# Patient Record
Sex: Female | Born: 1990
Health system: Southern US, Community
[De-identification: ages and names within clinical notes are randomized; demographics above are authoritative.]

## PROBLEM LIST (undated history)

## (undated) ENCOUNTER — Inpatient Hospital Stay (HOSPITAL_COMMUNITY): Payer: Self-pay

## (undated) ENCOUNTER — Emergency Department: Admission: EM | Payer: Managed Care, Other (non HMO) | Source: Home / Self Care

## (undated) DIAGNOSIS — K5190 Ulcerative colitis, unspecified, without complications: Secondary | ICD-10-CM

## (undated) DIAGNOSIS — M1990 Unspecified osteoarthritis, unspecified site: Secondary | ICD-10-CM

## (undated) DIAGNOSIS — K219 Gastro-esophageal reflux disease without esophagitis: Secondary | ICD-10-CM

## (undated) DIAGNOSIS — Z8759 Personal history of other complications of pregnancy, childbirth and the puerperium: Secondary | ICD-10-CM

## (undated) DIAGNOSIS — J42 Unspecified chronic bronchitis: Secondary | ICD-10-CM

## (undated) DIAGNOSIS — F909 Attention-deficit hyperactivity disorder, unspecified type: Secondary | ICD-10-CM

## (undated) DIAGNOSIS — F4310 Post-traumatic stress disorder, unspecified: Secondary | ICD-10-CM

## (undated) DIAGNOSIS — F319 Bipolar disorder, unspecified: Secondary | ICD-10-CM

## (undated) DIAGNOSIS — D649 Anemia, unspecified: Secondary | ICD-10-CM

## (undated) DIAGNOSIS — J45909 Unspecified asthma, uncomplicated: Secondary | ICD-10-CM

## (undated) DIAGNOSIS — T7840XA Allergy, unspecified, initial encounter: Secondary | ICD-10-CM

## (undated) DIAGNOSIS — F329 Major depressive disorder, single episode, unspecified: Secondary | ICD-10-CM

## (undated) DIAGNOSIS — F419 Anxiety disorder, unspecified: Secondary | ICD-10-CM

## (undated) DIAGNOSIS — F32A Depression, unspecified: Secondary | ICD-10-CM

## (undated) HISTORY — PX: TONSILECTOMY/ADENOIDECTOMY WITH MYRINGOTOMY: SHX6125

## (undated) HISTORY — DX: Anxiety disorder, unspecified: F41.9

## (undated) HISTORY — DX: Personal history of other complications of pregnancy, childbirth and the puerperium: Z87.59

## (undated) HISTORY — DX: Allergy, unspecified, initial encounter: T78.40XA

## (undated) HISTORY — DX: Unspecified osteoarthritis, unspecified site: M19.90

## (undated) HISTORY — PX: TONSILLECTOMY AND ADENOIDECTOMY: SHX28

---
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
## 2008-09-21 ENCOUNTER — Emergency Department: Payer: Self-pay

## 2008-09-22 ENCOUNTER — Emergency Department: Payer: Self-pay | Admitting: Emergency Medicine

## 2009-01-01 ENCOUNTER — Emergency Department: Payer: Self-pay | Admitting: Emergency Medicine

## 2009-03-05 ENCOUNTER — Emergency Department: Payer: Self-pay | Admitting: Emergency Medicine

## 2009-04-17 ENCOUNTER — Inpatient Hospital Stay: Payer: Self-pay | Admitting: Psychiatry

## 2009-05-22 ENCOUNTER — Emergency Department: Payer: Self-pay | Admitting: Emergency Medicine

## 2010-02-13 ENCOUNTER — Emergency Department: Payer: Self-pay | Admitting: Emergency Medicine

## 2010-06-12 ENCOUNTER — Emergency Department: Payer: Self-pay | Admitting: Emergency Medicine

## 2010-07-15 ENCOUNTER — Emergency Department: Payer: Self-pay | Admitting: Emergency Medicine

## 2010-07-18 ENCOUNTER — Emergency Department: Payer: Self-pay | Admitting: Emergency Medicine

## 2010-09-08 ENCOUNTER — Inpatient Hospital Stay: Payer: Self-pay | Admitting: Psychiatry

## 2011-02-03 ENCOUNTER — Emergency Department: Payer: Self-pay | Admitting: Emergency Medicine

## 2012-07-03 ENCOUNTER — Emergency Department: Payer: Self-pay | Admitting: Emergency Medicine

## 2013-09-30 ENCOUNTER — Emergency Department: Payer: Self-pay | Admitting: Emergency Medicine

## 2013-12-26 ENCOUNTER — Emergency Department: Payer: Self-pay | Admitting: Emergency Medicine

## 2013-12-26 LAB — COMPREHENSIVE METABOLIC PANEL
ALBUMIN: 3.4 g/dL (ref 3.4–5.0)
ALK PHOS: 35 U/L — AB
ANION GAP: 7 (ref 7–16)
BUN: 12 mg/dL (ref 7–18)
Bilirubin,Total: 0.3 mg/dL (ref 0.2–1.0)
CHLORIDE: 109 mmol/L — AB (ref 98–107)
CREATININE: 0.48 mg/dL — AB (ref 0.60–1.30)
Calcium, Total: 8.8 mg/dL (ref 8.5–10.1)
Co2: 22 mmol/L (ref 21–32)
EGFR (African American): >60
GLUCOSE: 84 mg/dL (ref 65–99)
Osmolality: 275 (ref 275–301)
POTASSIUM: 4.2 mmol/L (ref 3.5–5.1)
SGOT(AST): 30 U/L (ref 15–37)
SGPT (ALT): 20 U/L (ref 12–78)
Sodium: 138 mmol/L (ref 136–145)
TOTAL PROTEIN: 7.8 g/dL (ref 6.4–8.2)

## 2013-12-26 LAB — LIPASE, BLOOD: Lipase: 133 U/L (ref 73–393)

## 2013-12-26 LAB — CBC
HCT: 38.6 % (ref 35.0–47.0)
HGB: 12.6 g/dL (ref 12.0–16.0)
MCH: 29.7 pg (ref 26.0–34.0)
MCHC: 32.6 g/dL (ref 32.0–36.0)
MCV: 91 fL (ref 80–100)
PLATELETS: 236 10*3/uL (ref 150–440)
RBC: 4.24 10*6/uL (ref 3.80–5.20)
RDW: 12.8 % (ref 11.5–14.5)
WBC: 4.9 10*3/uL (ref 3.6–11.0)

## 2013-12-26 LAB — HCG, QUANTITATIVE, PREGNANCY: Beta Hcg, Quant.: <1 m[IU]/mL — ABNORMAL LOW

## 2014-06-13 ENCOUNTER — Emergency Department: Payer: Self-pay | Admitting: Emergency Medicine

## 2014-06-13 LAB — BASIC METABOLIC PANEL
ANION GAP: 9 (ref 7–16)
BUN: 16 mg/dL (ref 7–18)
CALCIUM: 8.6 mg/dL (ref 8.5–10.1)
CHLORIDE: 110 mmol/L — AB (ref 98–107)
Co2: 22 mmol/L (ref 21–32)
Creatinine: 0.75 mg/dL (ref 0.60–1.30)
EGFR (Non-African Amer.): >60
GLUCOSE: 96 mg/dL (ref 65–99)
Osmolality: 282 (ref 275–301)
POTASSIUM: 3.9 mmol/L (ref 3.5–5.1)
Sodium: 141 mmol/L (ref 136–145)

## 2014-06-13 LAB — CBC WITH DIFFERENTIAL/PLATELET
BASOS PCT: 0.7 %
Basophil #: 0.1 10*3/uL (ref 0.0–0.1)
EOS PCT: 4.1 %
Eosinophil #: 0.3 10*3/uL (ref 0.0–0.7)
HCT: 37.2 % (ref 35.0–47.0)
HGB: 12 g/dL (ref 12.0–16.0)
LYMPHS ABS: 2.5 10*3/uL (ref 1.0–3.6)
Lymphocyte %: 31.4 %
MCH: 30.3 pg (ref 26.0–34.0)
MCHC: 32.3 g/dL (ref 32.0–36.0)
MCV: 94 fL (ref 80–100)
Monocyte #: 0.5 x10 3/mm (ref 0.2–0.9)
Monocyte %: 6.6 %
NEUTROS PCT: 57.2 %
Neutrophil #: 4.5 10*3/uL (ref 1.4–6.5)
PLATELETS: 312 10*3/uL (ref 150–440)
RBC: 3.96 10*6/uL (ref 3.80–5.20)
RDW: 13.3 % (ref 11.5–14.5)
WBC: 7.9 10*3/uL (ref 3.6–11.0)

## 2014-06-13 LAB — URINALYSIS, COMPLETE
BLOOD: NEGATIVE
Bacteria: NONE SEEN
Bilirubin,UR: NEGATIVE
GLUCOSE, UR: NEGATIVE mg/dL (ref 0–75)
KETONE: NEGATIVE
LEUKOCYTE ESTERASE: NEGATIVE
Nitrite: NEGATIVE
PROTEIN: NEGATIVE
Ph: 5 (ref 4.5–8.0)
RBC,UR: NONE SEEN /HPF (ref 0–5)
SPECIFIC GRAVITY: 1.033 (ref 1.003–1.030)
WBC UR: NONE SEEN /HPF (ref 0–5)

## 2014-11-21 ENCOUNTER — Emergency Department: Payer: Self-pay | Admitting: Emergency Medicine

## 2014-11-21 LAB — URINALYSIS, COMPLETE
BILIRUBIN, UR: NEGATIVE
BLOOD: NEGATIVE
GLUCOSE, UR: NEGATIVE mg/dL (ref 0–75)
Ketone: NEGATIVE
Leukocyte Esterase: NEGATIVE
NITRITE: NEGATIVE
Ph: 7 (ref 4.5–8.0)
Protein: NEGATIVE
RBC,UR: 8 /HPF (ref 0–5)
SPECIFIC GRAVITY: 1.025 (ref 1.003–1.030)
Squamous Epithelial: 1

## 2014-11-21 LAB — CBC WITH DIFFERENTIAL/PLATELET
Basophil #: 0 10*3/uL (ref 0.0–0.1)
Basophil %: 0.7 %
EOS ABS: 0.3 10*3/uL (ref 0.0–0.7)
Eosinophil %: 4 %
HCT: 38.5 % (ref 35.0–47.0)
HGB: 13 g/dL (ref 12.0–16.0)
LYMPHS PCT: 29.7 %
Lymphocyte #: 2.2 10*3/uL (ref 1.0–3.6)
MCH: 30.6 pg (ref 26.0–34.0)
MCHC: 33.7 g/dL (ref 32.0–36.0)
MCV: 91 fL (ref 80–100)
MONO ABS: 0.7 x10 3/mm (ref 0.2–0.9)
Monocyte %: 9.3 %
NEUTROS PCT: 56.3 %
Neutrophil #: 4.2 10*3/uL (ref 1.4–6.5)
PLATELETS: 383 10*3/uL (ref 150–440)
RBC: 4.24 10*6/uL (ref 3.80–5.20)
RDW: 13.7 % (ref 11.5–14.5)
WBC: 7.4 10*3/uL (ref 3.6–11.0)

## 2014-11-21 LAB — BASIC METABOLIC PANEL
Anion Gap: 6 — ABNORMAL LOW (ref 7–16)
BUN: 11 mg/dL (ref 7–18)
CALCIUM: 8.9 mg/dL (ref 8.5–10.1)
CREATININE: 0.65 mg/dL (ref 0.60–1.30)
Chloride: 108 mmol/L — ABNORMAL HIGH (ref 98–107)
Co2: 26 mmol/L (ref 21–32)
EGFR (African American): >60
GLUCOSE: 96 mg/dL (ref 65–99)
Osmolality: 279 (ref 275–301)
POTASSIUM: 4 mmol/L (ref 3.5–5.1)
SODIUM: 140 mmol/L (ref 136–145)

## 2014-11-22 ENCOUNTER — Emergency Department: Payer: Self-pay | Admitting: Emergency Medicine

## 2014-12-24 ENCOUNTER — Emergency Department: Payer: Self-pay | Admitting: Emergency Medicine

## 2016-03-23 ENCOUNTER — Emergency Department: Payer: Self-pay

## 2016-03-23 ENCOUNTER — Emergency Department
Admission: EM | Admit: 2016-03-23 | Discharge: 2016-03-23 | Disposition: A | Discharge status: Home / Self Care | Payer: Self-pay | Attending: Emergency Medicine | Admitting: Emergency Medicine

## 2016-03-23 DIAGNOSIS — F329 Major depressive disorder, single episode, unspecified: Secondary | ICD-10-CM | POA: Insufficient documentation

## 2016-03-23 DIAGNOSIS — J45901 Unspecified asthma with (acute) exacerbation: Secondary | ICD-10-CM | POA: Insufficient documentation

## 2016-03-23 HISTORY — DX: Unspecified asthma, uncomplicated: J45.909

## 2016-03-23 HISTORY — DX: Gastro-esophageal reflux disease without esophagitis: K21.9

## 2016-03-23 HISTORY — DX: Depression, unspecified: F32.A

## 2016-03-23 HISTORY — DX: Major depressive disorder, single episode, unspecified: F32.9

## 2016-03-23 LAB — CBC WITH DIFFERENTIAL/PLATELET
BASOS PCT: 1 %
Basophils Absolute: 0.1 10*3/uL (ref 0–0.1)
EOS ABS: 0.5 10*3/uL (ref 0–0.7)
EOS PCT: 5 %
HCT: 36.4 % (ref 35.0–47.0)
Hemoglobin: 12.5 g/dL (ref 12.0–16.0)
Lymphocytes Relative: 35 %
Lymphs Abs: 3 10*3/uL (ref 1.0–3.6)
MCH: 30 pg (ref 26.0–34.0)
MCHC: 34.3 g/dL (ref 32.0–36.0)
MCV: 87.3 fL (ref 80.0–100.0)
MONO ABS: 0.6 10*3/uL (ref 0.2–0.9)
MONOS PCT: 8 %
Neutro Abs: 4.3 10*3/uL (ref 1.4–6.5)
Neutrophils Relative %: 51 %
PLATELETS: 361 10*3/uL (ref 150–440)
RBC: 4.16 MIL/uL (ref 3.80–5.20)
RDW: 14.5 % (ref 11.5–14.5)
WBC: 8.5 10*3/uL (ref 3.6–11.0)

## 2016-03-23 LAB — BASIC METABOLIC PANEL
Anion gap: 8 (ref 5–15)
BUN: 14 mg/dL (ref 6–20)
CALCIUM: 9.2 mg/dL (ref 8.9–10.3)
CO2: 21 mmol/L — AB (ref 22–32)
CREATININE: 0.64 mg/dL (ref 0.44–1.00)
Chloride: 111 mmol/L (ref 101–111)
GFR calc non Af Amer: >60 mL/min (ref >60)
Glucose, Bld: 119 mg/dL — ABNORMAL HIGH (ref 65–99)
Potassium: 3.7 mmol/L (ref 3.5–5.1)
SODIUM: 140 mmol/L (ref 135–145)

## 2016-03-23 MED ORDER — PREDNISONE 20 MG PO TABS: 40.0000 mg | ORAL_TABLET | Freq: Every day | ORAL | Status: DC

## 2016-03-23 MED ORDER — IPRATROPIUM-ALBUTEROL 0.5-2.5 (3) MG/3ML IN SOLN
3.0000 mL | Freq: Once | RESPIRATORY_TRACT | Status: AC
  Administered 2016-03-23: 3 mL via RESPIRATORY_TRACT
  Filled 2016-03-23: qty 3

## 2016-03-23 MED ORDER — IPRATROPIUM-ALBUTEROL 0.5-2.5 (3) MG/3ML IN SOLN
3.0000 mL | Freq: Once | RESPIRATORY_TRACT | Status: AC
  Administered 2016-03-23: 3 mL via RESPIRATORY_TRACT

## 2016-03-23 MED ORDER — METHYLPREDNISOLONE SODIUM SUCC 125 MG IJ SOLR
125.0000 mg | Freq: Once | INTRAMUSCULAR | Status: AC
  Administered 2016-03-23: 125 mg via INTRAVENOUS
  Filled 2016-03-23: qty 2

## 2016-03-23 NOTE — Discharge Instructions (Signed)
Please seek medical attention for any high fevers, chest pain, shortness of breath, change in behavior, persistent vomiting, bloody stool or any other new or concerning symptoms.   Asthma, Acute Bronchospasm Acute bronchospasm caused by asthma is also referred to as an asthma attack. Bronchospasm means your air passages become narrowed. The narrowing is caused by inflammation and tightening of the muscles in the air tubes (bronchi) in your lungs. This can make it hard to breathe or cause you to wheeze and cough. CAUSES Possible triggers are:  Animal dander from the skin, hair, or feathers of animals.  Dust mites contained in house dust.  Cockroaches.  Pollen from trees or grass.  Mold.  Cigarette or tobacco smoke.  Air pollutants such as dust, household cleaners, hair sprays, aerosol sprays, paint fumes, strong chemicals, or strong odors.  Cold air or weather changes. Cold air may trigger inflammation. Winds increase molds and pollens in the air.  Strong emotions such as crying or laughing hard.  Stress.  Certain medicines such as aspirin or beta-blockers.  Sulfites in foods and drinks, such as dried fruits and wine.  Infections or inflammatory conditions, such as a flu, cold, or inflammation of the nasal membranes (rhinitis).  Gastroesophageal reflux disease (GERD). GERD is a condition where stomach acid backs up into your esophagus.  Exercise or strenuous activity. SIGNS AND SYMPTOMS   Wheezing.  Excessive coughing, particularly at night.  Chest tightness.  Shortness of breath. DIAGNOSIS  Your health care provider will ask you about your medical history and perform a physical exam. A chest X-ray or blood testing may be performed to look for other causes of your symptoms or other conditions that may have triggered your asthma attack. TREATMENT  Treatment is aimed at reducing inflammation and opening up the airways in your lungs. Most asthma attacks are treated with  inhaled medicines. These include quick relief or rescue medicines (such as bronchodilators) and controller medicines (such as inhaled corticosteroids). These medicines are sometimes given through an inhaler or a nebulizer. Systemic steroid medicine taken by mouth or given through an IV tube also can be used to reduce the inflammation when an attack is moderate or severe. Antibiotic medicines are only used if a bacterial infection is present.  HOME CARE INSTRUCTIONS   Rest.  Drink plenty of liquids. This helps the mucus to remain thin and be easily coughed up. Only use caffeine in moderation and do not use alcohol until you have recovered from your illness.  Do not smoke. Avoid being exposed to secondhand smoke.  You play a critical role in keeping yourself in good health. Avoid exposure to things that cause you to wheeze or to have breathing problems.  Keep your medicines up-to-date and available. Carefully follow your health care provider's treatment plan.  Take your medicine exactly as prescribed.  When pollen or pollution is bad, keep windows closed and use an air conditioner or go to places with air conditioning.  Asthma requires careful medical care. See your health care provider for a follow-up as advised. If you are more than [redacted] weeks pregnant and you were prescribed any new medicines, let your obstetrician know about the visit and how you are doing. Follow up with your health care provider as directed.  After you have recovered from your asthma attack, make an appointment with your outpatient doctor to talk about ways to reduce the likelihood of future attacks. If you do not have a doctor who manages your asthma, make an appointment with  a primary care doctor to discuss your asthma. SEEK IMMEDIATE MEDICAL CARE IF:   You are getting worse.  You have trouble breathing. If severe, call your local emergency services (911 in the [place]  You develop chest pain or discomfort.  You are  vomiting.  You are not able to keep fluids down.  You are coughing up yellow, green, brown, or bloody sputum.  You have a fever and your symptoms suddenly get worse.  You have trouble swallowing. MAKE SURE YOU:   Understand these instructions.  Will watch your condition.  Will get help right away if you are not doing well or get worse.   This information is not intended to replace advice given to you by your health care provider. Make sure you discuss any questions you have with your health care provider.   Document Released: 01/19/2007 Document Revised: 10/09/2013 Document Reviewed: 04/11/2013 Elsevier Interactive Patient Education [employer].
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
## 2016-03-23 NOTE — ED Provider Notes (Signed)
[facility] Emergency Department Provider Note    ____________________________________________  Time seen: ~On EMS arrival  I have reviewed the triage vital signs and the nursing notes.   HISTORY  Chief Complaint Shortness of Breath   History limited by: Not Limited   HPI Sydney Deleon is a 25 y.o. female with history of asthma who presents to the emergency department today via EMS because of concerns for shortness of breath. Patient states that she started having difficulty breathing yesterday. She took her albuterol inhaler and it did seem to improve. Tonight at home she felt like the breathing was getting worse. Her inhaler did not help. She denies any fevers. Denies any culprit she does also feel that her throat is somewhat swollen.   Past Medical History  Diagnosis Date  . Asthma   . Acid reflux   . Depression     There are no active problems to display for this patient.   Past Surgical History  Procedure Laterality Date  . Tonsilectomy/adenoidectomy with myringotomy      No current outpatient prescriptions on file.  Allergies Review of patient's allergies indicates no known allergies.  No family history on file.  Social History Social History  Substance Use Topics  . Smoking status: Never Smoker   . Smokeless tobacco: Not on file  . Alcohol Use: No    Review of Systems  Constitutional: Negative for fever. Cardiovascular: Negative for chest pain. Respiratory: Positive for shortness of breath. Gastrointestinal: Negative for abdominal pain, vomiting and diarrhea. Neurological: Negative for headaches, focal weakness or numbness.  10-point ROS otherwise negative.  ____________________________________________   PHYSICAL EXAM:  VITAL SIGNS:   84  18  129/75 mmHg  100 %    Constitutional: Alert and oriented. Well appearing and in no distress. Eyes: Conjunctivae are normal. PERRL. Normal extraocular movements. ENT    Head: Normocephalic and atraumatic.   Nose: No congestion/rhinnorhea.   Mouth/Throat: Mucous membranes are moist.   Neck: No stridor. Hematological/Lymphatic/Immunilogical: No cervical lymphadenopathy. Cardiovascular: Normal rate, regular rhythm.  No murmurs, rubs, or gallops. Respiratory: Normal respiratory effort without tachypnea nor retractions. Breath sounds are clear and equal bilaterally. No wheezes/rales/rhonchi. Slightly prolonged expiratory phase.  Gastrointestinal: Soft and nontender. No distention.  Genitourinary: Deferred Musculoskeletal: Normal range of motion in all extremities. No joint effusions.  No lower extremity tenderness nor edema. Neurologic:  Normal speech and language. No gross focal neurologic deficits are appreciated.  Skin:  Skin is warm, dry and intact. No rash noted. Psychiatric: Mood and affect are normal. Speech and behavior are normal. Patient exhibits appropriate insight and judgment.  ____________________________________________    LABS (pertinent positives/negatives)  Labs Reviewed  BASIC METABOLIC PANEL - Abnormal; Notable for the following:    CO2 21 (*)    Glucose, Bld 119 (*)    All other components within normal limits  CBC WITH DIFFERENTIAL/PLATELET    ____________________________________________   EKG  I, Nance Pear, attending physician, personally viewed and interpreted this EKG  EKG Time: 2132 Rate: 75 Rhythm: normal sinus rhythm Axis: normal Intervals: qtc 428 QRS: narrow ST changes: no st elevation Impression: normal ekg   ____________________________________________    RADIOLOGY  CXR  IMPRESSION: No active cardiopulmonary disease.   ____________________________________________   PROCEDURES  Procedure(s) performed: None  Critical Care performed: No  ____________________________________________   INITIAL IMPRESSION / ASSESSMENT AND PLAN / ED COURSE  Pertinent labs & imaging results that  were available during my care of the patient were reviewed  by me and considered in my medical decision making (see chart for details).  Patient presented to the emergency department today because of concerns for Ernest breath and asthma exacerbation. Patient was given DuoNeb treatments and Solu-Medrol. She stated she did feel much improved in terms of her breathing. Initially she had some thoughts that her throat seemed to be swelling although she feels much better after the steroids. Will plan on giving the patient prescription for steroids. She does have albuterol inhaler.  ____________________________________________   FINAL CLINICAL IMPRESSION(S) / ED DIAGNOSES  Final diagnoses:  Asthma, unspecified asthma severity, with acute exacerbation     Note: This dictation was prepared with Dragon dictation. Any transcriptional errors that result from this process are unintentional    Nance Pear, MD 03/23/16 2241

## 2016-06-04 ENCOUNTER — Encounter: Payer: Self-pay | Admitting: Emergency Medicine

## 2016-06-04 ENCOUNTER — Emergency Department
Admission: EM | Admit: 2016-06-04 | Discharge: 2016-06-04 | Disposition: A | Discharge status: Home / Self Care | Payer: Medicaid Other | Attending: Emergency Medicine | Admitting: Emergency Medicine

## 2016-06-04 DIAGNOSIS — R110 Nausea: Secondary | ICD-10-CM

## 2016-06-04 DIAGNOSIS — R42 Dizziness and giddiness: Secondary | ICD-10-CM

## 2016-06-04 DIAGNOSIS — J45909 Unspecified asthma, uncomplicated: Secondary | ICD-10-CM | POA: Insufficient documentation

## 2016-06-04 DIAGNOSIS — E869 Volume depletion, unspecified: Secondary | ICD-10-CM | POA: Insufficient documentation

## 2016-06-04 LAB — BASIC METABOLIC PANEL
Anion gap: 5 (ref 5–15)
BUN: 10 mg/dL (ref 6–20)
CHLORIDE: 109 mmol/L (ref 101–111)
CO2: 26 mmol/L (ref 22–32)
CREATININE: 0.59 mg/dL (ref 0.44–1.00)
Calcium: 9 mg/dL (ref 8.9–10.3)
Glucose, Bld: 88 mg/dL (ref 65–99)
POTASSIUM: 3.5 mmol/L (ref 3.5–5.1)
SODIUM: 140 mmol/L (ref 135–145)

## 2016-06-04 LAB — CBC
HEMATOCRIT: 35 % (ref 35.0–47.0)
Hemoglobin: 12.1 g/dL (ref 12.0–16.0)
MCH: 30.5 pg (ref 26.0–34.0)
MCHC: 34.5 g/dL (ref 32.0–36.0)
MCV: 88.2 fL (ref 80.0–100.0)
PLATELETS: 337 10*3/uL (ref 150–440)
RBC: 3.96 MIL/uL (ref 3.80–5.20)
RDW: 14.1 % (ref 11.5–14.5)
WBC: 6.8 10*3/uL (ref 3.6–11.0)

## 2016-06-04 LAB — URINALYSIS COMPLETE WITH MICROSCOPIC (ARMC ONLY)
BILIRUBIN URINE: NEGATIVE
Bacteria, UA: NONE SEEN
Glucose, UA: NEGATIVE mg/dL
KETONES UR: NEGATIVE mg/dL
LEUKOCYTES UA: NEGATIVE
NITRITE: NEGATIVE
PH: 5 (ref 5.0–8.0)
PROTEIN: 30 mg/dL — AB
Specific Gravity, Urine: 1.028 (ref 1.005–1.030)

## 2016-06-04 LAB — CK: Total CK: 93 U/L (ref 38–234)

## 2016-06-04 MED ORDER — SODIUM CHLORIDE 0.9 % IV BOLUS (SEPSIS)
1000.0000 mL | INTRAVENOUS | Status: AC
  Administered 2016-06-04: 1000 mL via INTRAVENOUS

## 2016-06-04 MED ORDER — ONDANSETRON HCL 4 MG/2ML IJ SOLN
4.0000 mg | INTRAMUSCULAR | Status: AC
  Administered 2016-06-04: 4 mg via INTRAVENOUS
  Filled 2016-06-04: qty 2

## 2016-06-04 MED ORDER — ONDANSETRON HCL 4 MG PO TABS: ORAL_TABLET | ORAL | 0 refills | Status: DC

## 2016-06-04 NOTE — ED Triage Notes (Signed)
Pt to ed with c/o dizziness,  States that while walking to work yesterday she began to feel "light headed".  Pt states she vomited x 2 at work yesterday.  Pt reports vomited again today and dizziness continues.  Pt denies chest pain, denies abd pain, reports diarrhea since yesterday. Denies blurred vision.

## 2016-06-04 NOTE — ED Provider Notes (Signed)
[facility] Emergency Department Provider Note  ____________________________________________   First MD Initiated Contact with Patient 06/04/16 1516     (approximate)  I have reviewed the triage vital signs and the nursing notes.   HISTORY  Chief Complaint Dizziness    HPI Sydney Deleon is a 25 y.o. female with no significant past medical history who presents with chief complaint of lightheadedness and dizziness over the last couple of days as well as a couple of episodes of vomiting.  She states that she has been walking back and forth to work and the very high heat or the last 2 days and that seems to make the symptoms worse.  She is felt to drained of energy.  She denies any focal numbness or weakness in her extremities.  She denies headache, shortness of breath, chest pain, abdominal pain, dysuria.  She has not sustained any traumatic injury.  She has not had any change in her vision.  Nothing in particular makes her symptoms better and exertion makes it worse.   Past Medical History:  Diagnosis Date  . Acid reflux   . Asthma   . Depression     There are no active problems to display for this patient.   Past Surgical History:  Procedure Laterality Date  . TONSILECTOMY/ADENOIDECTOMY WITH MYRINGOTOMY      Prior to Admission medications   Medication Sig Start Date End Date Taking? Authorizing Provider  ondansetron (ZOFRAN) 4 MG tablet Take 1-2 tabs by mouth every 8 hours as needed for nausea/vomiting 06/04/16   Hinda Kehr, MD  predniSONE (DELTASONE) 20 MG tablet Take 2 tablets (40 mg total) by mouth daily. 03/23/16   Nance Pear, MD    Allergies Review of patient's allergies indicates no known allergies.  History reviewed. No pertinent family history.  Social History Social History  Substance Use Topics  . Smoking status: Never Smoker  . Smokeless tobacco: Never Used  . Alcohol use No    Review of Systems Constitutional: No  fever/chills Eyes: No visual changes. ENT: No sore throat. Cardiovascular: Denies chest pain. Respiratory: Denies shortness of breath. Gastrointestinal: No abdominal pain.  2 episodes of emesis.  No diarrhea.  No constipation. Genitourinary: Negative for dysuria. Musculoskeletal: Negative for back pain. Skin: Negative for rash. Neurological: Negative for headaches, focal weakness or numbness.  Generalized lightheadedness/dizziness  10-point ROS otherwise negative.  ____________________________________________   PHYSICAL EXAM:  VITAL SIGNS: ED Triage Vitals [06/04/16 1155]  Enc Vitals Group     BP 117/73     Pulse Rate 71     Resp 20     Temp 97.9 F (36.6 C)     Temp Source Oral     SpO2 98 %     Weight 235 lb (106.6 kg)     Height 5' 5"  (1.651 m)     Head Circumference      Peak Flow      Pain Score 0     Pain Loc      Pain Edu?      Excl. in [place]?     Constitutional: Alert and oriented. Well appearing and in no acute distress. Eyes: Conjunctivae are normal. PERRL. EOMI. Head: Atraumatic. Nose: No congestion/rhinnorhea. Mouth/Throat: Mucous membranes are moist.  Oropharynx non-erythematous. Neck: No stridor.  No meningeal signs.   Cardiovascular: Normal rate, regular rhythm. Good peripheral circulation. Grossly normal heart sounds.  Respiratory: Normal respiratory effort.  No retractions. Lungs CTAB. Gastrointestinal: Soft and nontender. No distention.  Musculoskeletal: No lower extremity tenderness nor edema. No gross deformities of extremities. Neurologic:  Normal speech and language. No gross focal neurologic deficits are appreciated.  Skin:  Skin is warm, dry and intact. No rash noted. Psychiatric: Mood and affect are normal. Speech and behavior are normal.  ____________________________________________   LABS (all labs ordered are listed, but only abnormal results are displayed)  Labs Reviewed  URINALYSIS COMPLETEWITH MICROSCOPIC ([place]) - Abnormal;  Notable for the following:       Result Value   Color, Urine YELLOW (*)    APPearance CLEAR (*)    Hgb urine dipstick 1+ (*)    Protein, ur 30 (*)    Squamous Epithelial / LPF 0-5 (*)    All other components within normal limits  BASIC METABOLIC PANEL  CBC  CK   ____________________________________________  EKG  ED ECG REPORT I, Syndey Jaskolski, the attending physician, personally viewed and interpreted this ECG.  Date: 06/04/2016 EKG Time: 12:00 Rate: 66 Rhythm: normal sinus rhythm QRS Axis: normal Intervals: normal ST/T Wave abnormalities: Inverted T-wave in lead 3 and aVF, otherwise unremarkable Conduction Disturbances: none Narrative Interpretation: No evidence of acute ischemia  ____________________________________________  RADIOLOGY   No results found.  ____________________________________________   PROCEDURES  Procedure(s) performed:   Procedures   Critical Care performed: No ____________________________________________   INITIAL IMPRESSION / ASSESSMENT AND PLAN / ED COURSE  Pertinent labs & imaging results that were available during my care of the patient were reviewed by me and considered in my medical decision making (see chart for details).  Patient has no clinical signs of dehydration and her labs are reassuring.  I added on a CK to make sure there is no indication of rhabdo.  Her EKG is reassuring.  She has no chest pain and no shortness of breath.  Given the probable volume depletion leading to her lightheadedness, I will give her 1 L of fluids and some Zofran for mild persistent nausea.  She pointed out that she will need a work note and I anticipate that she will be appropriate for outpatient follow-up.  Clinical Course  Comment By Time  The patient has received about 500 mL of her bolus and is feeling better.  She has tolerated some orange juice and like some more.  She did eat one package of crackers without any difficulty.  I will discharge  her after she completes her bolus.  Her CK was normal. Hinda Kehr, MD 08/18 1715  Patient feels much better, we will discharge her for outpatient follow-up. Hinda Kehr, MD 08/18 1827    ____________________________________________  FINAL CLINICAL IMPRESSION(S) / ED DIAGNOSES  Final diagnoses:  Dizziness  Volume depletion  Nausea     MEDICATIONS GIVEN DURING THIS VISIT:  Medications  sodium chloride 0.9 % bolus 1,000 mL (1,000 mLs Intravenous New Bag/Given 06/04/16 1542)  ondansetron (ZOFRAN) injection 4 mg (4 mg Intravenous Given 06/04/16 1542)     NEW OUTPATIENT MEDICATIONS STARTED DURING THIS VISIT:  New Prescriptions   ONDANSETRON (ZOFRAN) 4 MG TABLET    Take 1-2 tabs by mouth every 8 hours as needed for nausea/vomiting      Note:  This document was prepared using Dragon voice recognition software and may include unintentional dictation errors.    Hinda Kehr, MD 06/04/16 [phone]

## 2016-06-04 NOTE — ED Notes (Signed)
Pt verbalized understanding of discharge instructions. NAD at this time. 

## 2016-06-04 NOTE — ED Triage Notes (Signed)
Pt thinks she might be dehyrdrated in that she has been walking to and from work the past few days.

## 2016-06-04 NOTE — Discharge Instructions (Signed)
You have been seen today in the Emergency Department (ED)  for lightheadedness, nearly passing out, and nausea.  Your workup including labs and EKG show reassuring results.  Your symptoms may be due to dehydration, so it is important that you drink plenty of non-alcoholic fluids.  Please call your regular doctor as soon as possible to schedule the next available clinic appointment to follow up with him/her regarding your visit to the ED and your symptoms.  Return to the Emergency Department (ED)  if you have any further syncopal episodes (pass out again) or develop ANY chest pain, pressure, tightness, trouble breathing, sudden sweating, or other symptoms that concern you.

## 2016-08-04 ENCOUNTER — Emergency Department
Admission: EM | Admit: 2016-08-04 | Discharge: 2016-08-04 | Disposition: A | Discharge status: Home / Self Care | Payer: Medicaid Other | Attending: Emergency Medicine | Admitting: Emergency Medicine

## 2016-08-04 ENCOUNTER — Encounter: Payer: Self-pay | Admitting: *Deleted

## 2016-08-04 DIAGNOSIS — J45909 Unspecified asthma, uncomplicated: Secondary | ICD-10-CM | POA: Insufficient documentation

## 2016-08-04 DIAGNOSIS — R197 Diarrhea, unspecified: Secondary | ICD-10-CM | POA: Insufficient documentation

## 2016-08-04 DIAGNOSIS — Z79899 Other long term (current) drug therapy: Secondary | ICD-10-CM | POA: Insufficient documentation

## 2016-08-04 DIAGNOSIS — R110 Nausea: Secondary | ICD-10-CM

## 2016-08-04 LAB — URINALYSIS COMPLETE WITH MICROSCOPIC (ARMC ONLY)
BACTERIA UA: NONE SEEN
BILIRUBIN URINE: NEGATIVE
GLUCOSE, UA: NEGATIVE mg/dL
Ketones, ur: NEGATIVE mg/dL
LEUKOCYTES UA: NEGATIVE
NITRITE: NEGATIVE
Protein, ur: NEGATIVE mg/dL
SPECIFIC GRAVITY, URINE: 1.015 (ref 1.005–1.030)
Squamous Epithelial / LPF: NONE SEEN
pH: 6 (ref 5.0–8.0)

## 2016-08-04 LAB — COMPREHENSIVE METABOLIC PANEL
ALT: 11 U/L — ABNORMAL LOW (ref 14–54)
AST: 13 U/L — AB (ref 15–41)
Albumin: 3.8 g/dL (ref 3.5–5.0)
Alkaline Phosphatase: 40 U/L (ref 38–126)
Anion gap: 7 (ref 5–15)
BILIRUBIN TOTAL: 0.2 mg/dL — AB (ref 0.3–1.2)
BUN: 10 mg/dL (ref 6–20)
CO2: 23 mmol/L (ref 22–32)
Calcium: 9.1 mg/dL (ref 8.9–10.3)
Chloride: 109 mmol/L (ref 101–111)
Creatinine, Ser: 0.72 mg/dL (ref 0.44–1.00)
Glucose, Bld: 95 mg/dL (ref 65–99)
POTASSIUM: 3.7 mmol/L (ref 3.5–5.1)
Sodium: 139 mmol/L (ref 135–145)
TOTAL PROTEIN: 7.8 g/dL (ref 6.5–8.1)

## 2016-08-04 LAB — CBC
HEMATOCRIT: 35.1 % (ref 35.0–47.0)
Hemoglobin: 12.3 g/dL (ref 12.0–16.0)
MCH: 31 pg (ref 26.0–34.0)
MCHC: 35 g/dL (ref 32.0–36.0)
MCV: 88.5 fL (ref 80.0–100.0)
PLATELETS: 355 10*3/uL (ref 150–440)
RBC: 3.96 MIL/uL (ref 3.80–5.20)
RDW: 14 % (ref 11.5–14.5)
WBC: 7 10*3/uL (ref 3.6–11.0)

## 2016-08-04 LAB — LIPASE, BLOOD: Lipase: 24 U/L (ref 11–51)

## 2016-08-04 MED ORDER — ONDANSETRON 4 MG PO TBDP
ORAL_TABLET | ORAL | Status: AC
Start: 2016-08-04 — End: 2016-08-04
  Administered 2016-08-04: 4 mg via ORAL
  Filled 2016-08-04: qty 1

## 2016-08-04 MED ORDER — DIPHENOXYLATE-ATROPINE 2.5-0.025 MG PO TABS
1.0000 | ORAL_TABLET | Freq: Once | ORAL | Status: AC
  Administered 2016-08-04: 1 via ORAL

## 2016-08-04 MED ORDER — ONDANSETRON 4 MG PO TBDP: 4.0000 mg | ORAL_TABLET | Freq: Three times a day (TID) | ORAL | 0 refills | Status: DC | PRN

## 2016-08-04 MED ORDER — LOPERAMIDE HCL 2 MG PO TABS: 2.0000 mg | ORAL_TABLET | Freq: Four times a day (QID) | ORAL | 0 refills | Status: DC | PRN

## 2016-08-04 MED ORDER — DIPHENOXYLATE-ATROPINE 2.5-0.025 MG PO TABS
ORAL_TABLET | ORAL | Status: AC
  Administered 2016-08-04: 1 via ORAL
  Filled 2016-08-04: qty 1

## 2016-08-04 MED ORDER — ONDANSETRON 4 MG PO TBDP
4.0000 mg | ORAL_TABLET | Freq: Once | ORAL | Status: AC
  Administered 2016-08-04: 4 mg via ORAL

## 2016-08-04 NOTE — ED Triage Notes (Signed)
States diarrhea, muscle aches, a headache, and nausea for 1 week, pt awake and alert in no acute distress

## 2016-08-04 NOTE — ED Provider Notes (Signed)
[facility] Emergency Department Provider Note  ____________________________________________  Time seen: Approximately 8:39 PM  I have reviewed the triage vital signs and the nursing notes.   HISTORY  Chief Complaint Diarrhea and Generalized Body Aches    HPI Sydney Deleon is a 25 y.o. female, otherwise healthy, presenting with 1 week of watery diarrhea and nausea without vomiting. The patient reports at least 4 daily episodes of watery nonbloody diarrhea. She has not had any abdominal pain, fever, sick contacts she has not traveled outside denies states. No camping. He has not tried any medication for her symptoms.    Past Medical History:  Diagnosis Date  . Acid reflux   . Asthma   . Depression     There are no active problems to display for this patient.   Past Surgical History:  Procedure Laterality Date  . TONSILECTOMY/ADENOIDECTOMY WITH MYRINGOTOMY      Current Outpatient Rx  . Order #: [id number] Class: Print  . Order #: [id number] Class: Print  . Order #: [id number] Class: Print  . Order #: [id number] Class: Print    Allergies Review of patient's allergies indicates no known allergies.  History reviewed. No pertinent family history.  Social History Social History  Substance Use Topics  . Smoking status: Never Smoker  . Smokeless tobacco: Never Used  . Alcohol use No    Review of Systems Constitutional: No fever/chills.No lightheadedness or fainting. Eyes: No visual changes. ENT: No sore throat. No congestion or rhinorrhea. Cardiovascular: Denies chest pain. Denies palpitations. Respiratory: Denies shortness of breath.  No cough. Gastrointestinal: No abdominal pain.  Positive nausea, no vomiting.  Positive watery nonbloody diarrhea.  No constipation. Genitourinary: Negative for dysuria. Musculoskeletal: Negative for back pain. Skin: Negative for rash. Neurological: Negative for headaches. No focal numbness, tingling or  weakness.   10-point ROS otherwise negative.  ____________________________________________   PHYSICAL EXAM:  VITAL SIGNS: ED Triage Vitals  Enc Vitals Group     BP 08/04/16 1821 117/69     Pulse Rate 08/04/16 1821 66     Resp 08/04/16 1821 18     Temp 08/04/16 1821 98.4 F (36.9 C)     Temp Source 08/04/16 1821 Oral     SpO2 08/04/16 1821 100 %     Weight 08/04/16 1821 215 lb (97.5 kg)     Height 08/04/16 1821 5' 5"  (1.651 m)     Head Circumference --      Peak Flow --      Pain Score 08/04/16 1824 10     Pain Loc --      Pain Edu? --      Excl. in [place]? --     Constitutional: Alert and oriented. Well appearing and in no acute distress. Answers questions appropriately. Eyes: Conjunctivae are normal.  EOMI. No scleral icterus. Head: Atraumatic. Nose: No congestion/rhinnorhea. Mouth/Throat: Mucous membranes are moist.  Neck: No stridor.  Supple.   Cardiovascular: Normal rate, regular rhythm. No murmurs, rubs or gallops.  Respiratory: Normal respiratory effort.  No accessory muscle use or retractions. Lungs CTAB.  No wheezes, rales or ronchi. Gastrointestinal: Obese. Soft, nontender and nondistended.  No guarding or rebound.  No peritoneal signs. Musculoskeletal: No LE edema.  Neurologic:  A&Ox3.  Speech is clear.  Face and smile are symmetric.  EOMI.  Moves all extremities well. Skin:  Skin is warm, dry and intact. No rash noted. Psychiatric: Mood and affect are normal. Speech and behavior are normal.  Normal judgement.  ____________________________________________  LABS (all labs ordered are listed, but only abnormal results are displayed)  Labs Reviewed  COMPREHENSIVE METABOLIC PANEL - Abnormal; Notable for the following:       Result Value   AST 13 (*)    ALT 11 (*)    Total Bilirubin 0.2 (*)    All other components within normal limits  URINALYSIS COMPLETEWITH MICROSCOPIC ([facility] ONLY) - Abnormal; Notable for the following:    Color, Urine YELLOW (*)     APPearance CLEAR (*)    Hgb urine dipstick 2+ (*)    All other components within normal limits  LIPASE, BLOOD  CBC  POC URINE PREG, ED   ____________________________________________  EKG  Not indicated ____________________________________________  RADIOLOGY  No results found.  ____________________________________________   PROCEDURES  Procedure(s) performed: None  Procedures  Critical Care performed: No ____________________________________________   INITIAL IMPRESSION / ASSESSMENT AND PLAN / ED COURSE  Pertinent labs & imaging results that were available during my care of the patient were reviewed by me and considered in my medical decision making (see chart for details).  25 y.o. female presenting with 1 week of watery nonbloody diarrhea and nausea without any abdominal pain, fever, or other red flags. Overall, the patient is well-appearing with stable vital signs per there are no focal abnormalities on her abdominal examination obese suggestive of an acute intra-abdominal surgical process or severe infectious etiology. It is likely that she has a viral GI illness or foodborne illness area did her laboratory studies are also reassuring. At this time, we'll treat her symptomatically. I did discuss return precautions with her as well as follow-up instructions.  ____________________________________________  FINAL CLINICAL IMPRESSION(S) / ED DIAGNOSES  Final diagnoses:  Diarrhea, unspecified type  Nausea without vomiting    Clinical Course      NEW MEDICATIONS STARTED DURING THIS VISIT:  New Prescriptions   LOPERAMIDE (IMODIUM A-D) 2 MG TABLET    Take 1 tablet (2 mg total) by mouth 4 (four) times daily as needed for diarrhea or loose stools.   ONDANSETRON (ZOFRAN ODT) 4 MG DISINTEGRATING TABLET    Take 1 tablet (4 mg total) by mouth every 8 (eight) hours as needed for nausea or vomiting.      Eula Listen, MD 08/04/16 2042

## 2016-08-04 NOTE — Discharge Instructions (Signed)
Please take the loperamide as needed for diarrhea. Zofran as for nausea. Take a clear liquid diet for the next 24 hours, then advance to a bland BRAT diet as tolerated.  Please make a follow-up appointment with a primary care physician at one of the clinics listed in the work.  Practice frequent and good handwashing to prevent the spread of infection. Do not return to work until you have been symptom free for at least 24 hours without medications.  Return to the emergency department if you develop severe pain, fever, inability to keep down fluids, lightheadedness or fainting, or any other symptoms concerning to you.

## 2016-12-02 ENCOUNTER — Emergency Department
Admission: EM | Admit: 2016-12-02 | Discharge: 2016-12-02 | Disposition: A | Discharge status: Home / Self Care | Payer: Self-pay | Attending: Emergency Medicine | Admitting: Emergency Medicine

## 2016-12-02 ENCOUNTER — Encounter: Payer: Self-pay | Admitting: Emergency Medicine

## 2016-12-02 DIAGNOSIS — R112 Nausea with vomiting, unspecified: Secondary | ICD-10-CM | POA: Insufficient documentation

## 2016-12-02 DIAGNOSIS — R1030 Lower abdominal pain, unspecified: Secondary | ICD-10-CM | POA: Insufficient documentation

## 2016-12-02 DIAGNOSIS — R197 Diarrhea, unspecified: Secondary | ICD-10-CM | POA: Insufficient documentation

## 2016-12-02 DIAGNOSIS — J45909 Unspecified asthma, uncomplicated: Secondary | ICD-10-CM | POA: Insufficient documentation

## 2016-12-02 DIAGNOSIS — Z79899 Other long term (current) drug therapy: Secondary | ICD-10-CM | POA: Insufficient documentation

## 2016-12-02 LAB — COMPREHENSIVE METABOLIC PANEL
ALK PHOS: 42 U/L (ref 38–126)
ALT: 12 U/L — AB (ref 14–54)
AST: 15 U/L (ref 15–41)
Albumin: 4.2 g/dL (ref 3.5–5.0)
Anion gap: 8 (ref 5–15)
BUN: 11 mg/dL (ref 6–20)
CALCIUM: 9.2 mg/dL (ref 8.9–10.3)
CHLORIDE: 105 mmol/L (ref 101–111)
CO2: 24 mmol/L (ref 22–32)
CREATININE: 0.73 mg/dL (ref 0.44–1.00)
GFR calc Af Amer: >60 mL/min (ref >60)
GFR calc non Af Amer: >60 mL/min (ref >60)
Glucose, Bld: 80 mg/dL (ref 65–99)
Potassium: 4.4 mmol/L (ref 3.5–5.1)
Sodium: 137 mmol/L (ref 135–145)
Total Bilirubin: 0.7 mg/dL (ref 0.3–1.2)
Total Protein: 8.4 g/dL — ABNORMAL HIGH (ref 6.5–8.1)

## 2016-12-02 LAB — CBC
HCT: 35.1 % (ref 35.0–47.0)
Hemoglobin: 12.2 g/dL (ref 12.0–16.0)
MCH: 30.7 pg (ref 26.0–34.0)
MCHC: 34.9 g/dL (ref 32.0–36.0)
MCV: 88.1 fL (ref 80.0–100.0)
PLATELETS: 376 10*3/uL (ref 150–440)
RBC: 3.99 MIL/uL (ref 3.80–5.20)
RDW: 13.9 % (ref 11.5–14.5)
WBC: 6.4 10*3/uL (ref 3.6–11.0)

## 2016-12-02 LAB — URINALYSIS, COMPLETE (UACMP) WITH MICROSCOPIC
Bacteria, UA: NONE SEEN
Bilirubin Urine: NEGATIVE
Glucose, UA: NEGATIVE mg/dL
Ketones, ur: 80 mg/dL — AB
LEUKOCYTES UA: NEGATIVE
Nitrite: NEGATIVE
PH: 5 (ref 5.0–8.0)
Protein, ur: 30 mg/dL — AB
SPECIFIC GRAVITY, URINE: 1.029 (ref 1.005–1.030)

## 2016-12-02 LAB — POCT PREGNANCY, URINE: Preg Test, Ur: NEGATIVE

## 2016-12-02 LAB — LIPASE, BLOOD: LIPASE: 22 U/L (ref 11–51)

## 2016-12-02 MED ORDER — ONDANSETRON HCL 4 MG PO TABS: 4.0000 mg | ORAL_TABLET | Freq: Three times a day (TID) | ORAL | 0 refills | Status: DC | PRN

## 2016-12-02 NOTE — ED Triage Notes (Signed)
Pt presents with n/v/d for over a week. Unable to keep anything down.

## 2016-12-02 NOTE — Discharge Instructions (Signed)
Please seek medical attention for any high fevers, chest pain, shortness of breath, change in behavior, persistent vomiting, bloody stool or any other new or concerning symptoms.  

## 2016-12-02 NOTE — ED Provider Notes (Signed)
[facility] Emergency Department Provider Note   ____________________________________________   I have reviewed the triage vital signs and the nursing notes.   HISTORY  Chief Complaint Emesis; Nausea; and Diarrhea   History limited by: Not Limited   HPI Sydney Deleon is a 26 y.o. female who presents to the emergency department today because of concern for nausea, vomiting and diarrhea. The symptoms have been present for about one week. Constant. The patient has had some lower abdominal discomfort associated with these symptoms. She has not noticed any blood in the vomit or diarrhea. No fevers. No travel or unusual ingestion.   Past Medical History:  Diagnosis Date  . Acid reflux   . Asthma   . Depression     There are no active problems to display for this patient.   Past Surgical History:  Procedure Laterality Date  . TONSILECTOMY/ADENOIDECTOMY WITH MYRINGOTOMY      Prior to Admission medications   Medication Sig Start Date End Date Taking? Authorizing Provider  loperamide (IMODIUM A-D) 2 MG tablet Take 1 tablet (2 mg total) by mouth 4 (four) times daily as needed for diarrhea or loose stools. 08/04/16   Anne-Caroline Mariea Clonts, MD  ondansetron (ZOFRAN ODT) 4 MG disintegrating tablet Take 1 tablet (4 mg total) by mouth every 8 (eight) hours as needed for nausea or vomiting. 08/04/16   Anne-Caroline Mariea Clonts, MD  ondansetron (ZOFRAN) 4 MG tablet Take 1-2 tabs by mouth every 8 hours as needed for nausea/vomiting 06/04/16   Hinda Kehr, MD  predniSONE (DELTASONE) 20 MG tablet Take 2 tablets (40 mg total) by mouth daily. 03/23/16   Nance Pear, MD    Allergies Patient has no known allergies.  No family history on file.  Social History Social History  Substance Use Topics  . Smoking status: Never Smoker  . Smokeless tobacco: Never Used  . Alcohol use No    Review of Systems  Constitutional: Negative for fever. Cardiovascular: Negative  for chest pain. Respiratory: Negative for shortness of breath. Gastrointestinal: Positive for lower abdominal pain, nausea, vomiting and diarrhea. Neurological: Negative for headaches, focal weakness or numbness.  10-point ROS otherwise negative.  ____________________________________________   PHYSICAL EXAM:  VITAL SIGNS: ED Triage Vitals  Enc Vitals Group     BP 12/02/16 1452 118/68     Pulse Rate 12/02/16 1452 72     Resp 12/02/16 1452 20     Temp 12/02/16 1452 97.6 F (36.4 C)     Temp Source 12/02/16 1452 Oral     SpO2 12/02/16 1452 98 %     Weight 12/02/16 1453 215 lb (97.5 kg)     Height --      Head Circumference --      Peak Flow --      Pain Score 12/02/16 1525 10    Constitutional: Alert and oriented. Well appearing and in no distress. Eyes: Conjunctivae are normal. Normal extraocular movements. ENT   Head: Normocephalic and atraumatic.   Nose: No congestion/rhinnorhea.   Mouth/Throat: Mucous membranes are moist.   Neck: No stridor. Hematological/Lymphatic/Immunilogical: No cervical lymphadenopathy. Cardiovascular: Normal rate, regular rhythm.  No murmurs, rubs, or gallops. Respiratory: Normal respiratory effort without tachypnea nor retractions. Breath sounds are clear and equal bilaterally. No wheezes/rales/rhonchi. Gastrointestinal: Soft and non tender. No rebound. No guarding.  Genitourinary: Deferred Musculoskeletal: Normal range of motion in all extremities. No lower extremity edema. Neurologic:  Normal speech and language. No gross focal neurologic deficits are appreciated.  Skin:  Skin is warm, dry and intact. No rash noted. Psychiatric: Mood and affect are normal. Speech and behavior are normal. Patient exhibits appropriate insight and judgment.  ____________________________________________    LABS (pertinent positives/negatives)  Labs Reviewed  COMPREHENSIVE METABOLIC PANEL - Abnormal; Notable for the following:       Result Value    Total Protein 8.4 (*)    ALT 12 (*)    All other components within normal limits  URINALYSIS, COMPLETE (UACMP) WITH MICROSCOPIC - Abnormal; Notable for the following:    Color, Urine YELLOW (*)    APPearance CLEAR (*)    Hgb urine dipstick SMALL (*)    Ketones, ur 80 (*)    Protein, ur 30 (*)    Squamous Epithelial / LPF 0-5 (*)    All other components within normal limits  LIPASE, BLOOD  CBC  POCT PREGNANCY, URINE     ____________________________________________   EKG  None  ____________________________________________    RADIOLOGY  None  ____________________________________________   PROCEDURES  Procedures  ____________________________________________   INITIAL IMPRESSION / ASSESSMENT AND PLAN / ED COURSE  Pertinent labs & imaging results that were available during my care of the patient were reviewed by me and considered in my medical decision making (see chart for details).  Patient presented to the emergency department today because of concerns for nausea vomiting and diarrhea. Patient initially had a small amount of lower abdominal pain. Physical exam is benign. Abdomen is soft. No tenderness. Blood work without any concerning leukocytosis. Urine without any concerning findings. At this point I think likely gastroenteritis secondary to viral illness. Will discharge home with Zofran.  ____________________________________________   FINAL CLINICAL IMPRESSION(S) / ED DIAGNOSES  Final diagnoses:  Nausea vomiting and diarrhea     Note: This dictation was prepared with Dragon dictation. Any transcriptional errors that result from this process are unintentional     Nance Pear, MD 12/02/16 1800

## 2016-12-18 ENCOUNTER — Encounter: Payer: Self-pay | Admitting: Emergency Medicine

## 2016-12-18 ENCOUNTER — Emergency Department
Admission: EM | Admit: 2016-12-18 | Discharge: 2016-12-18 | Disposition: A | Discharge status: Home / Self Care | Payer: Self-pay | Attending: Emergency Medicine | Admitting: Emergency Medicine

## 2016-12-18 ENCOUNTER — Emergency Department: Payer: Self-pay

## 2016-12-18 DIAGNOSIS — Z79899 Other long term (current) drug therapy: Secondary | ICD-10-CM | POA: Insufficient documentation

## 2016-12-18 DIAGNOSIS — J45909 Unspecified asthma, uncomplicated: Secondary | ICD-10-CM | POA: Insufficient documentation

## 2016-12-18 DIAGNOSIS — R1032 Left lower quadrant pain: Secondary | ICD-10-CM | POA: Insufficient documentation

## 2016-12-18 DIAGNOSIS — R112 Nausea with vomiting, unspecified: Secondary | ICD-10-CM | POA: Insufficient documentation

## 2016-12-18 DIAGNOSIS — R197 Diarrhea, unspecified: Secondary | ICD-10-CM | POA: Insufficient documentation

## 2016-12-18 LAB — URINALYSIS, COMPLETE (UACMP) WITH MICROSCOPIC
BACTERIA UA: NONE SEEN
Bilirubin Urine: NEGATIVE
GLUCOSE, UA: NEGATIVE mg/dL
Ketones, ur: NEGATIVE mg/dL
LEUKOCYTES UA: NEGATIVE
NITRITE: NEGATIVE
Protein, ur: 30 mg/dL — AB
SPECIFIC GRAVITY, URINE: 1.02 (ref 1.005–1.030)
pH: 5 (ref 5.0–8.0)

## 2016-12-18 LAB — TYPE AND SCREEN
ABO/RH(D): A POS
Antibody Screen: NEGATIVE

## 2016-12-18 LAB — COMPREHENSIVE METABOLIC PANEL
ALBUMIN: 3.8 g/dL (ref 3.5–5.0)
ALT: 10 U/L — ABNORMAL LOW (ref 14–54)
ANION GAP: 8 (ref 5–15)
AST: 16 U/L (ref 15–41)
Alkaline Phosphatase: 43 U/L (ref 38–126)
BILIRUBIN TOTAL: 0.3 mg/dL (ref 0.3–1.2)
BUN: 6 mg/dL (ref 6–20)
CALCIUM: 9.1 mg/dL (ref 8.9–10.3)
CO2: 25 mmol/L (ref 22–32)
Chloride: 103 mmol/L (ref 101–111)
Creatinine, Ser: 0.73 mg/dL (ref 0.44–1.00)
GFR calc Af Amer: >60 mL/min (ref >60)
GLUCOSE: 114 mg/dL — AB (ref 65–99)
POTASSIUM: 3.6 mmol/L (ref 3.5–5.1)
Sodium: 136 mmol/L (ref 135–145)
TOTAL PROTEIN: 8.1 g/dL (ref 6.5–8.1)

## 2016-12-18 LAB — CBC
HEMATOCRIT: 35.2 % (ref 35.0–47.0)
HEMOGLOBIN: 12.3 g/dL (ref 12.0–16.0)
MCH: 30.6 pg (ref 26.0–34.0)
MCHC: 35 g/dL (ref 32.0–36.0)
MCV: 87.4 fL (ref 80.0–100.0)
Platelets: 394 10*3/uL (ref 150–440)
RBC: 4.03 MIL/uL (ref 3.80–5.20)
RDW: 13.7 % (ref 11.5–14.5)
WBC: 6.9 10*3/uL (ref 3.6–11.0)

## 2016-12-18 LAB — LIPASE, BLOOD: Lipase: 17 U/L (ref 11–51)

## 2016-12-18 LAB — POCT PREGNANCY, URINE: PREG TEST UR: NEGATIVE

## 2016-12-18 MED ORDER — KETOROLAC TROMETHAMINE 30 MG/ML IJ SOLN
30.0000 mg | Freq: Once | INTRAMUSCULAR | Status: AC
  Administered 2016-12-18: 30 mg via INTRAVENOUS
  Filled 2016-12-18: qty 1

## 2016-12-18 MED ORDER — IOPAMIDOL (ISOVUE-300) INJECTION 61%
100.0000 mL | Freq: Once | INTRAVENOUS | Status: AC | PRN
  Administered 2016-12-18: 100 mL via INTRAVENOUS
  Filled 2016-12-18: qty 100

## 2016-12-18 MED ORDER — IOPAMIDOL (ISOVUE-300) INJECTION 61%
30.0000 mL | Freq: Once | INTRAVENOUS | Status: AC | PRN
  Administered 2016-12-18: 30 mL via ORAL
  Filled 2016-12-18: qty 30

## 2016-12-18 MED ORDER — ONDANSETRON HCL 4 MG/2ML IJ SOLN
4.0000 mg | Freq: Once | INTRAMUSCULAR | Status: AC
  Administered 2016-12-18: 4 mg via INTRAVENOUS
  Filled 2016-12-18: qty 2

## 2016-12-18 MED ORDER — SODIUM CHLORIDE 0.9 % IV BOLUS (SEPSIS)
1000.0000 mL | Freq: Once | INTRAVENOUS | Status: AC
  Administered 2016-12-18: 1000 mL via INTRAVENOUS

## 2016-12-18 MED ORDER — DICYCLOMINE HCL 10 MG PO CAPS: 10.0000 mg | ORAL_CAPSULE | Freq: Four times a day (QID) | ORAL | 0 refills | Status: DC

## 2016-12-18 NOTE — ED Provider Notes (Signed)
-----------------------------------------   3:52 PM on 12/18/2016 -----------------------------------------   Blood pressure 117/68, pulse 79, temperature 97.5 F (36.4 C), temperature source Oral, resp. rate 16, height 5' 5"  (1.651 m), weight 90.7 kg, last menstrual period 12/07/2016, SpO2 98 %.  Assuming care from Dr. Reita Cliche.  In short, Sydney Deleon is a 26 y.o. female with a chief complaint of Diarrhea . Patient was awaiting CT results. CT indicates mesenteric adenopathy and possible colitis.  Refer to the original H&P for additional details.  The current plan of care is to discharge patient with GI follow-up. She was comfortable with plan. Patient was given a work note and prescription for Bentyl.        Laban Emperor, PA-C 12/18/16 1833    Schuyler Amor, MD 12/20/16 [phone]

## 2016-12-18 NOTE — ED Notes (Signed)
Patient returned from CT

## 2016-12-18 NOTE — ED Notes (Addendum)
Pt stating that she has had diarrhea for over a month. Pt stating that she also found some "pinkish fluid" in her BM . Pt stating that her BM have been liquid at times along with some semi-formed. Pt stating BM four to five times a day. Pt stating nausea with no vomiting along with the diarrhea. Pt stating "nothing is staying on my stomach," but denies vomiting. Pt stating that she has not had fevers that she knows of. Pt stating that she has had HA that come and go.

## 2016-12-18 NOTE — ED Triage Notes (Signed)
Pt here with continued complaints for diarrhea and nausea x2 weeks. Was seen here 2 weeks ago and thought she had a stomach bug. Pt reports small amount of blood in stool, reports "pink in color". Pt has been taking immodium and pedialyte without relief. Pt a/o, skin warm and dry.

## 2016-12-18 NOTE — Discharge Instructions (Signed)
Although no certain cause was found, your exam and evaluation are reassuring in the emergency department today.  Return to emergency department immediately for any worsening abdominal pain, black or bloody stool, vomiting blood, dizziness or passing out, or any other symptoms concerning to you.

## 2016-12-18 NOTE — ED Notes (Signed)
Patient completed oral contrast. CT department notified.

## 2016-12-18 NOTE — ED Provider Notes (Signed)
[facility] Emergency Department Provider Note ____________________________________________   I have reviewed the triage vital signs and the triage nursing note.  HISTORY  Chief Complaint Diarrhea   Historian Patient  HPI Sydney Deleon is a 26 y.o. female with a history of acid reflux and asthma, presents saying that she's had about 4 weeks now of nausea vomiting and diarrhea. Diarrhea and nausea seemed to be the most consistent. She has some abdominal cramping which is moderate and mostly in the left side. She states the diarrhea is watery and occasionally has pink flecks in it. Denies fever. States that she was seen and discharged with nausea medicine but she continues to have nausea and trouble with diarrhea anytime she has anything from water to saltine crackers, to anything else that she eats.  She is tearful and upset about persistent symptoms.  No significant travel history or sick exposures or foods.    Past Medical History:  Diagnosis Date  . Acid reflux   . Asthma   . Depression     There are no active problems to display for this patient.   Past Surgical History:  Procedure Laterality Date  . TONSILECTOMY/ADENOIDECTOMY WITH MYRINGOTOMY      Prior to Admission medications   Medication Sig Start Date End Date Taking? Authorizing Provider  loperamide (IMODIUM A-D) 2 MG tablet Take 1 tablet (2 mg total) by mouth 4 (four) times daily as needed for diarrhea or loose stools. 08/04/16   Anne-Caroline Mariea Clonts, MD  ondansetron (ZOFRAN ODT) 4 MG disintegrating tablet Take 1 tablet (4 mg total) by mouth every 8 (eight) hours as needed for nausea or vomiting. 08/04/16   Anne-Caroline Mariea Clonts, MD  ondansetron (ZOFRAN) 4 MG tablet Take 1-2 tabs by mouth every 8 hours as needed for nausea/vomiting 06/04/16   Hinda Kehr, MD  ondansetron (ZOFRAN) 4 MG tablet Take 1 tablet (4 mg total) by mouth every 8 (eight) hours as needed for nausea or vomiting.  12/02/16   Nance Pear, MD  predniSONE (DELTASONE) 20 MG tablet Take 2 tablets (40 mg total) by mouth daily. 03/23/16   Nance Pear, MD    No Known Allergies  No family history on file.  Social History Social History  Substance Use Topics  . Smoking status: Never Smoker  . Smokeless tobacco: Never Used  . Alcohol use No    Review of Systems  Constitutional: Negative for fever. Eyes: Negative for visual changes. ENT: Negative for sore throat. Cardiovascular: Negative for chest pain. Respiratory: Negative for shortness of breath. Gastrointestinal: As per history of present illness Genitourinary: Negative for dysuria.  Denies vaginal bleeding or vaginal discharge. Musculoskeletal: Negative for back pain. Skin: Negative for rash. Neurological: Negative for headache. 10 point Review of Systems otherwise negative ____________________________________________   PHYSICAL EXAM:  VITAL SIGNS: ED Triage Vitals  Enc Vitals Group     BP 12/18/16 1308 117/68     Pulse Rate 12/18/16 1308 79     Resp 12/18/16 1308 16     Temp 12/18/16 1308 97.5 F (36.4 C)     Temp Source 12/18/16 1308 Oral     SpO2 12/18/16 1308 98 %     Weight 12/18/16 1308 200 lb (90.7 kg)     Height 12/18/16 1308 5' 5"  (1.651 m)     Head Circumference --      Peak Flow --      Pain Score 12/18/16 1309 10     Pain Loc --  Pain Edu? --      Excl. in [place]? --      Constitutional: Alert and oriented. Well appearing and in no distress. HEENT   Head: Normocephalic and atraumatic.      Eyes: Conjunctivae are normal. PERRL. Normal extraocular movements.      Ears:         Nose: No congestion/rhinnorhea.   Mouth/Throat: Mucous membranes are moist.   Neck: No stridor. Cardiovascular/Chest: Normal rate, regular rhythm.  No murmurs, rubs, or gallops. Respiratory: Normal respiratory effort without tachypnea nor retractions. Breath sounds are clear and equal bilaterally. No  wheezes/rales/rhonchi. Gastrointestinal: Soft. No distention, no guarding, no rebound. Moderate tenderness diffusely especially left side left lower quadrant.  Genitourinary/rectal:Deferred Musculoskeletal: Nontender with normal range of motion in all extremities. No joint effusions.  No lower extremity tenderness.  No edema. Neurologic:  Normal speech and language. No gross or focal neurologic deficits are appreciated. Skin:  Skin is warm, dry and intact. No rash noted. Psychiatric: Mood and affect are normal. Speech and behavior are normal. Patient exhibits appropriate insight and judgment.   ____________________________________________  LABS (pertinent positives/negatives)  Labs Reviewed  COMPREHENSIVE METABOLIC PANEL - Abnormal; Notable for the following:       Result Value   Glucose, Bld 114 (*)    ALT 10 (*)    All other components within normal limits  URINALYSIS, COMPLETE (UACMP) WITH MICROSCOPIC - Abnormal; Notable for the following:    Color, Urine YELLOW (*)    APPearance CLEAR (*)    Hgb urine dipstick MODERATE (*)    Protein, ur 30 (*)    Squamous Epithelial / LPF 0-5 (*)    All other components within normal limits  LIPASE, BLOOD  CBC  POCT PREGNANCY, URINE  POC URINE PREG, ED  TYPE AND SCREEN    ____________________________________________    EKG I, Lisa Roca, MD, the attending physician have personally viewed and interpreted all ECGs.  None ____________________________________________  RADIOLOGY All Xrays were viewed by me. Imaging interpreted by Radiologist.  CT abdomen pelvis with contrast: Pending __________________________________________  PROCEDURES  Procedure(s) performed: None  Critical Care performed: None  ____________________________________________   ED COURSE / ASSESSMENT AND PLAN  Pertinent labs & imaging results that were available during my care of the patient were reviewed by me and considered in my medical decision making  (see chart for details).   Ms. Boza is very tearful for ongoing symptoms of nausea and diarrhea over about 4 weeks now. Although she's not had any constipation, her symptoms at this point are little concerning for irritable bowel. We had a long discussion and that she does need to follow up with both primary care doctor as well as a gastroenterologist. I suspect colonoscopy is the next most helpful step.  We discussed she has not had imaging of her abdomen. Although laboratory studies are reassuring, we discussed obtaining CT with discussion of risk versus benefit in terms of radiation, and chose to proceed.  Clinically symptoms do not seem to be located in the right upper quadrant not currently concerned about gallstones being the source of her ongoing diarrhea and nausea.  Patient care transferred to [place], [place].  Pending CT.  If negative the patient will be discharged from I prepared discharge instructions.    CONSULTATIONS:   None   Patient / Family / Caregiver informed of clinical course, medical decision-making process, and agree with plan.   I discussed return precautions, follow-up instructions, and discharge instructions with patient  and/or family.   ___________________________________________   FINAL CLINICAL IMPRESSION(S) / ED DIAGNOSES   Final diagnoses:  Nausea vomiting and diarrhea              Note: This dictation was prepared with Dragon dictation. Any transcriptional errors that result from this process are unintentional    Lisa Roca, MD 12/18/16 [phone]

## 2016-12-20 ENCOUNTER — Emergency Department
Admission: EM | Admit: 2016-12-20 | Discharge: 2016-12-20 | Disposition: A | Discharge status: Home / Self Care | Payer: Self-pay | Attending: Emergency Medicine | Admitting: Emergency Medicine

## 2016-12-20 ENCOUNTER — Encounter: Payer: Self-pay | Admitting: Emergency Medicine

## 2016-12-20 ENCOUNTER — Emergency Department: Payer: Self-pay

## 2016-12-20 DIAGNOSIS — R112 Nausea with vomiting, unspecified: Secondary | ICD-10-CM

## 2016-12-20 DIAGNOSIS — R1084 Generalized abdominal pain: Secondary | ICD-10-CM | POA: Insufficient documentation

## 2016-12-20 DIAGNOSIS — J45909 Unspecified asthma, uncomplicated: Secondary | ICD-10-CM | POA: Insufficient documentation

## 2016-12-20 DIAGNOSIS — K920 Hematemesis: Secondary | ICD-10-CM | POA: Insufficient documentation

## 2016-12-20 DIAGNOSIS — Z79899 Other long term (current) drug therapy: Secondary | ICD-10-CM | POA: Insufficient documentation

## 2016-12-20 DIAGNOSIS — R197 Diarrhea, unspecified: Secondary | ICD-10-CM | POA: Insufficient documentation

## 2016-12-20 HISTORY — DX: Bipolar disorder, unspecified: F31.9

## 2016-12-20 HISTORY — DX: Unspecified chronic bronchitis: J42

## 2016-12-20 LAB — LIPASE, BLOOD: LIPASE: 11 U/L (ref 11–51)

## 2016-12-20 LAB — COMPREHENSIVE METABOLIC PANEL
ALBUMIN: 3.6 g/dL (ref 3.5–5.0)
ALT: 7 U/L — AB (ref 14–54)
AST: 13 U/L — AB (ref 15–41)
Alkaline Phosphatase: 43 U/L (ref 38–126)
Anion gap: 8 (ref 5–15)
CHLORIDE: 105 mmol/L (ref 101–111)
CO2: 24 mmol/L (ref 22–32)
CREATININE: 0.8 mg/dL (ref 0.44–1.00)
Calcium: 9.3 mg/dL (ref 8.9–10.3)
GFR calc Af Amer: >60 mL/min (ref >60)
GFR calc non Af Amer: >60 mL/min (ref >60)
GLUCOSE: 93 mg/dL (ref 65–99)
POTASSIUM: 3.7 mmol/L (ref 3.5–5.1)
SODIUM: 137 mmol/L (ref 135–145)
Total Bilirubin: 0.4 mg/dL (ref 0.3–1.2)
Total Protein: 7.8 g/dL (ref 6.5–8.1)

## 2016-12-20 LAB — CBC WITH DIFFERENTIAL/PLATELET
BASOS PCT: 0 %
Basophils Absolute: 0 10*3/uL (ref 0–0.1)
EOS PCT: 4 %
Eosinophils Absolute: 0.3 10*3/uL (ref 0–0.7)
HCT: 36 % (ref 35.0–47.0)
Hemoglobin: 12.3 g/dL (ref 12.0–16.0)
LYMPHS PCT: 18 %
Lymphs Abs: 1.6 10*3/uL (ref 1.0–3.6)
MCH: 29.5 pg (ref 26.0–34.0)
MCHC: 34 g/dL (ref 32.0–36.0)
MCV: 86.7 fL (ref 80.0–100.0)
MONO ABS: 1.1 10*3/uL — AB (ref 0.2–0.9)
Monocytes Relative: 13 %
Neutro Abs: 5.5 10*3/uL (ref 1.4–6.5)
Neutrophils Relative %: 65 %
Platelets: 399 10*3/uL (ref 150–440)
RBC: 4.16 MIL/uL (ref 3.80–5.20)
RDW: 14 % (ref 11.5–14.5)
WBC: 8.5 10*3/uL (ref 3.6–11.0)

## 2016-12-20 LAB — URINALYSIS, COMPLETE (UACMP) WITH MICROSCOPIC
BACTERIA UA: NONE SEEN
Bilirubin Urine: NEGATIVE
GLUCOSE, UA: NEGATIVE mg/dL
KETONES UR: NEGATIVE mg/dL
LEUKOCYTES UA: NEGATIVE
Nitrite: NEGATIVE
PROTEIN: NEGATIVE mg/dL
SQUAMOUS EPITHELIAL / LPF: NONE SEEN
Specific Gravity, Urine: 1.002 — ABNORMAL LOW (ref 1.005–1.030)
WBC UA: NONE SEEN WBC/hpf (ref 0–5)
pH: 7 (ref 5.0–8.0)

## 2016-12-20 MED ORDER — HYDROMORPHONE HCL 1 MG/ML IJ SOLN
1.0000 mg | Freq: Once | INTRAMUSCULAR | Status: AC
  Administered 2016-12-20: 1 mg via INTRAVENOUS
  Filled 2016-12-20: qty 1

## 2016-12-20 MED ORDER — TRAMADOL HCL 50 MG PO TABS: 50.0000 mg | ORAL_TABLET | Freq: Four times a day (QID) | ORAL | 0 refills | Status: DC | PRN

## 2016-12-20 MED ORDER — ONDANSETRON HCL 4 MG/2ML IJ SOLN
4.0000 mg | Freq: Once | INTRAMUSCULAR | Status: AC
  Administered 2016-12-20: 4 mg via INTRAVENOUS
  Filled 2016-12-20: qty 2

## 2016-12-20 MED ORDER — PROMETHAZINE HCL 25 MG PO TABS: 25.0000 mg | ORAL_TABLET | Freq: Four times a day (QID) | ORAL | 0 refills | Status: DC | PRN

## 2016-12-20 MED ORDER — PANTOPRAZOLE SODIUM 40 MG IV SOLR
40.0000 mg | Freq: Once | INTRAVENOUS | Status: AC
  Administered 2016-12-20: 40 mg via INTRAVENOUS
  Filled 2016-12-20: qty 40

## 2016-12-20 NOTE — ED Provider Notes (Signed)
[facility] Emergency Department Provider Note        Time seen: ----------------------------------------- 7:18 AM on 12/20/2016 -----------------------------------------    I have reviewed the triage vital signs and the nursing notes.   HISTORY  Chief Complaint Abdominal Pain    HPI Sydney Deleon is a 26 y.o. female who presents to ER for lower abdominal pain and distention. EMS says the patient has had diarrhea and vomiting for the past several months and last Saturday she was treated here and told she had IBS. Patient was vomiting up blood. She states she's only been eating fruit and drinking water this weekend. She denies fevers, chills or other complaints.   Past Medical History:  Diagnosis Date  . Acid reflux   . Asthma   . Bipolar 1 disorder ([place])   . Chronic bronchitis ([place])   . Depression     There are no active problems to display for this patient.   Past Surgical History:  Procedure Laterality Date  . TONSILECTOMY/ADENOIDECTOMY WITH MYRINGOTOMY      Allergies Patient has no known allergies.  Social History Social History  Substance Use Topics  . Smoking status: Never Smoker  . Smokeless tobacco: Never Used  . Alcohol use No    Review of Systems Constitutional: Negative for fever. Cardiovascular: Negative for chest pain. Respiratory: Negative for shortness of breath. Gastrointestinal: Positive for abdominal pain, vomiting, diarrhea, hematemesis Genitourinary: Negative for dysuria. Musculoskeletal: Negative for back pain. Skin: Negative for rash. Neurological: Negative for headaches, positive for generalized weakness  10-point ROS otherwise negative.  ____________________________________________   PHYSICAL EXAM:  VITAL SIGNS: ED Triage Vitals  Enc Vitals Group     BP 12/20/16 0648 116/90     Pulse Rate 12/20/16 0648 89     Resp 12/20/16 0648 (!) 22     Temp 12/20/16 0648 98.6 F (37 C)     Temp Source  12/20/16 0648 Oral     SpO2 12/20/16 0648 100 %     Weight 12/20/16 0645 200 lb (90.7 kg)     Height 12/20/16 0645 5' 5"  (1.651 m)     Head Circumference --      Peak Flow --      Pain Score --      Pain Loc --      Pain Edu? --      Excl. in [place]? --     Constitutional: Alert and oriented. No distress noted Eyes: Conjunctivae are normal. PERRL. Normal extraocular movements. ENT   Head: Normocephalic and atraumatic.   Nose: No congestion/rhinnorhea.   Mouth/Throat: Mucous membranes are moist.   Neck: No stridor. Cardiovascular: Normal rate, regular rhythm. No murmurs, rubs, or gallops. Respiratory: Normal respiratory effort without tachypnea nor retractions. Breath sounds are clear and equal bilaterally. No wheezes/rales/rhonchi. Gastrointestinal: Diffuse lower abdominal tenderness, no rebound or guarding. Normal bowel sounds. Musculoskeletal: Nontender with normal range of motion in all extremities. No lower extremity tenderness nor edema. Neurologic:  Normal speech and language. No gross focal neurologic deficits are appreciated.  Skin:  Skin is warm, dry and intact. No rash noted. Psychiatric: Depressed mood and affect ____________________________________________  ED COURSE:  Pertinent labs & imaging results that were available during my care of the patient were reviewed by me and considered in my medical decision making (see chart for details). Patient presents to ER in no distress with abdominal pain, vomiting and diarrhea. Patient was recently seen for same. I will review recent labs and CT.  We will recheck basic labs, give IV fluids.   Procedures ____________________________________________   LABS (pertinent positives/negatives)  Labs Reviewed  CBC WITH DIFFERENTIAL/PLATELET - Abnormal; Notable for the following:       Result Value   Monocytes Absolute 1.1 (*)    All other components within normal limits  COMPREHENSIVE METABOLIC PANEL - Abnormal; Notable  for the following:    BUN <5 (*)    AST 13 (*)    ALT 7 (*)    All other components within normal limits  URINALYSIS, COMPLETE (UACMP) WITH MICROSCOPIC - Abnormal; Notable for the following:    Color, Urine STRAW (*)    APPearance CLEAR (*)    Specific Gravity, Urine 1.002 (*)    Hgb urine dipstick SMALL (*)    All other components within normal limits  LIPASE, BLOOD    RADIOLOGY Images were viewed by me  Abdomen 2 view IMPRESSION: Nonobstructive bowel gas pattern. ____________________________________________  FINAL ASSESSMENT AND PLAN  Abdominal pain, vomiting, diarrhea, hematemesis  Plan: Patient with labs and imaging as dictated above. No clear etiology for the patient's symptoms are identified at this time. Labs and imaging are unremarkable. CT scan from 2 days ago shows isolated mesenteric adenitis. She does not meet inpatient criteria at this time.   Earleen Newport, MD   Note: This note was generated in part or whole with voice recognition software. Voice recognition is usually quite accurate but there are transcription errors that can and very often do occur. I apologize for any typographical errors that were not detected and corrected.     Earleen Newport, MD 12/20/16 [phone]

## 2016-12-20 NOTE — ED Triage Notes (Signed)
Pt comes from home with co lower abdominal pain across all quadrants with tenderness and distention.  EMS says patient has had diarrhea/vomiting for the past months and last Saturday was tx here and told she had IBS.  Pt was said to be vomiting up bile and pink emesis tonight.  VS are WNL.  She has only been eating fruit and drinking water this weekend.  Pt appears to be in obvious pain during triage.

## 2016-12-27 ENCOUNTER — Other Ambulatory Visit: Payer: Self-pay

## 2016-12-27 ENCOUNTER — Ambulatory Visit: Payer: Self-pay | Admitting: Gastroenterology

## 2016-12-27 DIAGNOSIS — F329 Major depressive disorder, single episode, unspecified: Secondary | ICD-10-CM | POA: Insufficient documentation

## 2016-12-27 DIAGNOSIS — F32A Depression, unspecified: Secondary | ICD-10-CM

## 2016-12-27 DIAGNOSIS — K219 Gastro-esophageal reflux disease without esophagitis: Secondary | ICD-10-CM | POA: Insufficient documentation

## 2016-12-27 DIAGNOSIS — J45909 Unspecified asthma, uncomplicated: Secondary | ICD-10-CM

## 2016-12-27 DIAGNOSIS — K2100 Gastro-esophageal reflux disease with esophagitis, without bleeding: Secondary | ICD-10-CM

## 2016-12-27 DIAGNOSIS — F319 Bipolar disorder, unspecified: Secondary | ICD-10-CM

## 2016-12-28 ENCOUNTER — Ambulatory Visit: Payer: Self-pay | Admitting: Gastroenterology

## 2016-12-29 ENCOUNTER — Ambulatory Visit: Payer: Self-pay | Admitting: Gastroenterology

## 2017-09-29 ENCOUNTER — Other Ambulatory Visit: Payer: Self-pay

## 2017-09-29 ENCOUNTER — Emergency Department (HOSPITAL_COMMUNITY): Payer: Self-pay

## 2017-09-29 ENCOUNTER — Emergency Department (HOSPITAL_COMMUNITY)
Admission: EM | Admit: 2017-09-29 | Discharge: 2017-09-30 | Disposition: A | Discharge status: Home / Self Care | Payer: Self-pay | Attending: Emergency Medicine | Admitting: Emergency Medicine

## 2017-09-29 DIAGNOSIS — R5383 Other fatigue: Secondary | ICD-10-CM | POA: Insufficient documentation

## 2017-09-29 DIAGNOSIS — R51 Headache: Secondary | ICD-10-CM | POA: Insufficient documentation

## 2017-09-29 DIAGNOSIS — Z79899 Other long term (current) drug therapy: Secondary | ICD-10-CM | POA: Insufficient documentation

## 2017-09-29 DIAGNOSIS — R519 Headache, unspecified: Secondary | ICD-10-CM

## 2017-09-29 DIAGNOSIS — E860 Dehydration: Secondary | ICD-10-CM | POA: Insufficient documentation

## 2017-09-29 DIAGNOSIS — J45909 Unspecified asthma, uncomplicated: Secondary | ICD-10-CM | POA: Insufficient documentation

## 2017-09-29 LAB — COMPREHENSIVE METABOLIC PANEL
ALBUMIN: 3.2 g/dL — AB (ref 3.5–5.0)
ALK PHOS: 43 U/L (ref 38–126)
ALT: 11 U/L — ABNORMAL LOW (ref 14–54)
ANION GAP: 6 (ref 5–15)
AST: 15 U/L (ref 15–41)
BILIRUBIN TOTAL: 0.3 mg/dL (ref 0.3–1.2)
BUN: 8 mg/dL (ref 6–20)
CALCIUM: 8.9 mg/dL (ref 8.9–10.3)
CO2: 23 mmol/L (ref 22–32)
Chloride: 109 mmol/L (ref 101–111)
Creatinine, Ser: 0.71 mg/dL (ref 0.44–1.00)
GLUCOSE: 103 mg/dL — AB (ref 65–99)
Potassium: 3.8 mmol/L (ref 3.5–5.1)
SODIUM: 138 mmol/L (ref 135–145)
TOTAL PROTEIN: 7 g/dL (ref 6.5–8.1)

## 2017-09-29 LAB — URINALYSIS, ROUTINE W REFLEX MICROSCOPIC
BILIRUBIN URINE: NEGATIVE
Glucose, UA: NEGATIVE mg/dL
Hgb urine dipstick: NEGATIVE
KETONES UR: NEGATIVE mg/dL
LEUKOCYTES UA: NEGATIVE
NITRITE: NEGATIVE
Protein, ur: NEGATIVE mg/dL
SPECIFIC GRAVITY, URINE: 1.015 (ref 1.005–1.030)
pH: 8 (ref 5.0–8.0)

## 2017-09-29 LAB — CBC
HCT: 32.1 % — ABNORMAL LOW (ref 36.0–46.0)
HEMOGLOBIN: 10.8 g/dL — AB (ref 12.0–15.0)
MCH: 28.8 pg (ref 26.0–34.0)
MCHC: 33.6 g/dL (ref 30.0–36.0)
MCV: 85.6 fL (ref 78.0–100.0)
Platelets: 362 10*3/uL (ref 150–400)
RBC: 3.75 MIL/uL — ABNORMAL LOW (ref 3.87–5.11)
RDW: 14.2 % (ref 11.5–15.5)
WBC: 7.5 10*3/uL (ref 4.0–10.5)

## 2017-09-29 LAB — I-STAT BETA HCG BLOOD, ED (MC, WL, AP ONLY)

## 2017-09-29 LAB — I-STAT TROPONIN, ED: Troponin i, poc: 0 ng/mL (ref 0.00–0.08)

## 2017-09-29 LAB — LIPASE, BLOOD: LIPASE: 26 U/L (ref 11–51)

## 2017-09-29 MED ORDER — SODIUM CHLORIDE 0.9 % IV BOLUS (SEPSIS)
1000.0000 mL | Freq: Once | INTRAVENOUS | Status: AC
  Administered 2017-09-29: 1000 mL via INTRAVENOUS

## 2017-09-29 NOTE — Discharge Instructions (Signed)
Please take tylenol/motrin for your headache. Continue to eat and drink as tolerated. Follow up with your PCP if your symptoms have not improved in 2-3 days.

## 2017-09-29 NOTE — ED Provider Notes (Signed)
[facility] EMERGENCY DEPARTMENT Provider Note   CSN: [id number] Arrival date & time: 09/29/17  1712     History   Chief Complaint Chief Complaint  Patient presents with  . Chron's Disease flare  . Fatigue  . Diarrhea    HPI Sydney Deleon is a 26 y.o. female.  HPI 26 year old female with a history of bipolar disorder and Crohn's disease who presents with fatigue, lightheadedness and headache.  She states her symptoms started 2 days ago and were mild at first and then progressively worsened.  Headache is a sharp throbbing pain that is not described as worse of life.  Moderate intensity at this time.  It does not radiate.  Denies numbness weakness.  States her symptoms worsen on exertion.  Denies chest pain or shortness of breath.  She just finished a "Crohn's flare" that lasted approximately 5 weeks and ended about 2 days ago.  She had multiple episodes of diarrhea during this episode but has had normal stools over the past 2 days.  He denies any history of cardiac disease.  No history of hypertension, hyperlipidemia or diabetes.  Denies dizziness and does not state that the world is spinning around her.  She states her lightheadedness worsens when she stands up or tries to walk.  Denies pleuritic chest pain. Denies leg pain or swelling. Not on OCPs.  Past Medical History:  Diagnosis Date  . Acid reflux   . Asthma   . Bipolar 1 disorder ([place])   . Chronic bronchitis ([place])   . Depression     Patient Active Problem List   Diagnosis Date Noted  . Depression   . Bipolar 1 disorder ([place])   . Asthma   . Acid reflux     Past Surgical History:  Procedure Laterality Date  . TONSILECTOMY/ADENOIDECTOMY WITH MYRINGOTOMY      OB History    No data available       Home Medications    Prior to Admission medications   Medication Sig Start Date End Date Taking? Authorizing Provider  citalopram (CELEXA) 40 MG tablet Take 40 mg by mouth daily.   Yes  [provider]  loperamide (IMODIUM A-D) 2 MG tablet Take 1 tablet (2 mg total) by mouth 4 (four) times daily as needed for diarrhea or loose stools. 08/04/16  Yes Eula Listen, MD  Magnesium Hydroxide (PHILLIPS MILK OF MAGNESIA PO) Take 1 tablet by mouth daily.   Yes [provider]  ranitidine (ZANTAC) 150 MG tablet Take 150 mg by mouth 2 (two) times daily.   Yes [provider]  dicyclomine (BENTYL) 10 MG capsule Take 1 capsule (10 mg total) by mouth 4 (four) times daily. Patient not taking: Reported on 09/29/2017 12/18/16 09/29/17  Laban Emperor, PA-C  ondansetron (ZOFRAN ODT) 4 MG disintegrating tablet Take 1 tablet (4 mg total) by mouth every 8 (eight) hours as needed for nausea or vomiting. Patient not taking: Reported on 09/29/2017 08/04/16   Eula Listen, MD  ondansetron [facility]) 4 MG tablet Take 1-2 tabs by mouth every 8 hours as needed for nausea/vomiting Patient not taking: Reported on 09/29/2017 06/04/16   Hinda Kehr, MD  ondansetron (ZOFRAN) 4 MG tablet Take 1 tablet (4 mg total) by mouth every 8 (eight) hours as needed for nausea or vomiting. Patient not taking: Reported on 09/29/2017 12/02/16   Nance Pear, MD  predniSONE (DELTASONE) 20 MG tablet Take 2 tablets (40 mg total) by mouth daily. Patient not taking: Reported on  09/29/2017 03/23/16   Nance Pear, MD  promethazine (PHENERGAN) 25 MG tablet Take 1 tablet (25 mg total) by mouth every 6 (six) hours as needed for nausea or vomiting. Patient not taking: Reported on 09/29/2017 12/20/16   Earleen Newport, MD  traMADol (ULTRAM) 50 MG tablet Take 1 tablet (50 mg total) by mouth every 6 (six) hours as needed. Patient not taking: Reported on 09/29/2017 12/20/16 12/20/17  Earleen Newport, MD    Family History No family history on file.  Social History Social History   Tobacco Use  . Smoking status: Never Smoker  . Smokeless tobacco: Never Used  Substance Use Topics  .  Alcohol use: No  . Drug use: No     Allergies   Patient has no known allergies.   Review of Systems Review of Systems  Constitutional: Positive for fatigue. Negative for chills and fever.  HENT: Negative for ear pain and sore throat.   Eyes: Negative for pain and visual disturbance.  Respiratory: Negative for cough and shortness of breath.   Cardiovascular: Negative for chest pain and palpitations.  Gastrointestinal: Negative for abdominal pain and vomiting.  Genitourinary: Negative for dysuria and hematuria.  Musculoskeletal: Negative for arthralgias and back pain.  Skin: Negative for color change and rash.  Neurological: Positive for light-headedness and headaches. Negative for seizures, syncope, weakness and numbness.  All other systems reviewed and are negative.    Physical Exam Updated Vital Signs BP 122/75   Pulse 78   Temp 97.9 F (36.6 C) (Oral)   Resp 16   Ht 5' 5"  (1.651 m)   Wt 99.8 kg (220 lb)   LMP 09/22/2017   SpO2 100%   BMI 36.61 kg/m   Physical Exam  Constitutional: She is oriented to person, place, and time. She appears well-developed and well-nourished. No distress.  HENT:  Head: Normocephalic and atraumatic.  Eyes: Conjunctivae and EOM are normal. Pupils are equal, round, and reactive to light.  Neck: Normal range of motion. Neck supple. No neck rigidity.  Cardiovascular: Normal rate, regular rhythm, normal heart sounds, intact distal pulses and normal pulses.  No murmur heard. Pulmonary/Chest: Effort normal and breath sounds normal. No tachypnea. No respiratory distress. She has no decreased breath sounds. She has no rhonchi.  Abdominal: Soft. There is no tenderness.  Musculoskeletal: She exhibits no edema.  No edema or TTP noted. No signs of DVT.  Neurological: She is alert and oriented to person, place, and time. GCS eye subscore is 4. GCS verbal subscore is 5. GCS motor subscore is 6.  CN 2-12 grossly intact. Sensation intact throughout in  all for extremities. 5/5 strength in flexion and extension of major joints in all four extremities.  Coordination intact to finger-nose-finger and heel shin.  Negative Romberg. Mild unsteadiness gait but no ataxia.  Skin: Skin is warm and dry.  Psychiatric: She has a normal mood and affect.  Nursing note and vitals reviewed.    ED Treatments / Results  Labs (all labs ordered are listed, but only abnormal results are displayed) Labs Reviewed  COMPREHENSIVE METABOLIC PANEL - Abnormal; Notable for the following components:      Result Value   Glucose, Bld 103 (*)    Albumin 3.2 (*)    ALT 11 (*)    All other components within normal limits  CBC - Abnormal; Notable for the following components:   RBC 3.75 (*)    Hemoglobin 10.8 (*)    HCT 32.1 (*)  All other components within normal limits  URINALYSIS, ROUTINE W REFLEX MICROSCOPIC - Abnormal; Notable for the following components:   APPearance CLOUDY (*)    All other components within normal limits  LIPASE, BLOOD  I-STAT BETA HCG BLOOD, ED (MC, WL, AP ONLY)  I-STAT TROPONIN, ED  I-STAT TROPONIN, ED    EKG  EKG Interpretation  Date/Time:  Thursday September 29 2017 17:45:07 EST Ventricular Rate:  70 PR Interval:  144 QRS Duration: 86 QT Interval:  398 QTC Calculation: 429 R Axis:   6 Text Interpretation:  Normal sinus rhythm Cannot rule out Anterior infarct , age undetermined Abnormal ECG No significant change since last tracing Confirmed by Gareth Morgan [phone]) on 09/29/2017 9:08:01 PM       Radiology Ct Head Wo Contrast  Result Date: 09/29/2017 CLINICAL DATA:  Severe headache and dizziness over the last several days. Visual disturbance. EXAM: CT HEAD WITHOUT CONTRAST TECHNIQUE: Contiguous axial images were obtained from the base of the skull through the vertex without intravenous contrast. COMPARISON:  05/14/2004 FINDINGS: Brain: No evidence of malformation, atrophy, old or acute small or large vessel infarction,  mass lesion, hemorrhage, hydrocephalus or extra-axial collection. No evidence of pituitary lesion. Vascular: No vascular calcification.  No hyperdense vessels. Skull: Normal.  No fracture or focal bone lesion. Sinuses/Orbits: Mild mucosal inflammatory changes of the paranasal sinuses. No layering fluid. Orbits negative. Other: None significant IMPRESSION: No intracranial abnormality.  Normal appearance of the brain. Mucosal inflammatory changes of the paranasal sinuses. Electronically Signed   By: Nelson Chimes M.D.   On: 09/29/2017 21:37    Procedures Procedures (including critical care time)  Medications Ordered in ED Medications  sodium chloride 0.9 % bolus 1,000 mL (0 mLs Intravenous Stopped 09/29/17 2320)  sodium chloride 0.9 % bolus 1,000 mL (0 mLs Intravenous Stopped 09/29/17 2342)     Initial Impression / Assessment and Plan / ED Course  I have reviewed the triage vital signs and the nursing notes.  Pertinent labs & imaging results that were available during my care of the patient were reviewed by me and considered in my medical decision making (see chart for details).     26 year old female with a history of Crohn's and bipolar disorder presenting with fatigue, lightheadedness and a headache gradually worsening over 2 days.  Afebrile and hemodynamically stable.  No signs of meningismus and no rash.  Doubt meningitis/encephalitis.  Patient denies any chest pain, shortness of breath or pleuritic pain and is PERC negative with low Wells, doubt PE.  Had a long "Crohn's flare" associated with significant diarrhea that has resolved. EKG with normal sinus rhythm no acute ischemic changes and troponin negative, doubt ACS.  As her symptoms have been constant for 2 days and she is low risk I do not believe that she would benefit from a 3-hour troponin.  Not pregnant.  UA negative for infection.  CMP and lipase normal.  CBC with mild anemia however patient denies hemoptysis, hematemesis,  hematochezia or hematuria, will have the patient follow-up with her PCP for recheck.  CT head negative.  With a negative CT and a normal neurologic exam, low suspicion for CVA. Likely related to dehydration after several weeks of diarrhea. Given 2L NS and sx improved. Will have pt f/u with PCP. Return precautions given.  Final Clinical Impressions(s) / ED Diagnoses   Final diagnoses:  Dehydration  Other fatigue  Acute nonintractable headache, unspecified headache type    ED Discharge Orders    None  Tayten Heber Mali, MD 09/30/17 7001    Gareth Morgan, MD 10/02/17 [phone]

## 2017-09-29 NOTE — ED Triage Notes (Addendum)
Pt states 5 week flare of Chrons disease with diarrhea (2 episodes of emesis throughout the 5 weeks). Pt states it cleared up 2 days ago but she just feels weak. Pt states she drank a lot of water. Pt states she feels like she is going to pass out a few times. LMP last week. Denies pain anywhere.

## 2017-09-29 NOTE — ED Notes (Signed)
Urine culture grey tube sent down with a save label

## 2017-09-29 NOTE — ED Notes (Signed)
Water given to drink

## 2017-12-03 ENCOUNTER — Emergency Department (HOSPITAL_COMMUNITY)
Admission: EM | Admit: 2017-12-03 | Discharge: 2017-12-03 | Disposition: A | Discharge status: Home / Self Care | Payer: Managed Care, Other (non HMO) | Attending: Emergency Medicine | Admitting: Emergency Medicine

## 2017-12-03 ENCOUNTER — Encounter (HOSPITAL_COMMUNITY): Payer: Self-pay | Admitting: Nurse Practitioner

## 2017-12-03 DIAGNOSIS — R300 Dysuria: Secondary | ICD-10-CM | POA: Diagnosis not present

## 2017-12-03 DIAGNOSIS — Z79899 Other long term (current) drug therapy: Secondary | ICD-10-CM | POA: Insufficient documentation

## 2017-12-03 DIAGNOSIS — N3001 Acute cystitis with hematuria: Secondary | ICD-10-CM | POA: Insufficient documentation

## 2017-12-03 DIAGNOSIS — J45909 Unspecified asthma, uncomplicated: Secondary | ICD-10-CM | POA: Insufficient documentation

## 2017-12-03 DIAGNOSIS — R350 Frequency of micturition: Secondary | ICD-10-CM | POA: Insufficient documentation

## 2017-12-03 DIAGNOSIS — N898 Other specified noninflammatory disorders of vagina: Secondary | ICD-10-CM | POA: Diagnosis present

## 2017-12-03 LAB — URINALYSIS, ROUTINE W REFLEX MICROSCOPIC
BILIRUBIN URINE: NEGATIVE
Glucose, UA: NEGATIVE mg/dL
Ketones, ur: NEGATIVE mg/dL
Nitrite: NEGATIVE
PROTEIN: 100 mg/dL — AB
Specific Gravity, Urine: 1.012 (ref 1.005–1.030)
pH: 5 (ref 5.0–8.0)

## 2017-12-03 LAB — WET PREP, GENITAL
CLUE CELLS WET PREP: NONE SEEN
Sperm: NONE SEEN
TRICH WET PREP: NONE SEEN
YEAST WET PREP: NONE SEEN

## 2017-12-03 LAB — PREGNANCY, URINE: Preg Test, Ur: NEGATIVE

## 2017-12-03 MED ORDER — FLUCONAZOLE 150 MG PO TABS
150.0000 mg | ORAL_TABLET | Freq: Once | ORAL | Status: AC
  Administered 2017-12-03: 150 mg via ORAL
  Filled 2017-12-03: qty 1

## 2017-12-03 MED ORDER — CEPHALEXIN 500 MG PO CAPS
500.0000 mg | ORAL_CAPSULE | Freq: Once | ORAL | Status: AC
  Administered 2017-12-03: 500 mg via ORAL
  Filled 2017-12-03: qty 1

## 2017-12-03 MED ORDER — CEPHALEXIN 500 MG PO CAPS: 500.0000 mg | ORAL_CAPSULE | Freq: Three times a day (TID) | ORAL | 0 refills | Status: DC

## 2017-12-03 MED ORDER — PHENAZOPYRIDINE HCL 200 MG PO TABS: 200.0000 mg | ORAL_TABLET | Freq: Three times a day (TID) | ORAL | 0 refills | Status: DC

## 2017-12-03 MED ORDER — PHENAZOPYRIDINE HCL 100 MG PO TABS
100.0000 mg | ORAL_TABLET | Freq: Once | ORAL | Status: AC
  Administered 2017-12-03: 100 mg via ORAL
  Filled 2017-12-03: qty 1

## 2017-12-03 NOTE — Discharge Instructions (Signed)
Take the prescribed medication as directed. Follow-up with your primary care doctor.  You can see the women's clinic for dedicated GYN exam if needed. Return to the ED for new or worsening symptoms.

## 2017-12-03 NOTE — ED Provider Notes (Signed)
[facility] DEPT Provider Note   CSN: [id number] Arrival date & time: 12/03/17  0059     History   Chief Complaint Chief Complaint  Patient presents with  . Vaginal Discharge  . Urinary Frequency    HPI Sydney Deleon is a 27 y.o. female.  The history is provided by the patient and medical records.     27 year old female with history of asthma, bipolar disorder, depression, chronic bronchitis, GERD, presenting to the ED for vaginal discharge, urinary frequency, dysuria.  Reports this began about 4 days ago and has remained persistent.  States she has a lot of pain and irritation in her vaginal region, even worse after she urinates.  Does report some vaginal discharge that is yellow/white in color.  She denies any new sexual partners or concern for STD at this time.  Denies any pelvic or abdominal pain.  No fever or chills.  No flank pain.  Past Medical History:  Diagnosis Date  . Acid reflux   . Asthma   . Bipolar 1 disorder ([place])   . Chronic bronchitis ([place])   . Depression     Patient Active Problem List   Diagnosis Date Noted  . Depression   . Bipolar 1 disorder ([place])   . Asthma   . Acid reflux     Past Surgical History:  Procedure Laterality Date  . TONSILECTOMY/ADENOIDECTOMY WITH MYRINGOTOMY      OB History    No data available       Home Medications    Prior to Admission medications   Medication Sig Start Date End Date Taking? Authorizing Provider  citalopram (CELEXA) 40 MG tablet Take 40 mg by mouth daily.    [provider]  dicyclomine (BENTYL) 10 MG capsule Take 1 capsule (10 mg total) by mouth 4 (four) times daily. Patient not taking: Reported on 09/29/2017 12/18/16 09/29/17  Laban Emperor, PA-C  loperamide (IMODIUM A-D) 2 MG tablet Take 1 tablet (2 mg total) by mouth 4 (four) times daily as needed for diarrhea or loose stools. 08/04/16   Eula Listen, MD  Magnesium Hydroxide (PHILLIPS MILK OF  MAGNESIA PO) Take 1 tablet by mouth daily.    [provider]  ondansetron (ZOFRAN ODT) 4 MG disintegrating tablet Take 1 tablet (4 mg total) by mouth every 8 (eight) hours as needed for nausea or vomiting. Patient not taking: Reported on 09/29/2017 08/04/16   Eula Listen, MD  ondansetron [facility]) 4 MG tablet Take 1-2 tabs by mouth every 8 hours as needed for nausea/vomiting Patient not taking: Reported on 09/29/2017 06/04/16   Hinda Kehr, MD  ondansetron (ZOFRAN) 4 MG tablet Take 1 tablet (4 mg total) by mouth every 8 (eight) hours as needed for nausea or vomiting. Patient not taking: Reported on 09/29/2017 12/02/16   Nance Pear, MD  predniSONE (DELTASONE) 20 MG tablet Take 2 tablets (40 mg total) by mouth daily. Patient not taking: Reported on 09/29/2017 03/23/16   Nance Pear, MD  promethazine (PHENERGAN) 25 MG tablet Take 1 tablet (25 mg total) by mouth every 6 (six) hours as needed for nausea or vomiting. Patient not taking: Reported on 09/29/2017 12/20/16   Earleen Newport, MD  ranitidine (ZANTAC) 150 MG tablet Take 150 mg by mouth 2 (two) times daily.    [provider]  traMADol (ULTRAM) 50 MG tablet Take 1 tablet (50 mg total) by mouth every 6 (six) hours as needed. Patient not taking: Reported on 09/29/2017 12/20/16 12/20/17  Earleen Newport, MD    Family History History reviewed. No pertinent family history.  Social History Social History   Tobacco Use  . Smoking status: Never Smoker  . Smokeless tobacco: Never Used  Substance Use Topics  . Alcohol use: No  . Drug use: No     Allergies   Patient has no known allergies.   Review of Systems Review of Systems  Genitourinary: Positive for dysuria, frequency, vaginal discharge and vaginal pain.  All other systems reviewed and are negative.    Physical Exam Updated Vital Signs BP (!) 146/86 (BP Location: Left Arm)   Pulse 90   Temp 98.8 F (37.1 C) (Oral)   Resp 18   SpO2  100%   Physical Exam  Constitutional: She is oriented to person, place, and time. She appears well-developed and well-nourished.  HENT:  Head: Normocephalic and atraumatic.  Mouth/Throat: Oropharynx is clear and moist.  Eyes: Conjunctivae and EOM are normal. Pupils are equal, round, and reactive to light.  Neck: Normal range of motion.  Cardiovascular: Normal rate, regular rhythm and normal heart sounds.  Pulmonary/Chest: Effort normal and breath sounds normal. No stridor. No respiratory distress.  Abdominal: Soft. Bowel sounds are normal. There is no tenderness. There is no rebound.  Genitourinary:  Genitourinary Comments: Exam chaperoned by RN Normal female external genitalia without visible lesions/rash; some curd-like vaginal discharge surrounding introitus and in vaginal canal; cervical os closed, no bleeding or friability; adnexal or CMT  Musculoskeletal: Normal range of motion.  Neurological: She is alert and oriented to person, place, and time.  Skin: Skin is warm and dry.  Psychiatric: She has a normal mood and affect.  Nursing note and vitals reviewed.    ED Treatments / Results  Labs (all labs ordered are listed, but only abnormal results are displayed) Labs Reviewed  WET PREP, GENITAL - Abnormal; Notable for the following components:      Result Value   WBC, Wet Prep HPF POC FEW (*)    All other components within normal limits  URINALYSIS, ROUTINE W REFLEX MICROSCOPIC - Abnormal; Notable for the following components:   APPearance TURBID (*)    Hgb urine dipstick MODERATE (*)    Protein, ur 100 (*)    Leukocytes, UA LARGE (*)    Bacteria, UA RARE (*)    Squamous Epithelial / LPF 0-5 (*)    All other components within normal limits  PREGNANCY, URINE  POC URINE PREG, ED  GC/CHLAMYDIA PROBE AMP ([place]) NOT AT [facility]    EKG  EKG Interpretation None       Radiology No results found.  Procedures Procedures (including critical care time)  Medications  Ordered in ED Medications - No data to display   Initial Impression / Assessment and Plan / ED Course  I have reviewed the triage vital signs and the nursing notes.  Pertinent labs & imaging results that were available during my care of the patient were reviewed by me and considered in my medical decision making (see chart for details).  27 year old female here with urinary symptoms of vaginal discharge.  Reports some vaginal irritation as well, worse with urinating it.  She is afebrile and nontoxic.  Abdomen is soft and benign.  Pelvic exam performed, chaperoned by RN.  No apparent vaginal lesions or sores.  No rashes.  Does have some curd-like vaginal discharge surrounding the introitus and in the vaginal vault.  No cervical motion tenderness, adnexal tenderness.  No abnormal bleeding.  UA with too numerous to count white blood cells, large leukocytes with white blood cell clumps.  Wet prep with WBCs.  No yeast noted on swab but exam findings consistent for such.  Will treat for yeast infection as well as UTI.  Discussed plan with patient, she acknowledged understanding and agreed with plan of care.  Return precautions given for new or worsening symptoms.  Final Clinical Impressions(s) / ED Diagnoses   Final diagnoses:  Acute cystitis with hematuria  Vaginal irritation    ED Discharge Orders        Ordered    cephALEXin (KEFLEX) 500 MG capsule  3 times daily     12/03/17 0602    phenazopyridine (PYRIDIUM) 200 MG tablet  3 times daily     12/03/17 0602       Larene Pickett, PA-C 12/03/17 0617    Ward, Delice Bison, DO 12/03/17 [phone]

## 2017-12-03 NOTE — ED Triage Notes (Signed)
Pt is c/o yellow vaginal discharge, urinary frequency and dysuria. Reports onset of symptoms as 4 days ago.

## 2017-12-05 LAB — GC/CHLAMYDIA PROBE AMP (~~LOC~~) NOT AT ARMC
CHLAMYDIA, DNA PROBE: NEGATIVE
Neisseria Gonorrhea: POSITIVE — AB

## 2017-12-16 ENCOUNTER — Other Ambulatory Visit: Payer: Self-pay

## 2017-12-16 ENCOUNTER — Emergency Department (HOSPITAL_COMMUNITY)
Admission: EM | Admit: 2017-12-16 | Discharge: 2017-12-16 | Disposition: A | Discharge status: Home / Self Care | Payer: Managed Care, Other (non HMO) | Attending: Emergency Medicine | Admitting: Emergency Medicine

## 2017-12-16 ENCOUNTER — Encounter (HOSPITAL_COMMUNITY): Payer: Self-pay

## 2017-12-16 DIAGNOSIS — O98211 Gonorrhea complicating pregnancy, first trimester: Secondary | ICD-10-CM | POA: Insufficient documentation

## 2017-12-16 DIAGNOSIS — A549 Gonococcal infection, unspecified: Secondary | ICD-10-CM

## 2017-12-16 DIAGNOSIS — Z3A01 Less than 8 weeks gestation of pregnancy: Secondary | ICD-10-CM | POA: Diagnosis not present

## 2017-12-16 DIAGNOSIS — N390 Urinary tract infection, site not specified: Secondary | ICD-10-CM

## 2017-12-16 DIAGNOSIS — Z79899 Other long term (current) drug therapy: Secondary | ICD-10-CM | POA: Insufficient documentation

## 2017-12-16 DIAGNOSIS — R319 Hematuria, unspecified: Secondary | ICD-10-CM

## 2017-12-16 DIAGNOSIS — O2341 Unspecified infection of urinary tract in pregnancy, first trimester: Secondary | ICD-10-CM | POA: Insufficient documentation

## 2017-12-16 DIAGNOSIS — O9952 Diseases of the respiratory system complicating childbirth: Secondary | ICD-10-CM | POA: Diagnosis not present

## 2017-12-16 LAB — RAPID HIV SCREEN (HIV 1/2 AB+AG)
HIV 1/2 ANTIBODIES: NONREACTIVE
HIV-1 P24 Antigen - HIV24: NONREACTIVE

## 2017-12-16 LAB — URINALYSIS, ROUTINE W REFLEX MICROSCOPIC
BACTERIA UA: NONE SEEN
Bilirubin Urine: NEGATIVE
GLUCOSE, UA: NEGATIVE mg/dL
KETONES UR: NEGATIVE mg/dL
Nitrite: NEGATIVE
PROTEIN: NEGATIVE mg/dL
Specific Gravity, Urine: 1.013 (ref 1.005–1.030)
pH: 6 (ref 5.0–8.0)

## 2017-12-16 LAB — POC URINE PREG, ED: PREG TEST UR: POSITIVE — AB

## 2017-12-16 MED ORDER — AZITHROMYCIN 1 G PO PACK
1.0000 g | PACK | Freq: Once | ORAL | Status: AC
  Administered 2017-12-16: 1 g via ORAL
  Filled 2017-12-16: qty 1

## 2017-12-16 MED ORDER — CEPHALEXIN 500 MG PO CAPS: 500.0000 mg | ORAL_CAPSULE | Freq: Two times a day (BID) | ORAL | 0 refills | Status: AC

## 2017-12-16 MED ORDER — CEFTRIAXONE SODIUM 250 MG IJ SOLR
250.0000 mg | Freq: Once | INTRAMUSCULAR | Status: AC
  Administered 2017-12-16: 250 mg via INTRAMUSCULAR
  Filled 2017-12-16: qty 250

## 2017-12-16 MED ORDER — STERILE WATER FOR INJECTION IJ SOLN
INTRAMUSCULAR | Status: AC
  Administered 2017-12-16: 10 mL
  Filled 2017-12-16: qty 10

## 2017-12-16 NOTE — ED Triage Notes (Signed)
Pt was seen her on Feb 16 th for a UTI. Pt was also checked for STD. Pt was told on Feb 22 that she was positive for gonorrhea. Pt was told by the health department to come to ED for treatment. Lab results reflect this complaint.

## 2017-12-16 NOTE — Discharge Instructions (Signed)
Today you were found to be pregnant on your pregnancy test.  You will need to go to the store and buy prenatal vitamins and start taking them daily.  You will need to follow-up with your OB/GYN doctor or you may follow-up with [facility] if you do not have one.  You were given information to follow-up with [facility] for your further prenatal care.  You were also diagnosed with a urinary tract infection today and you will be treated with antibiotics to resolve this infection.  The antibiotic is called Keflex.  It is safe to take in pregnancy.  You were also treated for gonorrhea today with antibiotics.  The results of your HIV test were negative.  Your syphilis test is still pending, and the hospital will contact you if it is positive.  Your urine was also sent for urine culture and the hospital will contact you if your antibiotic needs to be changed based on the results of this.  Your partner will need to be treated for gonorrhea as well.  Please return to the emergency department for any new or worsening symptoms.

## 2017-12-16 NOTE — ED Provider Notes (Signed)
[facility] DEPT Provider Note   CSN: [id number] Arrival date & time: 12/16/17  1416     History   Chief Complaint Chief Complaint  Patient presents with  . Needs Medication    HPI Sydney Deleon is a 27 y.o. female.  HPI   Patient is a 27 year old female who presents the ED today requesting to be treated for gonorrhea.  Patient states she was seen on 2/16 and diagnosed with urinary tract infection and was tested for gonorrhea/chalmydia at that time.  She was given Keflex at that time as well as Pyridium, however she was not able to fill these prescriptions.  States she is no longer having dysuria, frequency, or any urinary symptoms.  She states that she was contacted by the health department and informed that she was diagnosed with gonorrhea at her last visit, and she states that is why she is here today so that she may have treatment for this.  She denies any symptoms including no pelvic pain, no vaginal discharge, vaginal bleeding, or any vaginal symptoms.  She denies abdominal pain, flank pain, nausea, vomiting, diarrhea, fevers, or any other symptoms. LMP 1/28.  Past Medical History:  Diagnosis Date  . Acid reflux   . Asthma   . Bipolar 1 disorder ([place])   . Chronic bronchitis ([place])   . Depression     Patient Active Problem List   Diagnosis Date Noted  . Depression   . Bipolar 1 disorder ([place])   . Asthma   . Acid reflux     Past Surgical History:  Procedure Laterality Date  . TONSILECTOMY/ADENOIDECTOMY WITH MYRINGOTOMY      OB History    No data available       Home Medications    Prior to Admission medications   Medication Sig Start Date End Date Taking? Authorizing Provider  cephALEXin (KEFLEX) 500 MG capsule Take 1 capsule (500 mg total) by mouth 2 (two) times daily for 7 days. 12/16/17 12/23/17  Hasini Peachey S, PA-C  citalopram (CELEXA) 40 MG tablet Take 40 mg by mouth daily.    [provider]  loperamide  (IMODIUM A-D) 2 MG tablet Take 1 tablet (2 mg total) by mouth 4 (four) times daily as needed for diarrhea or loose stools. 08/04/16   Eula Listen, MD  ondansetron (ZOFRAN ODT) 4 MG disintegrating tablet Take 1 tablet (4 mg total) by mouth every 8 (eight) hours as needed for nausea or vomiting. Patient not taking: Reported on 09/29/2017 08/04/16   Eula Listen, MD  ondansetron [facility]) 4 MG tablet Take 1-2 tabs by mouth every 8 hours as needed for nausea/vomiting Patient not taking: Reported on 09/29/2017 06/04/16   Hinda Kehr, MD  ondansetron (ZOFRAN) 4 MG tablet Take 1 tablet (4 mg total) by mouth every 8 (eight) hours as needed for nausea or vomiting. Patient not taking: Reported on 09/29/2017 12/02/16   Nance Pear, MD  phenazopyridine (PYRIDIUM) 200 MG tablet Take 1 tablet (200 mg total) by mouth 3 (three) times daily. 12/03/17   Larene Pickett, PA-C  promethazine (PHENERGAN) 25 MG tablet Take 1 tablet (25 mg total) by mouth every 6 (six) hours as needed for nausea or vomiting. Patient not taking: Reported on 09/29/2017 12/20/16   Earleen Newport, MD  ranitidine (ZANTAC) 150 MG tablet Take 150 mg by mouth 2 (two) times daily.    [provider]  traMADol (ULTRAM) 50 MG tablet Take 1 tablet (50 mg total) by mouth  every 6 (six) hours as needed. Patient not taking: Reported on 09/29/2017 12/20/16 12/20/17  Earleen Newport, MD    Family History History reviewed. No pertinent family history.  Social History Social History   Tobacco Use  . Smoking status: Never Smoker  . Smokeless tobacco: Never Used  Substance Use Topics  . Alcohol use: No  . Drug use: No     Allergies   Patient has no known allergies.   Review of Systems Review of Systems  Constitutional: Negative for fever.  HENT: Negative for congestion.   Eyes: Negative for visual disturbance.  Respiratory: Negative for shortness of breath.   Cardiovascular: Negative for chest pain.    Gastrointestinal: Negative for abdominal pain, constipation, diarrhea, nausea and vomiting.  Genitourinary: Negative for dysuria, flank pain, frequency, pelvic pain, urgency, vaginal bleeding and vaginal discharge.  Musculoskeletal: Negative for back pain.  Skin: Negative for rash.  Neurological: Negative for headaches.     Physical Exam Updated Vital Signs BP 129/88 (BP Location: Right Arm)   Pulse 72   Temp 98.4 F (36.9 C) (Oral)   Resp 20   Ht 5' 5"  (1.651 m)   Wt 105.7 kg (233 lb)   SpO2 100%   BMI 38.77 kg/m   Physical Exam  Constitutional: She appears well-developed and well-nourished. No distress.  HENT:  Head: Normocephalic and atraumatic.  Eyes: Conjunctivae are normal.  Neck: Neck supple.  Cardiovascular: Normal rate, regular rhythm and normal heart sounds.  No murmur heard. Pulmonary/Chest: Effort normal and breath sounds normal. No respiratory distress. She has no wheezes.  Abdominal: Soft. Bowel sounds are normal. She exhibits no distension. There is no tenderness. There is no guarding.  No suprapubic TTP  Musculoskeletal: She exhibits no edema.  Neurological: She is alert.  Skin: Skin is warm and dry.  Psychiatric: She has a normal mood and affect.  Nursing note and vitals reviewed.    ED Treatments / Results  Labs (all labs ordered are listed, but only abnormal results are displayed) Labs Reviewed  URINALYSIS, ROUTINE W REFLEX MICROSCOPIC - Abnormal; Notable for the following components:      Result Value   Hgb urine dipstick MODERATE (*)    Leukocytes, UA MODERATE (*)    Squamous Epithelial / LPF 0-5 (*)    All other components within normal limits  POC URINE PREG, ED - Abnormal; Notable for the following components:   Preg Test, Ur POSITIVE (*)    All other components within normal limits  URINE CULTURE  RAPID HIV SCREEN (HIV 1/2 AB+AG)  RPR    EKG  EKG Interpretation None       Radiology No results found.  Procedures Procedures  (including critical care time)  Medications Ordered in ED Medications  cefTRIAXone (ROCEPHIN) injection 250 mg (250 mg Intramuscular Given 12/16/17 1938)  azithromycin (ZITHROMAX) powder 1 g (1 g Oral Given 12/16/17 1937)  sterile water (preservative free) injection (10 mLs  Given 12/16/17 1938)     Initial Impression / Assessment and Plan / ED Course  I have reviewed the triage vital signs and the nursing notes.  Pertinent labs & imaging results that were available during my care of the patient were reviewed by me and considered in my medical decision making (see chart for details).     Final Clinical Impressions(s) / ED Diagnoses   Final diagnoses:  Urinary tract infection with hematuria, site unspecified  Less than [redacted] weeks gestation of pregnancy  Gonorrhea   27 year old  female presented to the ED requesting treatment for gonorrhea after she had a positive GC chlamydia test on 2/16.  Patient was also diagnosed with UTI at that time and was given Keflex, however was unable to fill this prescription.  Pt states that UTI symptoms have resolved, however UA was still obtained to check if patient is still has infection. Urine pregnancy test was also obtained and was positive for pregnancy.  UA with +leuks, too numerous to count WBC, hematuria, and squamous cells. Findings could be secondary to gonorrhea, however will tx for UTI as well given pt is pregnant.  Will send urine for culture. Patient was also treated with azithromycin and ceftriaxone for gonorrhea.  HIV sent and was nonreactive.  Syphilis pending, and patient is aware that hospital will contact her if it is positive.  Patient also aware that she will be contacted if urine culture comes back and she requires a different antibiotic.  Advised patient to buy over-the-counter prenatal vitamins and to start taking them daily.  Advised her to follow-up with [facility] to make an appointment for further medical treatment during her pregnancy,  and gave her information to do so.  Advised safe sex practices and that her partner also needs to be treated  As well. Discussed reasons to return to the emergency department sooner.  Patient understands the plan and agrees to follow-up as discussed.  All questions were answered.  ED Discharge Orders        Ordered    cephALEXin (KEFLEX) 500 MG capsule  2 times daily     12/16/17 1856       Rodney Booze, PA-C 12/16/17 2254    Duffy Bruce, MD 12/17/17 [phone]

## 2017-12-17 LAB — RPR: RPR Ser Ql: NONREACTIVE

## 2017-12-18 ENCOUNTER — Other Ambulatory Visit: Payer: Self-pay

## 2017-12-18 ENCOUNTER — Encounter (HOSPITAL_COMMUNITY): Payer: Self-pay | Admitting: *Deleted

## 2017-12-18 ENCOUNTER — Inpatient Hospital Stay (HOSPITAL_COMMUNITY)
Admission: AD | Admit: 2017-12-18 | Discharge: 2017-12-18 | Disposition: A | Discharge status: Home / Self Care | Payer: Managed Care, Other (non HMO) | Source: Ambulatory Visit | Attending: Obstetrics & Gynecology | Admitting: Obstetrics & Gynecology

## 2017-12-18 DIAGNOSIS — Z3A01 Less than 8 weeks gestation of pregnancy: Secondary | ICD-10-CM | POA: Diagnosis not present

## 2017-12-18 DIAGNOSIS — O039 Complete or unspecified spontaneous abortion without complication: Secondary | ICD-10-CM | POA: Insufficient documentation

## 2017-12-18 DIAGNOSIS — O209 Hemorrhage in early pregnancy, unspecified: Secondary | ICD-10-CM

## 2017-12-18 LAB — WET PREP, GENITAL
Clue Cells Wet Prep HPF POC: NONE SEEN
SPERM: NONE SEEN
TRICH WET PREP: NONE SEEN
YEAST WET PREP: NONE SEEN

## 2017-12-18 LAB — URINE CULTURE: CULTURE: NO GROWTH

## 2017-12-18 LAB — CBC
HCT: 36.1 % (ref 36.0–46.0)
Hemoglobin: 12 g/dL (ref 12.0–15.0)
MCH: 29.1 pg (ref 26.0–34.0)
MCHC: 33.2 g/dL (ref 30.0–36.0)
MCV: 87.4 fL (ref 78.0–100.0)
PLATELETS: 424 10*3/uL — AB (ref 150–400)
RBC: 4.13 MIL/uL (ref 3.87–5.11)
RDW: 13.1 % (ref 11.5–15.5)
WBC: 5.7 10*3/uL (ref 4.0–10.5)

## 2017-12-18 LAB — HCG, QUANTITATIVE, PREGNANCY: hCG, Beta Chain, Quant, S: 19 m[IU]/mL — ABNORMAL HIGH (ref <5)

## 2017-12-18 MED ORDER — IBUPROFEN 600 MG PO TABS: 600.0000 mg | ORAL_TABLET | Freq: Four times a day (QID) | ORAL | 1 refills | Status: DC | PRN

## 2017-12-18 NOTE — MAU Note (Signed)
Pt. Arrived via EMS, with vaginal bleeding starting after lunch time. Abd. "period cramps" 5/10.  Pt. Denies passing clots.

## 2017-12-18 NOTE — MAU Provider Note (Signed)
Chief Complaint: Vaginal Bleeding and Abdominal Pain   None     SUBJECTIVE HPI: Sydney Deleon is a 27 y.o. G2P0 at [id number] LMP who [facility] matern[facility] admissions [facility] with onset of cramping abdominal pain and bright red vaginal bleeding with clots today. She reports passing tissue in the MAU bathroom after her arrival.  The pain is worsening, and feels like severe menstrual cramps. The bleeding is heavy, soaking a pad every hour or two with clots. There are no other associated symptoms. She has not tried any treatments.   HPI  Past Medical History:  Diagnosis Date  . Acid reflux   . Asthma   . [place] 1 [place]   . [place]itis [place]   . Depression    Past Surgical History:  Procedure Laterality Date  . TONSILECTOMY/ADENOIDECTOMY WITH MYRINGOTOMY     Social History   Socioeconomic History  . Marital status: Married    Spouse name: Not on file  . Number of children: Not on file  . Years of education: Not on file  . Highest education level: Not on file  Social Needs  . Financial resource strain: Not on file  . Food insecurity - worry: Not on file  . Food insecurity - inability: Not on file  . Transportation needs - medical: Not on file  . Transportation needs - non-medical: Not on file  Occupational History  . Not on file  Tobacco Use  . Smoking status: Never Smoker  . Smokeless tobacco: Never Used  Substance and Sexual Activity  . Alcohol use: No  . Drug use: No  . Sexual activity: Yes    Birth control/protection: None  Other Topics Concern  . Not on file  Social History Narrative  . Not on file   No current facility-administered medications on file prior to encounter.    Current Outpatient Medications on File Prior to Encounter  Medication Sig Dispense Refill  . albuterol (PROVENTIL HFA;VENTOLIN HFA) 108 (90 Base) MCG/ACT inhaler Inhale 2 puffs into the lungs every 4 (four) hours as  needed for wheezing or shortness of breath.    . cephALEXin (KEFLEX) 500 MG capsule Take 1 capsule (500 mg total) by mouth 2 (two) times daily for 7 days. 14 capsule 0  . citalopram (CELEXA) 40 MG tablet Take 40 mg by mouth daily.    . Multiple Vitamin (MULTIVITAMIN WITH MINERALS) TABS tablet Take 1 tablet by mouth daily.    . ranitidine (ZANTAC) 150 MG tablet Take 150 mg by mouth 2 (two) times daily.    . phenazopyridine (PYRIDIUM) 200 MG tablet Take 1 tablet (200 mg total) by mouth 3 (three) times daily. (Patient not taking: Reported on 12/18/2017) 9 tablet 0   No Known Allergies  ROS:  Review of Systems  Constitutional: Negative for chills, fatigue and fever.  Respiratory: Negative for shortness of breath.   Cardiovascular: Negative for chest pain.  Gastrointestinal: Positive for abdominal pain. Negative for nausea and vomiting.  Genitourinary: Positive for pelvic pain and vaginal bleeding. Negative for difficulty urinating, dysuria, flank pain, vaginal discharge and vaginal pain.  Neurological: Negative for dizziness and headaches.  Psychiatric/Behavioral: Negative.      I have reviewed patient's Past Medical Hx, Surgical Hx, Family Hx, Social Hx, medications and allergies.   Physical Exam   Patient Vitals for the past 24 hrs:  BP Temp Temp src Pulse Resp Height  12/18/17 1424 107/78 99.4 F (37.4  C) Oral 85 18 5' 5"  (1.651 m)   Constitutional: Well-developed, well-nourished female in no acute distress.  Cardiovascular: normal rate Respiratory: normal effort GI: Abd soft, non-tender. Pos BS x 4 MS: Extremities nontender, no edema, normal ROM Neurologic: Alert and oriented x 4.  GU: Neg CVAT.  PELVIC EXAM: Cervix pink, visually closed, without lesion, moderate amount dark red bleeding with small clots, vaginal walls and external genitalia normal Bimanual exam: Cervix 0/long/high, firm, anterior, neg CMT, uterus nontender, nonenlarged, adnexa without tenderness, enlargement, or  mass   LAB RESULTS Results for orders placed or performed during the hospital encounter of 12/18/17 (from the past 24 hour(s))  CBC     Status: Abnormal   Collection Time: 12/18/17  2:46 PM  Result Value Ref Range   WBC 5.7 4.0 - 10.5 K/uL   RBC 4.13 3.87 - 5.11 MIL/uL   Hemoglobin 12.0 12.0 - 15.0 g/dL   HCT 36.1 36.0 - 46.0 %   MCV 87.4 78.0 - 100.0 fL   MCH 29.1 26.0 - 34.0 pg   MCHC 33.2 30.0 - 36.0 g/dL   RDW 13.1 11.5 - 15.5 %   Platelets 424 (H) 150 - 400 K/uL  hCG, quantitative, pregnancy     Status: Abnormal   Collection Time: 12/18/17  2:46 PM  Result Value Ref Range   hCG, Beta Chain, Quant, S 19 (H) <5 mIU/mL  Wet prep, genital     Status: Abnormal   Collection Time: 12/18/17  3:45 PM  Result Value Ref Range   Yeast Wet Prep HPF POC NONE SEEN NONE SEEN   Trich, Wet Prep NONE SEEN NONE SEEN   Clue Cells Wet Prep HPF POC NONE SEEN NONE SEEN   WBC, Wet Prep HPF POC FEW (A) NONE SEEN   Sperm NONE SEEN        IMAGING No results found.  MAU Management/MDM: Ordered labs and reviewed results.  With quant hcg of 19 and known positive UPT on 12/16/17, this is likely very early miscarriage. Consult Dr Ihor Dow with assessment and findings.  No US performed today, will follow  hcg in 48 hours  Rx for ibuprofen 600 mg Q 6 hours PRN pain.  Pt discharged with strict bleeding/pain precautions.  ASSESSMENT 1. SAB (spontaneous abortion)   2. Vaginal bleeding in pregnancy, first trimester     PLAN Discharge home Allergies as of 12/18/2017   No Known Allergies     Medication List    TAKE these medications   albuterol 108 (90 Base) MCG/ACT inhaler Commonly known as:  PROVENTIL HFA;VENTOLIN HFA Inhale 2 puffs into the lungs every 4 (four) hours as needed for wheezing or shortness of breath.   cephALEXin 500 MG capsule Commonly known as:  KEFLEX Take 1 capsule (500 mg total) by mouth 2 (two) times daily for 7 days.   citalopram 40 MG tablet Commonly known as:   CELEXA Take 40 mg by mouth daily.   ibuprofen 600 MG tablet Commonly known as:  ADVIL,MOTRIN Take 1 tablet (600 mg total) by mouth every 6 (six) hours as needed.   multivitamin with minerals Tabs tablet Take 1 tablet by mouth daily.   phenazopyridine 200 MG tablet Commonly known as:  PYRIDIUM Take 1 tablet (200 mg total) by mouth 3 (three) times daily.   ZANTAC 150 MG tablet Generic drug:  ranitidine Take 150 mg by mouth 2 (two) times daily.      [facility] Follow  up.   Specialty:  Obstetrics and Gynecology Why:  Come to the [facility] office at 1:30 pm on Tuesday, 12/20/17 for labwork. Return to MAU as needed for emergencies. Contact information: [place] Certified Nurse-Midwife 12/18/2017  4:26 PM

## 2017-12-19 LAB — GC/CHLAMYDIA PROBE AMP (~~LOC~~) NOT AT ARMC
Chlamydia: NEGATIVE
Neisseria Gonorrhea: NEGATIVE

## 2017-12-19 LAB — HIV ANTIBODY (ROUTINE TESTING W REFLEX): HIV Screen 4th Generation wRfx: NONREACTIVE

## 2017-12-20 ENCOUNTER — Ambulatory Visit: Payer: Managed Care, Other (non HMO)

## 2017-12-20 ENCOUNTER — Telehealth: Payer: Self-pay | Admitting: General Practice

## 2017-12-20 NOTE — Telephone Encounter (Signed)
Patient no showed for stat bhcg appt today. Called patient and she states she had a lot going on and couldn't get over here today. Patient states she will come in tomorrow at 11am. Patient had no questions

## 2017-12-21 ENCOUNTER — Ambulatory Visit: Payer: Managed Care, Other (non HMO) | Admitting: *Deleted

## 2017-12-21 DIAGNOSIS — O3680X0 Pregnancy with inconclusive fetal viability, not applicable or unspecified: Secondary | ICD-10-CM

## 2017-12-21 LAB — HCG, QUANTITATIVE, PREGNANCY: hCG, Beta Chain, Quant, S: 4 m[IU]/mL (ref <5)

## 2017-12-21 NOTE — Progress Notes (Signed)
Here for stat bhcg. Denies pain, states she is still having period like bleeding . She states she understands she may be having a miscarriage , but she is sad because this is her first pregnancy and she wanted it. I informed her we do not yet know if this is a  Miscarriage and because of her pain she was having we were concerned it is a ectopic pregnancy. I explained we will draw the blood and sent it stat and have her wait for results in the lobby. Then when results come back will review with provider and then her. She voices understanding. Support given.   Results reviewed with Kerry Hough, PA . Informed patient labs do confirm she has had a miscarriage and that she may bleed a little longer. Recommend follow up in 2 weeks with provider. Support given.  Instructed no unprotected  intercourse until follow up.

## 2017-12-21 NOTE — Progress Notes (Signed)
I have reviewed the chart and agree with nursing staff's documentation of this patient's encounter.  Kerry Hough, PA-C 12/21/2017 1:26 PM

## 2018-04-09 ENCOUNTER — Emergency Department (HOSPITAL_COMMUNITY)
Admission: EM | Admit: 2018-04-09 | Discharge: 2018-04-09 | Disposition: A | Discharge status: Home / Self Care | Payer: Medicaid Other | Attending: Emergency Medicine | Admitting: Emergency Medicine

## 2018-04-09 ENCOUNTER — Encounter (HOSPITAL_COMMUNITY): Payer: Self-pay

## 2018-04-09 ENCOUNTER — Emergency Department (HOSPITAL_COMMUNITY): Payer: Medicaid Other

## 2018-04-09 DIAGNOSIS — R112 Nausea with vomiting, unspecified: Secondary | ICD-10-CM | POA: Insufficient documentation

## 2018-04-09 DIAGNOSIS — R197 Diarrhea, unspecified: Secondary | ICD-10-CM

## 2018-04-09 DIAGNOSIS — Z79899 Other long term (current) drug therapy: Secondary | ICD-10-CM | POA: Insufficient documentation

## 2018-04-09 DIAGNOSIS — R1084 Generalized abdominal pain: Secondary | ICD-10-CM | POA: Insufficient documentation

## 2018-04-09 DIAGNOSIS — J45909 Unspecified asthma, uncomplicated: Secondary | ICD-10-CM | POA: Insufficient documentation

## 2018-04-09 LAB — COMPREHENSIVE METABOLIC PANEL
ALT: 11 U/L — ABNORMAL LOW (ref 14–54)
ANION GAP: 10 (ref 5–15)
AST: 13 U/L — ABNORMAL LOW (ref 15–41)
Albumin: 3.4 g/dL — ABNORMAL LOW (ref 3.5–5.0)
Alkaline Phosphatase: 45 U/L (ref 38–126)
BUN: 8 mg/dL (ref 6–20)
CHLORIDE: 104 mmol/L (ref 101–111)
CO2: 23 mmol/L (ref 22–32)
Calcium: 9.1 mg/dL (ref 8.9–10.3)
Creatinine, Ser: 0.77 mg/dL (ref 0.44–1.00)
GFR calc non Af Amer: >60 mL/min (ref >60)
GLUCOSE: 108 mg/dL — AB (ref 65–99)
Potassium: 3.7 mmol/L (ref 3.5–5.1)
SODIUM: 137 mmol/L (ref 135–145)
Total Bilirubin: 0.4 mg/dL (ref 0.3–1.2)
Total Protein: 7.6 g/dL (ref 6.5–8.1)

## 2018-04-09 LAB — URINALYSIS, ROUTINE W REFLEX MICROSCOPIC
BACTERIA UA: NONE SEEN
Bilirubin Urine: NEGATIVE
Glucose, UA: NEGATIVE mg/dL
Ketones, ur: 20 mg/dL — AB
Leukocytes, UA: NEGATIVE
Nitrite: NEGATIVE
PROTEIN: NEGATIVE mg/dL
Specific Gravity, Urine: 1.023 (ref 1.005–1.030)
pH: 5 (ref 5.0–8.0)

## 2018-04-09 LAB — I-STAT BETA HCG BLOOD, ED (MC, WL, AP ONLY)

## 2018-04-09 LAB — CBC
HEMATOCRIT: 34.4 % — AB (ref 36.0–46.0)
HEMOGLOBIN: 10.7 g/dL — AB (ref 12.0–15.0)
MCH: 29 pg (ref 26.0–34.0)
MCHC: 31.1 g/dL (ref 30.0–36.0)
MCV: 93.2 fL (ref 78.0–100.0)
Platelets: 394 10*3/uL (ref 150–400)
RBC: 3.69 MIL/uL — ABNORMAL LOW (ref 3.87–5.11)
RDW: 14.3 % (ref 11.5–15.5)
WBC: 7.8 10*3/uL (ref 4.0–10.5)

## 2018-04-09 LAB — LIPASE, BLOOD: LIPASE: 31 U/L (ref 11–51)

## 2018-04-09 MED ORDER — KETOROLAC TROMETHAMINE 15 MG/ML IJ SOLN: 15.0000 mg | Freq: Once | INTRAMUSCULAR | Status: DC

## 2018-04-09 MED ORDER — ONDANSETRON 4 MG PO TBDP
4.0000 mg | ORAL_TABLET | Freq: Once | ORAL | Status: AC
  Administered 2018-04-09: 4 mg via ORAL
  Filled 2018-04-09: qty 1

## 2018-04-09 MED ORDER — ONDANSETRON 4 MG PO TBDP: 4.0000 mg | ORAL_TABLET | Freq: Three times a day (TID) | ORAL | 0 refills | Status: DC | PRN

## 2018-04-09 MED ORDER — NAPROXEN 500 MG PO TABS: 500.0000 mg | ORAL_TABLET | Freq: Two times a day (BID) | ORAL | 0 refills | Status: DC

## 2018-04-09 MED ORDER — ONDANSETRON HCL 4 MG/2ML IJ SOLN: 4.0000 mg | Freq: Once | INTRAMUSCULAR | Status: DC

## 2018-04-09 MED ORDER — NAPROXEN 250 MG PO TABS
500.0000 mg | ORAL_TABLET | Freq: Once | ORAL | Status: AC
  Administered 2018-04-09: 500 mg via ORAL
  Filled 2018-04-09: qty 2

## 2018-04-09 MED ORDER — SODIUM CHLORIDE 0.9 % IV BOLUS: 1000.0000 mL | Freq: Once | INTRAVENOUS | Status: DC

## 2018-04-09 NOTE — ED Triage Notes (Signed)
To room via EMS.  Onset 1 - 1 1/2 months crohn's flare up, got worse 3 days ago 04-06-18.  Onset 04-06-18 vomiting x 1, diarrhea x 5 times a day, nausea.  Pt reports feeling weak.

## 2018-04-09 NOTE — ED Notes (Signed)
V.O. For Zofran 63m ODT per SFranchot Heidelberg PA.

## 2018-04-09 NOTE — ED Notes (Signed)
EMS BP 122/88, HR 98, RR 16, SpO2 99%, CBG 142

## 2018-04-09 NOTE — ED Provider Notes (Signed)
[facility] EMERGENCY DEPARTMENT Provider Note   CSN: [id number] Arrival date & time: 04/09/18  1805     History   Chief Complaint Chief Complaint  Patient presents with  . Abdominal Pain    HPI Sydney Deleon is a 27 y.o. female presenting for evaluation of nausea, vomiting, diarrhea, generalized abdominal pain.  Patient states she has a history of Crohn's, states this was diagnosed with a stool sample.  She states that she was seen in [facility] last year for similar symptoms, they were concerned about possible GI infection, and what it was negative and the stool sample, states this is likely IBS/IBD.  Recommended follow-up with gastroenterology, which was never done.  Patient recently moved to the [place] area.  Has not establish care with a PCP or GI doctor.  Patient states over the past several weeks, she has had frequent episodes of diarrhea.  She states intermittently it is streaked with blood.  She states she is having more than 10 stools a day, but told nurse less than 5.  She reports nausea, which is worsened over the past several days.  She had one episode of vomiting on Thursday, none since.  She denies fevers, chills, chest pain, shortness of breath, urinary symptoms.  She states she has had worsened abdominal pain, it is generalized, and with some different areas in her body.  She reports it is a stabbing pain.  Nothing makes it better or worse.  She has been taking Tylenol without improvement of symptoms.  She states she has not had an appetite for several days, due to the nausea.  She has a history of acid reflux, bipolar, and asthma/chronic bronchitis.  She takes citalopram, Zantac, and vitamin daily.  She no longer has Zofran, has not been taking any antinausea medicines.  No history of abdominal surgeries.  No previous history of abdominal problems.  No family history of IBS/IBD.  HPI  Past Medical History:  Diagnosis Date  . Acid reflux   .  Asthma   . Bipolar 1 disorder ([place])   . Chronic bronchitis ([place])   . Depression     Patient Active Problem List   Diagnosis Date Noted  . Depression   . Bipolar 1 disorder ([place])   . Asthma   . Acid reflux     Past Surgical History:  Procedure Laterality Date  . TONSILECTOMY/ADENOIDECTOMY WITH MYRINGOTOMY       OB History    Gravida  2   Para      Term      Preterm      AB      Living        SAB      TAB      Ectopic      Multiple      Live Births               Home Medications    Prior to Admission medications   Medication Sig Start Date End Date Taking? Authorizing Provider  albuterol (PROVENTIL HFA;VENTOLIN HFA) 108 (90 Base) MCG/ACT inhaler Inhale 2 puffs into the lungs every 4 (four) hours as needed for wheezing or shortness of breath.   Yes [provider]  citalopram (CELEXA) 40 MG tablet Take 40 mg by mouth daily.   Yes [provider]  ibuprofen (ADVIL,MOTRIN) 600 MG tablet Take 1 tablet (600 mg total) by mouth every 6 (six) hours as needed. 12/18/17  Yes Leftwich-Kirby, Kathie Dike,  CNM  Multiple Vitamin (MULTIVITAMIN WITH MINERALS) TABS tablet Take 1 tablet by mouth daily.   Yes [provider]  ranitidine (ZANTAC) 150 MG tablet Take 150 mg by mouth 2 (two) times daily.   Yes [provider]  naproxen (NAPROSYN) 500 MG tablet Take 1 tablet (500 mg total) by mouth 2 (two) times daily with a meal. 04/09/18   Keyontae Huckeby, PA-C  ondansetron (ZOFRAN ODT) 4 MG disintegrating tablet Take 1 tablet (4 mg total) by mouth every 8 (eight) hours as needed for nausea or vomiting. 04/09/18   Cordale Manera, PA-C  phenazopyridine (PYRIDIUM) 200 MG tablet Take 1 tablet (200 mg total) by mouth 3 (three) times daily. Patient not taking: Reported on 12/18/2017 12/03/17   Larene Pickett, PA-C    Family History History reviewed. No pertinent family history.  Social History Social History   Tobacco Use  . Smoking status:  Never Smoker  . Smokeless tobacco: Never Used  Substance Use Topics  . Alcohol use: No  . Drug use: No     Allergies   Patient has no known allergies.   Review of Systems Review of Systems  Gastrointestinal: Positive for abdominal pain, diarrhea, nausea and vomiting.  All other systems reviewed and are negative.    Physical Exam Updated Vital Signs BP 120/76   Pulse 81   Temp 98.2 F (36.8 C) (Oral)   Resp (!) 21   LMP 11/07/2017   SpO2 100%   Breastfeeding? No   Physical Exam  Constitutional: She is oriented to person, place, and time. She appears well-developed and well-nourished. No distress.  Patient appears in no distress.  HENT:  Head: Normocephalic and atraumatic.  MM moist  Eyes: Pupils are equal, round, and reactive to light. Conjunctivae and EOM are normal.  Neck: Normal range of motion. Neck supple.  Cardiovascular: Normal rate, regular rhythm and intact distal pulses.  Pulmonary/Chest: Effort normal and breath sounds normal. No respiratory distress. She has no wheezes.  Speaking in full sentences.  Clear lung sounds.  Abdominal: Soft. Bowel sounds are normal. She exhibits no distension and no mass. There is tenderness. There is no rebound and no guarding.  Generalized tenderness to palpation of the abdomen.  Soft without rigidity, guarding, or distention.  Bowel sounds normoactive.  Negative rebound.  Musculoskeletal: Normal range of motion.  Neurological: She is alert and oriented to person, place, and time.  Skin: Skin is warm and dry.  Psychiatric: She has a normal mood and affect.  Nursing note and vitals reviewed.    ED Treatments / Results  Labs (all labs ordered are listed, but only abnormal results are displayed) Labs Reviewed  COMPREHENSIVE METABOLIC PANEL - Abnormal; Notable for the following components:      Result Value   Glucose, Bld 108 (*)    Albumin 3.4 (*)    AST 13 (*)    ALT 11 (*)    All other components within normal limits   CBC - Abnormal; Notable for the following components:   RBC 3.69 (*)    Hemoglobin 10.7 (*)    HCT 34.4 (*)    All other components within normal limits  URINALYSIS, ROUTINE W REFLEX MICROSCOPIC - Abnormal; Notable for the following components:   APPearance HAZY (*)    Hgb urine dipstick MODERATE (*)    Ketones, ur 20 (*)    All other components within normal limits  LIPASE, BLOOD  I-STAT BETA HCG BLOOD, ED ([facility], [facility], [facility] ONLY)  EKG None  Radiology Ct Renal Stone Study  Result Date: 04/09/2018 CLINICAL DATA:  27 year old female with hematuria of unknown cause. EXAM: CT ABDOMEN AND PELVIS WITHOUT CONTRAST TECHNIQUE: Multidetector CT imaging of the abdomen and pelvis was performed following the standard protocol without IV contrast. COMPARISON:  None. FINDINGS: Evaluation of this exam is limited in the absence of intravenous contrast. Lower chest: Minimal bibasilar dependent atelectatic changes. No intra-abdominal free air or free fluid. Hepatobiliary: No focal liver abnormality is seen. No gallstones, gallbladder wall thickening, or biliary dilatation. Pancreas: Unremarkable. No pancreatic ductal dilatation or surrounding inflammatory changes. Spleen: Normal in size without focal abnormality. Adrenals/Urinary Tract: Adrenal glands are unremarkable. Kidneys are normal, without renal calculi, focal lesion, or hydronephrosis. Bladder is unremarkable. Stomach/Bowel: There is no bowel obstruction. There is liquid stool throughout the colon compatible with diarrheal state. Correlation with clinical exam and stool cultures recommended. Minimal apparent thickening of the colonic wall, likely reactive. The appendix is normal. Vascular/Lymphatic: The abdominal aorta and IVC are unremarkable on this noncontrast CT. No portal venous gas. Mildly enlarged peripancreatic and mesenteric lymph nodes, likely reactive. Reproductive: The uterus and ovaries are grossly unremarkable. Other: None Musculoskeletal: No  acute or significant osseous findings. IMPRESSION: 1. No hydronephrosis or nephrolithiasis. 2. Diarrheal state. Correlation with clinical exam and stool cultures recommended. No bowel obstruction. Normal appendix. 3. Mildly enlarged peripancreatic and mesenteric lymph nodes, likely reactive. Clinical correlation is recommended. Electronically Signed   By: Anner Crete M.D.   On: 04/09/2018 22:00    Procedures Procedures (including critical care time)  Medications Ordered in ED Medications  ondansetron (ZOFRAN-ODT) disintegrating tablet 4 mg (4 mg Oral Given 04/09/18 2031)  naproxen (NAPROSYN) tablet 500 mg (500 mg Oral Given 04/09/18 2031)     Initial Impression / Assessment and Plan / ED Course  I have reviewed the triage vital signs and the nursing notes.  Pertinent labs & imaging results that were available during my care of the patient were reviewed by me and considered in my medical decision making (see chart for details).     Pt presenting for evaluation of abdominal pain.  Physical exam reassuring, she is afebrile not tachycardic.  She appears nontoxic.  She does not appear overly dehydrated.  Initially, patient refusing blood draw.  She does not want an IV.,  After discussion, patient is agreeable to blood draw.  Will obtain basic abdominal labs and hCG.  UA ordered.  Zofran and naproxen given for pain, as patient is refusing IV medications.  Encouraged fluids.  Labs reassuring, no leukocytosis.  Hemoglobin stable.  Creatinine stable.  No significant dehydration.  Doubt appendicitis, cholecystitis, or surgical abdomen.  Urine shows moderate blood, no UTI.  Will obtain CT renal to rule out kidney stone.  On reassessment, patient reports nausea has resolved.  Pain has improved with naproxen.  CT shows a diarrheal state.  Patient without fevers, and history of similar.  I doubt this is due to infection, consider IBS/IBD.  Discussed importance of follow-up with GI.  Will treat  symptomatically with naproxen and Zofran.  At this time, patient appears safe for discharge.  Return precautions given.  Patient states she understands and agrees to plan.   Final Clinical Impressions(s) / ED Diagnoses   Final diagnoses:  Nausea vomiting and diarrhea  Generalized abdominal pain    ED Discharge Orders        Ordered    naproxen (NAPROSYN) 500 MG tablet  2 times daily with meals  04/09/18 2240    ondansetron (ZOFRAN ODT) 4 MG disintegrating tablet  Every 8 hours PRN     04/09/18 2240       Zebulen Simonis, PA-C 04/09/18 2305    Fredia Sorrow, MD 04/12/18 [phone]

## 2018-04-09 NOTE — Discharge Instructions (Signed)
Take naproxen 2 times a day with meals.  Do not take other anti-inflammatories at the same time open (Advil, Motrin, ibuprofen, Aleve). You may supplement with Tylenol if you need further pain control. Use heating pads if this helps control your pain. Take zofran as needed for nausea or vomiting.  Make sure you are staying well hydrated with water.  It is important that you follow up with Gi for further evaluation and management.  Return to the ER if you develop fevers, persistent vomiting, or any new or concerning symptoms.

## 2018-04-09 NOTE — ED Notes (Signed)
Pt refused blood draw

## 2018-04-10 ENCOUNTER — Encounter: Payer: Self-pay | Admitting: Gastroenterology

## 2018-04-26 ENCOUNTER — Encounter: Payer: Self-pay | Admitting: Gastroenterology

## 2018-04-26 ENCOUNTER — Other Ambulatory Visit (INDEPENDENT_AMBULATORY_CARE_PROVIDER_SITE_OTHER): Payer: Self-pay

## 2018-04-26 ENCOUNTER — Ambulatory Visit (INDEPENDENT_AMBULATORY_CARE_PROVIDER_SITE_OTHER): Payer: PRIVATE HEALTH INSURANCE | Admitting: Gastroenterology

## 2018-04-26 VITALS — BP 122/74 | HR 86 | Ht 65.0 in | Wt 227.0 lb

## 2018-04-26 DIAGNOSIS — R1084 Generalized abdominal pain: Secondary | ICD-10-CM | POA: Diagnosis not present

## 2018-04-26 DIAGNOSIS — R112 Nausea with vomiting, unspecified: Secondary | ICD-10-CM

## 2018-04-26 DIAGNOSIS — R197 Diarrhea, unspecified: Secondary | ICD-10-CM | POA: Diagnosis not present

## 2018-04-26 LAB — HIGH SENSITIVITY CRP: CRP HIGH SENSITIVITY: 21.86 mg/L — AB (ref 0.000–5.000)

## 2018-04-26 LAB — TSH: TSH: 0.96 u[IU]/mL (ref 0.35–4.50)

## 2018-04-26 LAB — SEDIMENTATION RATE: SED RATE: 85 mm/h — AB (ref 0–20)

## 2018-04-26 LAB — IGA: IgA: 379 mg/dL — ABNORMAL HIGH (ref 68–378)

## 2018-04-26 MED ORDER — NA SULFATE-K SULFATE-MG SULF 17.5-3.13-1.6 GM/177ML PO SOLN: 1.0000 | ORAL | 0 refills | Status: DC

## 2018-04-26 MED ORDER — ONDANSETRON 4 MG PO TBDP: 4.0000 mg | ORAL_TABLET | Freq: Three times a day (TID) | ORAL | 0 refills | Status: DC | PRN

## 2018-04-26 MED ORDER — DIPHENOXYLATE-ATROPINE 2.5-0.025 MG PO TABS: 2.0000 | ORAL_TABLET | Freq: Three times a day (TID) | ORAL | 1 refills | Status: DC | PRN

## 2018-04-26 NOTE — Progress Notes (Signed)
04/26/2018 Sydney Deleon [id number] 09-12-1991   HISTORY OF PRESENT ILLNESS: This is a 27 year old female who is new to our office.  She presents here today with complaints of abdominal pain and diarrhea.  She says that the symptoms started sometime last spring.  She says that when it first started she thought it was a GI illness/bug, but symptoms have never resolved.  Prior to this she has never had any ongoing GI symptoms or complaints.  She says that her diarrhea has been quite severe with multiple bowel movements a day and nocturnal bowel movements as well.  She says that even drinking water gives her diarrhea.  She has been able unable to work because she has some much diarrhea.  Imodium does not help.  Her abdominal pain is diffuse.  She has not had any rectal bleeding.  She is had intermittent episodes of nausea and vomiting as well.  These seem to be random.  She says that one time last year when the symptoms first began she had some vomiting and had vomited some blood.  Has not had any recurrence of that issue.  Complains of severe fatigue and weakness at times.  She does take Zantac 150 mg twice daily for heartburn and reflux, which she feels like helps well.  CBC shows a slightly low hemoglobin at 10.7 g.  CMP and lipase okay.  Stool studies last year when this began were apparently negative although I do not have those results.    CT scan abdomen and pelvis with contrast in 12/2016, which was around the time that her symptoms began showed the following:  IMPRESSION: 1. Mesenteric adenopathy, particularly in the right side of the abdomen measuring up to 13 mm in the right lower quadrant. These node are nonspecific but could represent mesenteric adenitis. If there is no clinical or laboratory evidence to suggest alymphoproliferative disorder, recommend a three-month  follow-up CT scan for further evaluation. If there is clinical or laboratory evidence for a lymphoproliferative  disorder, consider biopsy. 2. The colon is poorly evaluated due to lack of contrast in the colon and poor distention. No pericolonic stranding. No definitive evidence of colitis. However, given history and limited evaluation, it would be difficult to completely exclude mild wall thickening/colitis. Recommend clinical correlation and follow-up as clinically warranted.  CT scan abdomen and pelvis without contrast on 04/09/2018 showed the following:  IMPRESSION: 1. No hydronephrosis or nephrolithiasis. 2. Diarrheal state. Correlation with clinical exam and stool cultures recommended. No bowel obstruction. Normal appendix. 3. Mildly enlarged peripancreatic and mesenteric lymph nodes, likely reactive. Clinical correlation is recommended.   Past Medical History:  Diagnosis Date  . Acid reflux   . Anxiety   . Asthma   . Bipolar 1 disorder ([place])   . Chronic bronchitis ([place])   . Depression    Past Surgical History:  Procedure Laterality Date  . TONSILECTOMY/ADENOIDECTOMY WITH MYRINGOTOMY      reports that she has never smoked. She has never used smokeless tobacco. She reports that she does not drink alcohol or use drugs. family history includes Breast cancer in her cousin and cousin; Colon cancer in her cousin; Hypertension in her mother; Throat cancer in her paternal grandfather. No Known Allergies    Outpatient Encounter Medications as of 04/26/2018  Medication Sig  . albuterol (PROVENTIL HFA;VENTOLIN HFA) 108 (90 Base) MCG/ACT inhaler Inhale 2 puffs into the lungs every 4 (four) hours as needed for wheezing or shortness of breath.  . citalopram (  CELEXA) 40 MG tablet Take 40 mg by mouth daily.  . feeding supplement (BOOST HIGH PROTEIN) LIQD Take 1 Container by mouth daily.  Marland Kitchen ibuprofen (ADVIL,MOTRIN) 600 MG tablet Take 1 tablet (600 mg total) by mouth every 6 (six) hours as needed.  . Multiple Vitamin (MULTIVITAMIN WITH MINERALS) TABS tablet Take 1 tablet by mouth daily.  . naproxen  (NAPROSYN) 500 MG tablet Take 1 tablet (500 mg total) by mouth 2 (two) times daily with a meal.  . ondansetron (ZOFRAN ODT) 4 MG disintegrating tablet Take 1 tablet (4 mg total) by mouth every 8 (eight) hours as needed for nausea or vomiting.  . phenazopyridine (PYRIDIUM) 200 MG tablet Take 1 tablet (200 mg total) by mouth 3 (three) times daily.  . Probiotic Product ([place]) Take by mouth daily.  . ranitidine (ZANTAC) 150 MG tablet Take 150 mg by mouth 2 (two) times daily.   No facility-administered encounter medications on file as of 04/26/2018.      REVIEW OF SYSTEMS  : All other systems reviewed and negative except where noted in the History of Present Illness.   PHYSICAL EXAM: BP 122/74   Pulse 86   Ht 5' 5"  (1.651 m)   Wt 227 lb (103 kg)   LMP 03/27/2018 (Approximate)   BMI 37.77 kg/m  General: Well developed black female in no acute distress Head: Normocephalic and atraumatic Eyes:  Sclerae anicteric, conjunctiva pink. Ears: Normal auditory acuity Lungs: Clear throughout to auscultation; no increased WOB. Heart: Regular rate and rhythm; no M/R/G. Abdomen: Soft, non-distended.  BS present.  Diffuse TTP. Rectal:  Will be done at the time of colonoscopy. Musculoskeletal: Symmetrical with no gross deformities  Skin: No lesions on visible extremities Extremities: No edema  Neurological: Alert oriented x 4, grossly non-focal Psychological:  Alert and cooperative. Normal mood and affect  ASSESSMENT AND PLAN: *27 year old female with 1 year of diarrhea and abdominal pain along with intermittent nausea/vomiting and complaints of fatigue.  Symptoms seem quite dramatic.  She has had 2 CT scans, which were not exactly specific, but not normal.  We will schedule for colonoscopy with Dr. [name].  We will perform lab studies to include TSH, sed rate, CRP, and celiac studies.  She can try Lomotil for her diarrhea in the interim and prescription will be given.  She is asking  about FMLA paperwork and she was given information on where to take that and that it would all need to be determined once we have a diagnosis.  **The risks, benefits, and alternatives were discussed with the patient and she consents to proceed.    CC:  No ref. provider found

## 2018-04-26 NOTE — Patient Instructions (Addendum)
You have been scheduled for a colonoscopy. Please follow written instructions given to you at your visit today.  Please pick up your prep supplies at the pharmacy within the next 1-3 days. If you use inhalers (even only as needed), please bring them with you on the day of your procedure. Your physician has requested that you go to www.startemmi.com and enter the access code given to you at your visit today. This web site gives a general overview about your procedure. However, you should still follow specific instructions given to you by our office regarding your preparation for the procedure.  Your provider has requested that you go to the basement level for lab work before leaving today. Press "B" on the elevator. The lab is located at the first door on the left as you exit the elevator.  We have sent the following medications to your pharmacy for you to pick up at your convenience: Zofran 4 mg as needed for nausea  Lomotil 1-2 tabs every 8 hours as needed for diarrhea  Please see attached sheet for FMLA paperwork

## 2018-04-26 NOTE — Progress Notes (Signed)
Case discussed briefly with physician assistant. Assessment and plan reviewed

## 2018-04-27 LAB — TISSUE TRANSGLUTAMINASE, IGA: (TTG) AB, IGA: 1 U/mL

## 2018-04-28 ENCOUNTER — Ambulatory Visit (AMBULATORY_SURGERY_CENTER): Payer: PRIVATE HEALTH INSURANCE | Admitting: Internal Medicine

## 2018-04-28 ENCOUNTER — Encounter: Payer: Self-pay | Admitting: Internal Medicine

## 2018-04-28 VITALS — BP 92/62 | HR 75 | Temp 98.0°F | Resp 21 | Ht 65.0 in | Wt 227.0 lb

## 2018-04-28 DIAGNOSIS — R1084 Generalized abdominal pain: Secondary | ICD-10-CM

## 2018-04-28 DIAGNOSIS — R197 Diarrhea, unspecified: Secondary | ICD-10-CM | POA: Diagnosis not present

## 2018-04-28 DIAGNOSIS — K529 Noninfective gastroenteritis and colitis, unspecified: Secondary | ICD-10-CM | POA: Diagnosis not present

## 2018-04-28 DIAGNOSIS — K5180 Other ulcerative colitis without complications: Secondary | ICD-10-CM | POA: Diagnosis not present

## 2018-04-28 MED ORDER — MESALAMINE 1.2 G PO TBEC: DELAYED_RELEASE_TABLET | ORAL | 11 refills | Status: DC

## 2018-04-28 MED ORDER — PREDNISONE 20 MG PO TABS: 20.0000 mg | ORAL_TABLET | Freq: Every day | ORAL | 3 refills | Status: DC

## 2018-04-28 NOTE — Progress Notes (Signed)
A/ox3 pleased with MAC, report to RN 

## 2018-04-28 NOTE — Patient Instructions (Signed)
YOU HAD AN ENDOSCOPIC PROCEDURE TODAY AT [facility]:   Refer to the procedure report that was given to you for any specific questions about what was found during the examination.  If the procedure report does not answer your questions, please call your gastroenterologist to clarify.  If you requested that your care partner not be given the details of your procedure findings, then the procedure report has been included in a sealed envelope for you to review at your convenience later.  YOU SHOULD EXPECT: Some feelings of bloating in the abdomen. Passage of more gas than usual.  Walking can help get rid of the air that was put into your GI tract during the procedure and reduce the bloating. If you had a lower endoscopy (such as a colonoscopy or flexible sigmoidoscopy) you may notice spotting of blood in your stool or on the toilet paper. If you underwent a bowel prep for your procedure, you may not have a normal bowel movement for a few days.  Please Note:  You might notice some irritation and congestion in your nose or some drainage.  This is from the oxygen used during your procedure.  There is no need for concern and it should clear up in a day or so.  SYMPTOMS TO REPORT IMMEDIATELY:   Following lower endoscopy (colonoscopy or flexible sigmoidoscopy):  Excessive amounts of blood in the stool  Significant tenderness or worsening of abdominal pains  Swelling of the abdomen that is new, acute  Fever of 100F or higher   For urgent or emergent issues, a gastroenterologist can be reached at any hour by calling [phone].   DIET:  We do recommend a small meal at first, but then you may proceed to your regular diet.  Drink plenty of fluids but you should avoid alcoholic beverages for 24 hours.  ACTIVITY:  You should plan to take it easy for the rest of today and you should NOT DRIVE or use heavy machinery until tomorrow (because of the sedation medicines used during the test).     FOLLOW UP: Our staff will call the number listed on your records the next business day following your procedure to check on you and address any questions or concerns that you may have regarding the information given to you following your procedure. If we do not reach you, we will leave a message.  However, if you are feeling well and you are not experiencing any problems, there is no need to return our call.  We will assume that you have returned to your regular daily activities without incident.  If any biopsies were taken you will be contacted by phone or by letter within the next 1-3 weeks.  Please call us at [phone] if you have not heard about the biopsies in 3 weeks.    SIGNATURES/CONFIDENTIALITY: You and/or your care partner have signed paperwork which will be entered into your electronic medical record.  These signatures attest to the fact that that the information above on your After Visit Summary has been reviewed and is understood.  Full responsibility of the confidentiality of this discharge information lies with you and/or your care-partner.   Thank you for allowing [place] to provide your healthcare today.

## 2018-04-28 NOTE — Op Note (Signed)
Happy Patient Name: Sydney Deleon Procedure Date: 04/28/2018 1:36 PM MRN: [id number] Endoscopist: Docia Chuck. Sydney Deleon , MD Age: 27 Referring MD:  Date of Birth: Apr 27, 1991 Gender: Female Account #: [id number] Procedure:                Colonoscopy, with biopsies Indications:              Abdominal pain, Chronic diarrhea, Iron deficiency                            anemia Medicines:                Monitored Anesthesia Care Procedure:                Pre-Anesthesia Assessment:                           - Prior to the procedure, a History and Physical                            was performed, and patient medications and                            allergies were reviewed. The patient's tolerance of                            previous anesthesia was also reviewed. The risks                            and benefits of the procedure and the sedation                            options and risks were discussed with the patient.                            All questions were answered, and informed consent                            was obtained. Prior Anticoagulants: The patient has                            taken no previous anticoagulant or antiplatelet                            agents. ASA Grade Assessment: II - A patient with                            mild systemic disease. After reviewing the risks                            and benefits, the patient was deemed in                            satisfactory condition to undergo the procedure.  After obtaining informed consent, the colonoscope                            was passed under direct vision. Throughout the                            procedure, the patient's blood pressure, pulse, and                            oxygen saturations were monitored continuously. The                            Colonoscope was introduced through the anus and                            advanced to the the cecum, identified  by                            appendiceal orifice and ileocecal valve. The                            terminal ileum, ileocecal valve, appendiceal                            orifice, and rectum were photographed. The quality                            of the bowel preparation was excellent. The                            colonoscopy was performed without difficulty. The                            patient tolerated the procedure well. The bowel                            preparation used was SUPREP. Scope In: 1:39:35 PM Scope Out: 1:59:17 PM Scope Withdrawal Time: 0 hours 16 minutes 21 seconds  Total Procedure Duration: 0 hours 19 minutes 42 seconds  Findings:                 The terminal ileum appeared normal. This was                            biopsied with a cold forceps for histology.                           The colonic mucosa was diffusely erythematous.                            There were multiple aphthoid ulcers in the proximal                            two thirds of the colon. Some mucous. No  spontaneous bleeding. The changes were consistent                            with inflammatory bowel disease. The changes were                            mild to moderate. Biopsies were taken with a cold                            forceps for histology.                           The exam was otherwise without abnormality on                            direct and retroflexion views. Complications:            No immediate complications. Estimated blood loss:                            None. Estimated Blood Loss:     Estimated blood loss: none. Impression:               - The examined portion of the ileum was normal.                            Biopsied.                           - Colitis consistent with idiopathic inflammatory                            bowel disease. Biopsied.                           - The examination was otherwise normal on direct                             and retroflexion views. Recommendation:           1. Prescribe Lialda 1.2 g; #120; 4 by mouth daily                            (total for 4.8 g); 11 refills                           2. Prescribed prednisone 20 mg; #30; 3 refills; 20                            mg by mouth daily                           3. Follow-up biopsies                           4. Office follow-up with [name] in 1-2 weeks. Docia Chuck. Sydney Pastor, MD 04/28/2018 2:17:36 PM This report has  been signed electronically.

## 2018-04-28 NOTE — Progress Notes (Signed)
Called to room to assist during endoscopic procedure.  Patient ID and intended procedure confirmed with present staff. Received instructions for my participation in the procedure from the performing physician.  

## 2018-05-01 ENCOUNTER — Telehealth: Payer: Self-pay | Admitting: *Deleted

## 2018-05-01 ENCOUNTER — Other Ambulatory Visit: Payer: Self-pay

## 2018-05-01 MED ORDER — MESALAMINE ER 0.375 G PO CP24: 1500.0000 mg | ORAL_CAPSULE | Freq: Every day | ORAL | 6 refills | Status: DC

## 2018-05-01 NOTE — Telephone Encounter (Signed)
Spoke with pt and she knows to check with her pharmacy.

## 2018-05-01 NOTE — Telephone Encounter (Signed)
  Follow up Call-  Call back number 04/28/2018  Post procedure Call Back phone  # [phone]  Permission to leave phone message Yes  Some recent data might be hidden     Patient questions:  Do you have a fever, pain , or abdominal swelling? No. Pain Score  0 *  Have you tolerated food without any problems? Yes.    Have you been able to return to your normal activities? Yes.    Do you have any questions about your discharge instructions: Diet   No. Medications  Yes.   Follow up visit  No.  Do you have questions or concerns about your Care? No.  Actions: * If pain score is 4 or above: No action needed, pain <4. Pt said she was unable to get Lialda that was oredered for he on 04/28/18 because it would have cost her over $ 1000.00. Pt wants to know if there something that can be ordered in place of Lialda.

## 2018-05-01 NOTE — Telephone Encounter (Signed)
Sydney Deleon, I thought Medicaid coverage this. Let's try Apriso 1.5 grams daily (4 of the 0.375 mg); one month's worth; multiple refills

## 2018-05-01 NOTE — Telephone Encounter (Signed)
Script sent to the pharmacy.

## 2018-05-08 ENCOUNTER — Encounter: Payer: Self-pay | Admitting: Internal Medicine

## 2018-05-12 ENCOUNTER — Ambulatory Visit (INDEPENDENT_AMBULATORY_CARE_PROVIDER_SITE_OTHER): Payer: PRIVATE HEALTH INSURANCE | Admitting: Gastroenterology

## 2018-05-12 ENCOUNTER — Encounter (INDEPENDENT_AMBULATORY_CARE_PROVIDER_SITE_OTHER): Payer: Self-pay

## 2018-05-12 ENCOUNTER — Encounter: Payer: Self-pay | Admitting: Gastroenterology

## 2018-05-12 VITALS — BP 108/70 | HR 80 | Ht 65.0 in | Wt 230.2 lb

## 2018-05-12 DIAGNOSIS — K5190 Ulcerative colitis, unspecified, without complications: Secondary | ICD-10-CM | POA: Insufficient documentation

## 2018-05-12 DIAGNOSIS — K5180 Other ulcerative colitis without complications: Secondary | ICD-10-CM | POA: Diagnosis not present

## 2018-05-12 NOTE — Patient Instructions (Signed)
Get Apriso ASAP and start it.   [facility] is located at [address], [place],  [address] 3rd floor Monday-Friday 8am-5 pm  Take 1/2 dose of Miralax daily.

## 2018-05-12 NOTE — Progress Notes (Signed)
05/12/2018 Sydney Deleon [id number] 05-18-1991   HISTORY OF PRESENT ILLNESS: This is a 27 year old female who was seen by me on April 26, 2018 for complaints of diarrhea abdominal pain.  She underwent a colonoscopy on 04/28/2018 by Dr. [name] at which time she was found to have the following:  - The colonic mucosa was diffusely erythematous. There were multiple aphthoid ulcers in the proximal two thirds of the colon. Some mucous. No spontaneous bleeding. The changes were consistent with inflammatory bowel disease. The changes were mild to moderate. Biopsies were taken with a cold forceps for histology.  Biopsies of the terminal ileum were normal.  Biopsies of the ascending and descending colon showed moderately to severely active chronic colitis consistent with IBD.  She was started on prednisone 20 mg daily and was initially also given a prescription for Lialda 4.8 grams daily, but that was too expensive so it was switched to Apriso 4 pills daily.  She has not yet picked up that prescription, however.  She is on prednisone 20 mg and has noticed improvement with that as she is no longer having diarrhea, in fact, she is sometimes going 3 to 4 days without a bowel movement and then having some straining.  Her abdominal pain has improved.   Past Medical History:  Diagnosis Date  . Acid reflux   . Allergy   . Anxiety   . Arthritis   . Asthma   . Bipolar 1 disorder ([place])   . Chronic bronchitis ([place])   . Depression   . Miscarriage within last 12 months    Past Surgical History:  Procedure Laterality Date  . TONSILECTOMY/ADENOIDECTOMY WITH MYRINGOTOMY      reports that she has never smoked. She has never used smokeless tobacco. She reports that she does not drink alcohol or use drugs. family history includes Breast cancer in her cousin and cousin; Colon cancer in her cousin; Hypertension in her mother; Rectal cancer in her cousin; Throat cancer in her paternal grandfather. No Known  Allergies    Outpatient Encounter Medications as of 05/12/2018  Medication Sig  . albuterol (PROVENTIL HFA;VENTOLIN HFA) 108 (90 Base) MCG/ACT inhaler Inhale 2 puffs into the lungs every 4 (four) hours as needed for wheezing or shortness of breath.  . citalopram (CELEXA) 40 MG tablet Take 40 mg by mouth daily.  . diphenoxylate-atropine (LOMOTIL) 2.5-0.025 MG tablet Take 2 tablets by mouth every 8 (eight) hours as needed for diarrhea or loose stools.  . feeding supplement (BOOST HIGH PROTEIN) LIQD Take 1 Container by mouth daily.  Marland Kitchen ibuprofen (ADVIL,MOTRIN) 600 MG tablet Take 1 tablet (600 mg total) by mouth every 6 (six) hours as needed.  . mesalamine (APRISO) 0.375 g 24 hr capsule Take 4 capsules (1.5 g total) by mouth daily.  . mesalamine (LIALDA) 1.2 g EC tablet 4 tabs by mouth daily (total 4.8 grams)  . Multiple Vitamin (MULTIVITAMIN WITH MINERALS) TABS tablet Take 1 tablet by mouth daily.  . naproxen (NAPROSYN) 500 MG tablet Take 1 tablet (500 mg total) by mouth 2 (two) times daily with a meal.  . ondansetron (ZOFRAN ODT) 4 MG disintegrating tablet Take 1 tablet (4 mg total) by mouth every 8 (eight) hours as needed for nausea or vomiting.  . phenazopyridine (PYRIDIUM) 200 MG tablet Take 1 tablet (200 mg total) by mouth 3 (three) times daily.  . predniSONE (DELTASONE) 20 MG tablet Take 1 tablet (20 mg total) by mouth daily with breakfast.  . Probiotic  Product ([place]) Take by mouth daily.  . ranitidine (ZANTAC) 150 MG tablet Take 150 mg by mouth 2 (two) times daily.   No facility-administered encounter medications on file as of 05/12/2018.      REVIEW OF SYSTEMS  : All other systems reviewed and negative except where noted in the History of Present Illness.   PHYSICAL EXAM: Ht 5' 5"  (1.651 m)   Wt 230 lb 3.2 oz (104.4 kg)   LMP 04/28/2018   BMI 38.31 kg/m  General: Well developed black female in no acute distress Head: Normocephalic and atraumatic Eyes:  Sclerae  anicteric, conjunctiva pink. Ears: Normal auditory acuity Lungs: Clear throughout to auscultation; no increased WOB. Heart: Regular rate and rhythm; no M/R/G. Abdomen: Soft, non-distended.  BS present.  Mild diffuse TTP. Musculoskeletal: Symmetrical with no gross deformities  Skin: No lesions on visible extremities Extremities: No edema  Neurological: Alert oriented x 4, grossly non-focal Psychological:  Alert and cooperative. Normal mood and affect  ASSESSMENT AND PLAN: *27 year old female with newly diagnosed moderate to severe pancolitis.  Seems to be improved significantly on prednisone 20 mg daily for the past 2 weeks.  She has not picked up her prescription for Apriso.  I told her that she absolutely needs to get this medication as this will be her first attempt to long-term treatment for this condition.  I had a long discussion with her and her mother about her condition.  She is going to get the medication and begin taking 4 pills daily.  I will keep her on prednisone 20 mg daily for now.  I am going to have her follow-up in our office again in about 2 to 3 weeks at which time we can reassess and consider slowly tapering her prednisone.  Hopefully she does well on oral mesalamine, but she may very well end up needing Biologics.  She is no longer having diarrhea at this point and is actually having some mild constipation.  I have asked her to begin at half a dose of MiraLAX daily to start.  *25 mins spent with the patient in which at least 50% was spent discussing her condition and reviewing treatment options.   CC:  No ref. provider found

## 2018-05-12 NOTE — Progress Notes (Signed)
Agree with assessment and plans

## 2018-05-17 ENCOUNTER — Telehealth: Payer: Self-pay | Admitting: Gastroenterology

## 2018-05-17 NOTE — Telephone Encounter (Signed)
Patient states medication mesalamine is too expensive and would like something else called in.

## 2018-05-18 MED ORDER — MESALAMINE 800 MG PO TBEC: 2400.0000 mg | DELAYED_RELEASE_TABLET | Freq: Every day | ORAL | 3 refills | Status: DC

## 2018-05-18 NOTE — Telephone Encounter (Signed)
Asacol 2.4 g twice a day. If this is not acceptable please identify the affordable mesalamine agents before rerouting another message, thank you.

## 2018-05-18 NOTE — Telephone Encounter (Signed)
I am forwarding to Dr. [name] as well to see what he prefers.  Dr. [name], she could not afford Lialda or Apriso.  Try for Asacol/Delzicol or Colazol?  Thank you,  Jess

## 2018-05-18 NOTE — Telephone Encounter (Signed)
Sent Rx for Asacol to pharmacy. Patients phone goes straight to voicemail and it has not been set up so could not leave a message.

## 2018-05-18 NOTE — Telephone Encounter (Signed)
Please advise an alternative medication?

## 2018-05-23 NOTE — Congregational Nurse Program (Signed)
Congregational Nurse Program Note  Date of Encounter: 05/23/2018  Past Medical History: Past Medical History:  Diagnosis Date  . Acid reflux   . Allergy   . Anxiety   . Arthritis   . Asthma   . Bipolar 1 disorder ([place])   . Chronic bronchitis ([place])   . Depression   . Miscarriage within last 12 months     Encounter Details: CNP Questionnaire - 05/17/18 1400      Questionnaire   Patient Status  Not Applicable    Race  Black or African American    Location Patient Served At  Not Applicable    Insurance  Not Applicable    Uninsured  Not Applicable    Food  No food insecurities    Housing/Utilities  No permanent housing    Transportation  Yes, need transportation assistance    Interpersonal Safety  Yes, feel physically and emotionally safe where you currently live    Medication  Yes, have medication insecurities    Medical Provider  No    Referrals  Primary Care Provider/Clinic;Area Agency    ED Visit Averted  Not Applicable    Life-Saving Intervention Made  Not Applicable     Initial visit to see nurse ,states she cant afford her medications ,TC to client"s MD office and spoke with nurse regarding clients need for a cheaper medication . Nurse to get with client"s MD and have him call in a new prescription and will let client know ,vertified clients phone number.Client has ulcerative colitis seen at [facility] clinic  States she also has asthma  And suffers from depression. 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  Client will let nurse know if she doesn't get a call and if she doesn't get her medication switched from  Mesalamine 0.375 g 4 capsules by mouth  daily    (APRISO )

## 2018-05-31 ENCOUNTER — Ambulatory Visit: Payer: PRIVATE HEALTH INSURANCE | Admitting: Gastroenterology

## 2018-05-31 ENCOUNTER — Telehealth: Payer: Self-pay | Admitting: Gastroenterology

## 2018-05-31 NOTE — Telephone Encounter (Signed)
Spoke to patient and informed her that we are working to get [employer] approved with some patience assistance.

## 2018-05-31 NOTE — Telephone Encounter (Signed)
Magda Paganini is looking to see how to apply for [facility] assistance for apriso.

## 2018-05-31 NOTE — Telephone Encounter (Signed)
Please advise if switch is ok?

## 2018-06-01 ENCOUNTER — Emergency Department (HOSPITAL_COMMUNITY): Payer: PRIVATE HEALTH INSURANCE

## 2018-06-01 ENCOUNTER — Encounter (HOSPITAL_COMMUNITY): Payer: Self-pay | Admitting: Emergency Medicine

## 2018-06-01 ENCOUNTER — Telehealth: Payer: Self-pay | Admitting: Gastroenterology

## 2018-06-01 ENCOUNTER — Emergency Department (HOSPITAL_COMMUNITY)
Admission: EM | Admit: 2018-06-01 | Discharge: 2018-06-01 | Disposition: A | Discharge status: Home / Self Care | Payer: PRIVATE HEALTH INSURANCE | Attending: Emergency Medicine | Admitting: Emergency Medicine

## 2018-06-01 DIAGNOSIS — R410 Disorientation, unspecified: Secondary | ICD-10-CM | POA: Insufficient documentation

## 2018-06-01 DIAGNOSIS — J45909 Unspecified asthma, uncomplicated: Secondary | ICD-10-CM | POA: Insufficient documentation

## 2018-06-01 DIAGNOSIS — Z79899 Other long term (current) drug therapy: Secondary | ICD-10-CM | POA: Insufficient documentation

## 2018-06-01 DIAGNOSIS — R404 Transient alteration of awareness: Secondary | ICD-10-CM | POA: Insufficient documentation

## 2018-06-01 LAB — URINALYSIS, ROUTINE W REFLEX MICROSCOPIC
BILIRUBIN URINE: NEGATIVE
Glucose, UA: NEGATIVE mg/dL
HGB URINE DIPSTICK: NEGATIVE
KETONES UR: NEGATIVE mg/dL
Leukocytes, UA: NEGATIVE
Nitrite: NEGATIVE
Protein, ur: NEGATIVE mg/dL
SPECIFIC GRAVITY, URINE: 1.015 (ref 1.005–1.030)
pH: 7 (ref 5.0–8.0)

## 2018-06-01 LAB — COMPREHENSIVE METABOLIC PANEL
ALK PHOS: 37 U/L — AB (ref 38–126)
ALT: 12 U/L (ref 0–44)
AST: 13 U/L — AB (ref 15–41)
Albumin: 3.5 g/dL (ref 3.5–5.0)
Anion gap: 9 (ref 5–15)
BUN: 16 mg/dL (ref 6–20)
CALCIUM: 8.9 mg/dL (ref 8.9–10.3)
CHLORIDE: 105 mmol/L (ref 98–111)
CO2: 23 mmol/L (ref 22–32)
CREATININE: 0.83 mg/dL (ref 0.44–1.00)
GFR calc Af Amer: >60 mL/min (ref >60)
GFR calc non Af Amer: >60 mL/min (ref >60)
GLUCOSE: 79 mg/dL (ref 70–99)
Potassium: 3.9 mmol/L (ref 3.5–5.1)
SODIUM: 137 mmol/L (ref 135–145)
Total Bilirubin: 0.5 mg/dL (ref 0.3–1.2)
Total Protein: 7.2 g/dL (ref 6.5–8.1)

## 2018-06-01 LAB — AMMONIA: Ammonia: 16 umol/L (ref 9–35)

## 2018-06-01 LAB — RAPID URINE DRUG SCREEN, HOSP PERFORMED
Amphetamines: NOT DETECTED
BARBITURATES: NOT DETECTED
Benzodiazepines: NOT DETECTED
Cocaine: NOT DETECTED
Opiates: NOT DETECTED
TETRAHYDROCANNABINOL: NOT DETECTED

## 2018-06-01 LAB — CBC
HCT: 35.6 % — ABNORMAL LOW (ref 36.0–46.0)
Hemoglobin: 11.2 g/dL — ABNORMAL LOW (ref 12.0–15.0)
MCH: 28.6 pg (ref 26.0–34.0)
MCHC: 31.5 g/dL (ref 30.0–36.0)
MCV: 91 fL (ref 78.0–100.0)
Platelets: 402 10*3/uL — ABNORMAL HIGH (ref 150–400)
RBC: 3.91 MIL/uL (ref 3.87–5.11)
RDW: 14.3 % (ref 11.5–15.5)
WBC: 7.1 10*3/uL (ref 4.0–10.5)

## 2018-06-01 LAB — I-STAT BETA HCG BLOOD, ED (MC, WL, AP ONLY): I-stat hCG, quantitative: <5 m[IU]/mL (ref <5)

## 2018-06-01 LAB — SALICYLATE LEVEL: Salicylate Lvl: <7 mg/dL (ref 2.8–30.0)

## 2018-06-01 LAB — ACETAMINOPHEN LEVEL

## 2018-06-01 MED ORDER — METOCLOPRAMIDE HCL 5 MG/ML IJ SOLN
10.0000 mg | Freq: Once | INTRAMUSCULAR | Status: AC
  Administered 2018-06-01: 10 mg via INTRAVENOUS
  Filled 2018-06-01: qty 2

## 2018-06-01 MED ORDER — KETOROLAC TROMETHAMINE 15 MG/ML IJ SOLN
15.0000 mg | Freq: Once | INTRAMUSCULAR | Status: AC
  Administered 2018-06-01: 15 mg via INTRAVENOUS
  Filled 2018-06-01: qty 1

## 2018-06-01 MED ORDER — LACTATED RINGERS IV BOLUS
1000.0000 mL | Freq: Once | INTRAVENOUS | Status: AC
  Administered 2018-06-01: 1000 mL via INTRAVENOUS

## 2018-06-01 NOTE — Telephone Encounter (Signed)
Spoke to patient yesterday and informed her we are working on getting her patient assistance with Apriso.

## 2018-06-01 NOTE — Discharge Instructions (Addendum)
Continue taking home medications as previously prescribed.  Follow-up with your family doctor in 2 to 3 days for reevaluation.  Return to the emergency department if symptoms worsen.

## 2018-06-01 NOTE — Telephone Encounter (Signed)
Desiree are you working on this prescription for [employer]?

## 2018-06-01 NOTE — ED Provider Notes (Signed)
Care assumed from Dr. Drue Flirt at 1500.  Patient history of bipolar, depression, IBD.  Altered mental status since 10:30 AM from work.  Confusion, slow speech.  No focal neurological deficits.  Has been able to walk in the emergency department.  So far labs have been unremarkable.  CT of the head shows no acute intracranial abnormality.  No focal deficits to suggest CVA.  No meningismus. Has similar presentation in the past that was felt to be psychogenic.  Plan at handoff is to follow-up remainder of labs and reevaluate patient.  If labs are unremarkable can likely be discharged to home.   Physical Exam  BP 118/67   Pulse 62   Temp 97.8 F (36.6 C) (Oral)   Resp (!) 22   LMP 05/25/2018   SpO2 100%   Physical Exam  ED Course/Procedures   Clinical Course as of Jun 01 1740  Thu Jun 01, 2018  1500 Sydney Deleon presents with altered mental status and difficulty speaking as per above.On exam, her speech is slow.  She is not dysarthric.  Her neurological exam is otherwise normal.  She can ambulate with mild assistance.  Her difficulty walking.  Volitional.  Her reflexes are 2+.The differential for her reported confusion and difficulty speaking is wide.  Her presentation is not consistent with an acute stroke or intracranial hemorrhage.  I do not think that she has meningitis or encephalitis.  She is afebrile and has no meningismus.  Although she takes Celexa, she does not take any psychotics.  Given that she is afebrile with normal reflexes, I have a very low suspicion for serotonin syndrome or NMS.  Additionally, her presentation is not consistent with Guillain-Barr or myasthenia gravis/Lambert-Eaton.  Her presentation is also not consistent with a toxidrome.In triage, she had a CMP and a CBC that revealed no acute abnormalities.  She does not have an anion gap.  Her pregnancy test is negative.  We will obtain a UDS, UA, ammonia, acetaminophen, and salicylate level. Her head CT revealed no acute  abnormalities. Given her reassuring neurological exam and work-up so far, conversion disorder seems most likely.   [NA]    Clinical Course User Index [NA] Alford Highland, MD    Procedures  MDM   Patient reevaluated and resting comfortably.  No distress.  Speaking in full sentences.  Does have slow speech which mother who is at bedside reports is baseline for patient.  She has no focal neurological deficits on my evaluation.  Labs reviewed all of which are unremarkable including tox workup. Pt at her baseline per mother.   On my reeval - Alert and oriented x3.  Cranial nerves II-XII intact.  No facial asymmetry.  5/5 grip strength bilaterally.  5/5 bicep flexion and tricep extension bilaterally. 5/5 flexion/extension at knee, hip, and ankles. No discoordination of FNF, heel to shin, and ambulates without ataxia or antalgic gait.   Given patient has returned to baseline, no findings to suggest meningitis, encephalitis, stroke, no acute intracranial abnormality's on head CT and well appearing with no pain she is stable for discharge to home.  Encouraged close PCP follow-up outpatient.  Case and plan of care discussed with Dr. Zenia Resides.      Sydney Dandy, MD 06/01/18 1924    Sydney Leigh, MD 06/04/18 [phone]

## 2018-06-01 NOTE — ED Provider Notes (Signed)
Patient placed in Quick Look pathway, seen and evaluated   Chief Complaint: facial weakness, headache  HPI:   Sydney Deleon is a 27 y.o. female  from work with [employer] after sudden onset of confusion, tremors, and muscle weakness that waxes and wanes every few minutes. Patient slow to respond, answers questions appropriately, moving all extremities. States her head "feels funny". 20g saline lock in right AC.    ROS: Neuro: right side weakness, change in speech, headache      Physical Exam:  BP 119/83 (BP Location: Left Arm)   Pulse 75   Temp 97.8 F (36.6 C) (Oral)   Resp 16   LMP 11/14/2017   SpO2 98%    Gen: No distress  Neuro: Awake and Alert, slow speech, decreased sensation right side of face, slight right side facial droop. Grips are equal, no pronator drift  Skin: Warm and dry    Initiation of care has begun. The patient has been counseled on the process, plan, and necessity for staying for the completion/evaluation, and the remainder of the medical screening examination    Ashley Murrain, NP 06/01/18 1318    Julianne Rice, MD 06/02/18 [phone]

## 2018-06-01 NOTE — ED Triage Notes (Addendum)
Patient from work with [facility] [employer] [facility] [employer] after sudden onset of confusion, tremors, and muscle weakness that waxes and wanes every few minutes. Patient slow to respond, answers questions appropriately, moving all extremities. States her head "feels funny". 20g saline lock in right AC. Patient in no apparent distress at this time. Grip strength equal bilaterally, no facial droop, no arm drift.

## 2018-06-01 NOTE — ED Provider Notes (Signed)
[facility] EMERGENCY DEPARTMENT Provider Note   CSN: [id number] Arrival date & time: 06/01/18  1154     History   Chief Complaint Chief Complaint  Patient presents with  . Altered Mental Status    HPI Sydney Deleon is a 27 y.o. female is a 27 year old female with a history of depression, bipolar disorder, asthma, and ulcerative colitis who presents via EMS due to altered mental status.  At approximately 1030 to 11 AM, a coworker called Sydney mother and said that she was confused.  Sydney Deleon was told that she was shaking, having difficulty speaking, and was confused.  Sydney Deleon says that she had a similar episode 2 years ago she was diagnosed with ulcerative colitis but no clear explanation was offered.  She has not been sick recently.  She has not had any fevers, chills, nausea, or vomiting.  She denies chest pain no abdominal pain.  She denies dysuria.  She denies taking any medications that she is not exposed.  HPI  Past Medical History:  Diagnosis Date  . Acid reflux   . Allergy   . Anxiety   . Arthritis   . Asthma   . Bipolar 1 disorder ([place])   . Chronic bronchitis ([place])   . Depression   . Miscarriage within last 12 months     Patient Active Problem List   Diagnosis Date Noted  . Ulcerative colitis ([place]) 05/12/2018  . Diarrhea 04/26/2018  . Generalized abdominal pain 04/26/2018  . Nausea and vomiting 04/26/2018  . Depression   . Bipolar 1 disorder ([place])   . Asthma   . Acid reflux     Past Surgical History:  Procedure Laterality Date  . TONSILECTOMY/ADENOIDECTOMY WITH MYRINGOTOMY       OB History    Gravida  2   Para      Term      Preterm      AB      Living        SAB      TAB      Ectopic      Multiple      Live Births               Home Medications    Prior to Admission medications   Medication Sig Start Date End Date Taking? Authorizing Provider  albuterol (PROVENTIL HFA;VENTOLIN HFA) 108 (90 Base)  MCG/ACT inhaler Inhale 2 puffs into the lungs every 4 (four) hours as needed for wheezing or shortness of breath.   Yes [provider]  citalopram (CELEXA) 40 MG tablet Take 40 mg by mouth daily.   Yes [provider]  diphenoxylate-atropine (LOMOTIL) 2.5-0.025 MG tablet Take 2 tablets by mouth every 8 (eight) hours as needed for diarrhea or loose stools. 04/26/18  Yes Zehr, Janett Billow D, PA-C  feeding supplement (BOOST HIGH PROTEIN) LIQD Take 1 Container by mouth daily.   Yes [provider]  ibuprofen (ADVIL,MOTRIN) 600 MG tablet Take 1 tablet (600 mg total) by mouth every 6 (six) hours as needed. Patient taking differently: Take 600 mg by mouth every 6 (six) hours as needed (for pain).  12/18/17  Yes Leftwich-Kirby, Kathie Dike, CNM  Mesalamine 800 MG TBEC Take 3 tablets (2,400 mg total) by mouth daily. 05/18/18   Irene Shipper, MD  Multiple Vitamin (MULTIVITAMIN WITH MINERALS) TABS tablet Take 1 tablet by mouth daily.    [provider]  naproxen (NAPROSYN) 500 MG tablet Take 1  tablet (500 mg total) by mouth 2 (two) times daily with a meal. 04/09/18   Caccavale, Sophia, PA-C  ondansetron (ZOFRAN ODT) 4 MG disintegrating tablet Take 1 tablet (4 mg total) by mouth every 8 (eight) hours as needed for nausea or vomiting. 04/26/18   Zehr, Laban Emperor, PA-C  phenazopyridine (PYRIDIUM) 200 MG tablet Take 1 tablet (200 mg total) by mouth 3 (three) times daily. 12/03/17   Larene Pickett, PA-C  predniSONE (DELTASONE) 20 MG tablet Take 1 tablet (20 mg total) by mouth daily with breakfast. 04/28/18   Irene Shipper, MD  Probiotic Product ([place]) Take by mouth daily.    [provider]  ranitidine (ZANTAC) 150 MG tablet Take 150 mg by mouth 2 (two) times daily.    [provider]    Family History Family History  Problem Relation Age of Onset  . Hypertension Mother   . Throat cancer Paternal Grandfather   . Colon cancer Cousin        2 cousins  .  Breast cancer Cousin   . Rectal cancer Cousin   . Breast cancer Cousin   . Esophageal cancer Neg Hx   . Stomach cancer Neg Hx     Social History Social History   Tobacco Use  . Smoking status: Never Smoker  . Smokeless tobacco: Never Used  Substance Use Topics  . Alcohol use: No  . Drug use: No     Allergies   Patient has no known allergies.   Review of Systems Review of Systems Review of Systems   Constitutional  Negative for fever  Negative for chills  HENT  Negative for ear pain  Negative for sore throat  Negative for difficultly swallowing  Eyes  Negative for eye pain  Negative for visual disturbance  Respiratory  Negative for shortness of breath  Negative for cough  CV  Negative for chest pain  Negative for leg swelling  Abdomen  Negative for abdominal pain  Negative for nausea  Negative for vomiting  MSK  Negative for extremity pain  Negative for back pain  Skin  Negative for rash  Negative for wound  Neuro  Negative for syncope  +for difficultly speaking  Psych  +for confusion   The remainder of the ROS was reviewed and negative except as documented above.      Physical Exam Updated Vital Signs BP 102/79   Pulse 66   Temp 97.8 F (36.6 C) (Oral)   Resp 18   LMP 05/25/2018   SpO2 100%   Physical Exam Physical Exam Constitutional  Nursing notes reviewed  Vital signs reviewed  HEENT  No obvious trauma  Supple without meningismus, mass, or overt JVD  EOMI  No scleral icterus or injection  Respiratory  Effort normal  CTAB  No respiratory distress  CV  Normal rate  No obvious murmurs  No pitting edema  Abdomen  Soft  Non-tender  Non-distended  No peritonitis  MSK  Atraumatic  No obvious deformity  ROM appropriate  Skin  Warm  Dry  Neuro Component Findings  Mental Status Exam Alert, oriented Memory appropriate Speech is slowed but not slurred  Cranial Nerves   CN II Visual fields  intact to confrontation  CN III, IV, VI PERRL EOMI No nystagmus.   CN V Facial sensation is normal No weakness of masticatory muscles  CN VII No facial weakness or asymmetry  CN VIII Auditory acuity grossly normal  CN IX and X  Uvula is midline Palate elevates symmetrically  CN XI  Normal sternocleidomastoid and trapezius strength  CN XII The tongue is midline No tongue atrophy or fasciculations  Motor   Muscle Strength RUE: 5/5 flexion and extension RLE: 5/5 flexion and extension LUE: 5/5 flexion and extension LLE: 5/5 flexion and extension  No pronation or drift  Muscle Tone Normal bulk and tone  Coordination Mild resting tremor Negative Romberg  Sensation Intact to light touch  Gait Difficultly with routine ambulation; required assistance        Psychiatric  Depressed affect, slow speech        ED Treatments / Results  Labs (all labs ordered are listed, but only abnormal results are displayed) Labs Reviewed  COMPREHENSIVE METABOLIC PANEL - Abnormal; Notable for the following components:      Result Value   AST 13 (*)    Alkaline Phosphatase 37 (*)    All other components within normal limits  CBC - Abnormal; Notable for the following components:   Hemoglobin 11.2 (*)    HCT 35.6 (*)    Platelets 402 (*)    All other components within normal limits  RAPID URINE DRUG SCREEN, HOSP PERFORMED  ACETAMINOPHEN LEVEL  SALICYLATE LEVEL  URINALYSIS, ROUTINE W REFLEX MICROSCOPIC  AMMONIA  CBG MONITORING, ED  I-STAT BETA HCG BLOOD, ED ([facility], [facility], [facility] ONLY)    EKG None  Radiology Ct Head Wo Contrast  Result Date: 06/01/2018 CLINICAL DATA:  Right-sided weakness EXAM: CT HEAD WITHOUT CONTRAST TECHNIQUE: Contiguous axial images were obtained from the base of the skull through the vertex without intravenous contrast. COMPARISON:  09/29/2017 FINDINGS: Brain: No evidence of acute infarction, hemorrhage, hydrocephalus, extra-axial collection or mass lesion/mass effect.  Vascular: No hyperdense vessel or unexpected calcification. Skull: No osseous abnormality. Sinuses/Orbits: Visualized paranasal sinuses are clear. Visualized mastoid sinuses are clear. Visualized orbits demonstrate no focal abnormality. Other: None IMPRESSION: No acute intracranial pathology. Electronically Signed   By: Kathreen Devoid   On: 06/01/2018 14:14    Procedures Procedures (including critical care time)  Medications Ordered in ED Medications  metoCLOPramide (REGLAN) injection 10 mg (has no administration in time range)  ketorolac (TORADOL) 15 MG/ML injection 15 mg (has no administration in time range)  lactated ringers bolus 1,000 mL (1,000 mLs Intravenous New Bag/Given 06/01/18 1507)     Initial Impression / Assessment and Plan / ED Course  I have reviewed the triage vital signs and the nursing notes.  Pertinent labs & imaging results that were available during my care of the patient were reviewed by me and considered in my medical decision making (see chart for details).  Clinical Course as of Jun 01 1621  Thu Jun 01, 2018  [place] presents with altered mental status and difficulty speaking as per above.On exam, Sydney speech is slow.  She is not dysarthric.  Sydney neurological exam is otherwise normal.  She can ambulate with mild assistance.  Sydney difficulty walking appears volitional.  Sydney reflexes are 2+.The differential for Sydney reported confusion and difficulty speaking is wide.  Sydney presentation is not consistent with an acute stroke or intracranial hemorrhage.  I do not think that she has meningitis or encephalitis.  She is afebrile and has no meningismus.  Although she takes Celexa, she does not take any antipsychotics.  Given that she is afebrile with normal reflexes, I have a very low suspicion for serotonin syndrome or NMS.  Additionally, Sydney presentation is not consistent with Guillain-Barr or  myasthenia gravis/Lambert-Eaton.  Sydney presentation is also not consistent  with a toxidrome.In triage, she had a CMP and a CBC that revealed no acute abnormalities.  She does not have an anion gap.  Sydney pregnancy test is negative.  We will obtain a UDS, UA, ammonia, acetaminophen, and salicylate level. Sydney head CT revealed no acute abnormalities. Given Sydney reassuring neurological exam and work-up so far, conversion disorder seems most likely.   She was given reglan for Sydney headache.  4:24 PM Care transferred to Dr. Glory Rosebush.  [NA]    Clinical Course User Index [NA] Alford Highland, MD    Final Clinical Impressions(s) / ED Diagnoses   Final diagnoses:  Confusion    ED Discharge Orders    None       Alford Highland, MD 06/01/18 1624    Julianne Rice, MD 06/02/18 [phone]

## 2018-06-01 NOTE — Telephone Encounter (Signed)
The pt has been advised of the information and verbalized understanding.

## 2018-06-13 ENCOUNTER — Other Ambulatory Visit: Payer: Self-pay

## 2018-06-13 ENCOUNTER — Emergency Department (HOSPITAL_COMMUNITY)
Admission: EM | Admit: 2018-06-13 | Discharge: 2018-06-14 | Disposition: A | Discharge status: Other Acute Inpatient Hospital | Payer: Medicaid Other | Attending: Emergency Medicine | Admitting: Emergency Medicine

## 2018-06-13 DIAGNOSIS — F419 Anxiety disorder, unspecified: Secondary | ICD-10-CM | POA: Insufficient documentation

## 2018-06-13 DIAGNOSIS — J45909 Unspecified asthma, uncomplicated: Secondary | ICD-10-CM | POA: Insufficient documentation

## 2018-06-13 DIAGNOSIS — F332 Major depressive disorder, recurrent severe without psychotic features: Secondary | ICD-10-CM | POA: Diagnosis present

## 2018-06-13 DIAGNOSIS — R45851 Suicidal ideations: Secondary | ICD-10-CM | POA: Insufficient documentation

## 2018-06-13 DIAGNOSIS — Z79899 Other long term (current) drug therapy: Secondary | ICD-10-CM | POA: Insufficient documentation

## 2018-06-13 DIAGNOSIS — Z23 Encounter for immunization: Secondary | ICD-10-CM | POA: Insufficient documentation

## 2018-06-13 LAB — COMPREHENSIVE METABOLIC PANEL
ALBUMIN: 3.7 g/dL (ref 3.5–5.0)
ALK PHOS: 37 U/L — AB (ref 38–126)
ALT: 11 U/L (ref 0–44)
AST: 13 U/L — AB (ref 15–41)
Anion gap: 11 (ref 5–15)
BILIRUBIN TOTAL: 0.5 mg/dL (ref 0.3–1.2)
BUN: 16 mg/dL (ref 6–20)
CALCIUM: 9.5 mg/dL (ref 8.9–10.3)
CO2: 23 mmol/L (ref 22–32)
CREATININE: 1.04 mg/dL — AB (ref 0.44–1.00)
Chloride: 105 mmol/L (ref 98–111)
GFR calc Af Amer: >60 mL/min (ref >60)
GFR calc non Af Amer: >60 mL/min (ref >60)
GLUCOSE: 97 mg/dL (ref 70–99)
Potassium: 4.4 mmol/L (ref 3.5–5.1)
Sodium: 139 mmol/L (ref 135–145)
Total Protein: 7.7 g/dL (ref 6.5–8.1)

## 2018-06-13 LAB — CBC
HEMATOCRIT: 35.5 % — AB (ref 36.0–46.0)
HEMOGLOBIN: 11.6 g/dL — AB (ref 12.0–15.0)
MCH: 28.8 pg (ref 26.0–34.0)
MCHC: 32.7 g/dL (ref 30.0–36.0)
MCV: 88.1 fL (ref 78.0–100.0)
Platelets: 441 10*3/uL — ABNORMAL HIGH (ref 150–400)
RBC: 4.03 MIL/uL (ref 3.87–5.11)
RDW: 14.7 % (ref 11.5–15.5)
WBC: 9.7 10*3/uL (ref 4.0–10.5)

## 2018-06-13 LAB — ACETAMINOPHEN LEVEL: Acetaminophen (Tylenol), Serum: <10 ug/mL — ABNORMAL LOW (ref 10–30)

## 2018-06-13 LAB — SALICYLATE LEVEL: Salicylate Lvl: <7 mg/dL (ref 2.8–30.0)

## 2018-06-13 LAB — RAPID URINE DRUG SCREEN, HOSP PERFORMED
Amphetamines: NOT DETECTED
BARBITURATES: NOT DETECTED
Benzodiazepines: NOT DETECTED
COCAINE: NOT DETECTED
Opiates: NOT DETECTED
TETRAHYDROCANNABINOL: POSITIVE — AB

## 2018-06-13 LAB — I-STAT BETA HCG BLOOD, ED (MC, WL, AP ONLY)

## 2018-06-13 LAB — ETHANOL: Alcohol, Ethyl (B): <10 mg/dL (ref <10)

## 2018-06-13 MED ORDER — RISAQUAD PO CAPS
1.0000 | ORAL_CAPSULE | Freq: Every day | ORAL | Status: DC
  Administered 2018-06-14: 1 via ORAL
  Filled 2018-06-13: qty 1

## 2018-06-13 MED ORDER — TETANUS-DIPHTH-ACELL PERTUSSIS 5-2.5-18.5 LF-MCG/0.5 IM SUSP
0.5000 mL | Freq: Once | INTRAMUSCULAR | Status: AC
  Administered 2018-06-13: 0.5 mL via INTRAMUSCULAR
  Filled 2018-06-13: qty 0.5

## 2018-06-13 MED ORDER — FAMOTIDINE 20 MG PO TABS
20.0000 mg | ORAL_TABLET | Freq: Every day | ORAL | Status: DC
  Administered 2018-06-14: 20 mg via ORAL
  Filled 2018-06-13: qty 1

## 2018-06-13 MED ORDER — ACETAMINOPHEN 325 MG PO TABS: 650.0000 mg | ORAL_TABLET | ORAL | Status: DC | PRN

## 2018-06-13 MED ORDER — MESALAMINE 400 MG PO CPDR
2400.0000 mg | DELAYED_RELEASE_CAPSULE | Freq: Every day | ORAL | Status: DC
  Administered 2018-06-14: 2400 mg via ORAL
  Filled 2018-06-13: qty 6

## 2018-06-13 MED ORDER — PREDNISONE 20 MG PO TABS
20.0000 mg | ORAL_TABLET | Freq: Every day | ORAL | Status: DC
  Administered 2018-06-14: 20 mg via ORAL
  Filled 2018-06-13: qty 1

## 2018-06-13 MED ORDER — ALBUTEROL SULFATE HFA 108 (90 BASE) MCG/ACT IN AERS: 2.0000 | INHALATION_SPRAY | RESPIRATORY_TRACT | Status: DC | PRN

## 2018-06-13 MED ORDER — CITALOPRAM HYDROBROMIDE 10 MG PO TABS
40.0000 mg | ORAL_TABLET | Freq: Every day | ORAL | Status: DC
  Administered 2018-06-14: 40 mg via ORAL
  Filled 2018-06-13: qty 4

## 2018-06-13 NOTE — ED Provider Notes (Signed)
[facility] DEPT Provider Note   CSN: [id number] Arrival date & time: 06/13/18  1245     History   Chief Complaint Chief Complaint  Patient presents with  . Suicidal  . Mental Health Problem    HPI Sydney Deleon is a 27 y.o. female.  HPI   Patient is a 27 year old female with a history of ulcerative colitis, bipolar 1 disorder, depression presenting for suicidal ideations.  Patient is living at [facility] for [facility] [place] and attempted to harm herself with linear cut to her left wrist with blunt scissors.  Patient was not forthcoming about who discovered her attempt, but that she was transferred by EMS.  When asked the patient was attempting to harm her self she nodded "yes".  Patient also reporting that she has attempted to harm her self before.  Patient denies any homicidal ideations.  When asked about "seeing or hearing things" patient nods her head "yes".  When asked if they are not voices, patient nods her head "yes".  Patient does not recall when the last time she took psychiatric medications.  When asked about multiple illicit substances, patient denies alcohol, tobacco, THC, cocaine, methamphetamines, or opioids.  Patient denying any headaches, chest pain, shortness of breath, nausea, vomiting, diarrhea, abdominal pain.   Patient only answering yes/no questions on my evaluation.  Past Medical History:  Diagnosis Date  . Acid reflux   . Allergy   . Anxiety   . Arthritis   . Asthma   . Bipolar 1 disorder ([place])   . Chronic bronchitis ([place])   . Depression   . Miscarriage within last 12 months     Patient Active Problem List   Diagnosis Date Noted  . Ulcerative colitis ([place]) 05/12/2018  . Diarrhea 04/26/2018  . Generalized abdominal pain 04/26/2018  . Nausea and vomiting 04/26/2018  . Depression   . Bipolar 1 disorder ([place])   . Asthma   . Acid reflux     Past Surgical History:  Procedure Laterality Date  .  TONSILECTOMY/ADENOIDECTOMY WITH MYRINGOTOMY       OB History    Gravida  2   Para      Term      Preterm      AB      Living        SAB      TAB      Ectopic      Multiple      Live Births               Home Medications    Prior to Admission medications   Medication Sig Start Date End Date Taking? Authorizing Provider  albuterol (PROVENTIL HFA;VENTOLIN HFA) 108 (90 Base) MCG/ACT inhaler Inhale 2 puffs into the lungs every 4 (four) hours as needed for wheezing or shortness of breath.    [provider]  citalopram (CELEXA) 40 MG tablet Take 40 mg by mouth daily.    [provider]  diphenoxylate-atropine (LOMOTIL) 2.5-0.025 MG tablet Take 2 tablets by mouth every 8 (eight) hours as needed for diarrhea or loose stools. 04/26/18   Zehr, Janett Billow D, PA-C  feeding supplement (BOOST HIGH PROTEIN) LIQD Take 1 Container by mouth daily.    [provider]  ibuprofen (ADVIL,MOTRIN) 600 MG tablet Take 1 tablet (600 mg total) by mouth every 6 (six) hours as needed. Patient taking differently: Take 600 mg by mouth every 6 (six) hours as needed (for pain).  12/18/17  Leftwich-Kirby, Kathie Dike, CNM  Mesalamine 800 MG TBEC Take 3 tablets (2,400 mg total) by mouth daily. 05/18/18   Irene Shipper, MD  Multiple Vitamin (MULTIVITAMIN WITH MINERALS) TABS tablet Take 1 tablet by mouth daily.    [provider]  naproxen (NAPROSYN) 500 MG tablet Take 1 tablet (500 mg total) by mouth 2 (two) times daily with a meal. 04/09/18   Caccavale, Sophia, PA-C  ondansetron (ZOFRAN ODT) 4 MG disintegrating tablet Take 1 tablet (4 mg total) by mouth every 8 (eight) hours as needed for nausea or vomiting. 04/26/18   Zehr, Laban Emperor, PA-C  phenazopyridine (PYRIDIUM) 200 MG tablet Take 1 tablet (200 mg total) by mouth 3 (three) times daily. 12/03/17   Larene Pickett, PA-C  predniSONE (DELTASONE) 20 MG tablet Take 1 tablet (20 mg total) by mouth daily with breakfast. 04/28/18    Irene Shipper, MD  Probiotic Product ([place]) Take by mouth daily.    [provider]  ranitidine (ZANTAC) 150 MG tablet Take 150 mg by mouth 2 (two) times daily.    [provider]    Family History Family History  Problem Relation Age of Onset  . Hypertension Mother   . Throat cancer Paternal Grandfather   . Colon cancer Cousin        2 cousins  . Breast cancer Cousin   . Rectal cancer Cousin   . Breast cancer Cousin   . Esophageal cancer Neg Hx   . Stomach cancer Neg Hx     Social History Social History   Tobacco Use  . Smoking status: Never Smoker  . Smokeless tobacco: Never Used  Substance Use Topics  . Alcohol use: No  . Drug use: No     Allergies   Patient has no known allergies.   Review of Systems Review of Systems  Constitutional: Negative for chills and fever.  HENT: Negative for congestion and rhinorrhea.   Eyes: Negative for visual disturbance.  Respiratory: Negative for cough and shortness of breath.   Cardiovascular: Negative for chest pain.  Gastrointestinal: Negative for abdominal pain, nausea and vomiting.  Skin: Positive for wound.  Neurological: Negative for weakness, numbness and headaches.  Psychiatric/Behavioral: Positive for dysphoric mood, hallucinations and suicidal ideas. Negative for agitation and behavioral problems.  All other systems reviewed and are negative.    Physical Exam Updated Vital Signs BP 110/71 (BP Location: Right Arm)   Pulse 79   Temp 98.8 F (37.1 C) (Oral)   Resp 17   Wt 104.4 kg   LMP 05/25/2018   SpO2 99%   BMI 38.30 kg/m   Physical Exam  Constitutional: She appears well-developed and well-nourished.  Patient tearful on examination.  HENT:  Head: Normocephalic and atraumatic.  Mouth/Throat: Oropharynx is clear and moist.  Eyes: Pupils are equal, round, and reactive to light. Conjunctivae and EOM are normal.  Neck: Normal range of motion. Neck supple.    Cardiovascular: Normal rate, regular rhythm, S1 normal and S2 normal.  No murmur heard. No lower extremity edema.   Pulmonary/Chest: Effort normal and breath sounds normal. She has no wheezes. She has no rales.  Abdominal: Soft. She exhibits no distension. There is no tenderness. There is no guarding.  Musculoskeletal: Normal range of motion. She exhibits no edema or deformity.  Neurological: She is alert.  Cranial nerves grossly intact. Patient moves extremities symmetrically and with good coordination. Strength 5/5 in upper and lower extremities.   Skin: Skin is  warm and dry. No rash noted. No erythema.  Approximately 4 cm linear superficial laceration to the volar surface of the left wrist.  Psychiatric: She has a normal mood and affect. Her behavior is normal. Judgment and thought content normal.  Nursing note and vitals reviewed.    ED Treatments / Results  Labs (all labs ordered are listed, but only abnormal results are displayed) Labs Reviewed  COMPREHENSIVE METABOLIC PANEL - Abnormal; Notable for the following components:      Result Value   Creatinine, Ser 1.04 (*)    AST 13 (*)    Alkaline Phosphatase 37 (*)    All other components within normal limits  ACETAMINOPHEN LEVEL - Abnormal; Notable for the following components:   Acetaminophen (Tylenol), Serum <10 (*)    All other components within normal limits  CBC - Abnormal; Notable for the following components:   Hemoglobin 11.6 (*)    HCT 35.5 (*)    Platelets 441 (*)    All other components within normal limits  RAPID URINE DRUG SCREEN, HOSP PERFORMED - Abnormal; Notable for the following components:   Tetrahydrocannabinol POSITIVE (*)    All other components within normal limits  ETHANOL  SALICYLATE LEVEL  I-STAT BETA HCG BLOOD, ED ([facility], [facility], [facility] ONLY)    EKG None  ED ECG REPORT   Date: 06/13/2018  Rate: 79  Rhythm: normal sinus rhythm  Intervals: normal  ST/T Wave abnormalities: nonspecific T wave  changes, consistent with EKG in 2018.  Conduction Disutrbances:none  Narrative Interpretation:   Old EKG Reviewed: unchanged   Radiology No results found.  Procedures Procedures (including critical care time)  Medications Ordered in ED Medications - No data to display   Initial Impression / Assessment and Plan / ED Course  I have reviewed the triage vital signs and the nursing notes.  Pertinent labs & imaging results that were available during my care of the patient were reviewed by me and considered in my medical decision making (see chart for details).    Patient is nontoxic-appearing and neurologically intact.  Patient only answering yes/no questions and appears tearful and distressed on examination, but does not appear confused.  Later on during evaluation, patient was speaking in full sentences and answering questions verbally.  Records were reviewed from patient's earlier visit on 06/01/18, CT scan reviewed, and do not feel that patient is having primary neurologic presentation today.  Patient is voluntary at this time, however DO FEEL THAT PATIENT WOULD REQUIRE IVC IF ATTEMPTING TO LEAVE due to suicide attempt today.  Patient's wound is superficial and not requiring primary closure at this time.   4:05 PM Patient is cleared to speak with TTS.  Feel that patient's presentation today is an exacerbation of patient's chronic psychiatric conditions.  Patient is in understanding and agrees with the plan of care.  Final Clinical Impressions(s) / ED Diagnoses   Final diagnoses:  Suicidal ideations    ED Discharge Orders    None       Tamala Julian 06/13/18 1632    Julianne Rice, MD 06/14/18 1328
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
## 2018-06-13 NOTE — ED Notes (Signed)
Bed: [facility] Expected date:  Expected time:  Means of arrival:  Comments: EMS-SI

## 2018-06-13 NOTE — ED Notes (Signed)
Patient to take a shower at this time accompanied by sitter.

## 2018-06-13 NOTE — ED Triage Notes (Signed)
Patient locked self in bathroom threatening to cut herself with scissors.Abrasion noted to left arm. Patient SI denies HI.  HR: 80 RR: 18 CBG: 100

## 2018-06-13 NOTE — ED Notes (Signed)
Patient denies pain and is resting comfortably.  

## 2018-06-14 ENCOUNTER — Other Ambulatory Visit: Payer: Self-pay

## 2018-06-14 ENCOUNTER — Encounter (HOSPITAL_COMMUNITY): Payer: Self-pay

## 2018-06-14 ENCOUNTER — Inpatient Hospital Stay (HOSPITAL_COMMUNITY)
Admission: AD | Admit: 2018-06-14 | Discharge: 2018-06-26 | DRG: 885 | Disposition: A | Discharge status: Home / Self Care | Payer: Federal, State, Local not specified - Other | Source: Intra-hospital | Attending: Psychiatry | Admitting: Psychiatry

## 2018-06-14 DIAGNOSIS — K219 Gastro-esophageal reflux disease without esophagitis: Secondary | ICD-10-CM | POA: Diagnosis present

## 2018-06-14 DIAGNOSIS — Z915 Personal history of self-harm: Secondary | ICD-10-CM | POA: Diagnosis not present

## 2018-06-14 DIAGNOSIS — Z808 Family history of malignant neoplasm of other organs or systems: Secondary | ICD-10-CM | POA: Diagnosis not present

## 2018-06-14 DIAGNOSIS — Z800 Family history of malignant neoplasm of digestive organs: Secondary | ICD-10-CM

## 2018-06-14 DIAGNOSIS — F332 Major depressive disorder, recurrent severe without psychotic features: Secondary | ICD-10-CM | POA: Diagnosis present

## 2018-06-14 DIAGNOSIS — F410 Panic disorder [episodic paroxysmal anxiety] without agoraphobia: Secondary | ICD-10-CM | POA: Diagnosis present

## 2018-06-14 DIAGNOSIS — G4700 Insomnia, unspecified: Secondary | ICD-10-CM | POA: Diagnosis present

## 2018-06-14 DIAGNOSIS — J45909 Unspecified asthma, uncomplicated: Secondary | ICD-10-CM | POA: Diagnosis present

## 2018-06-14 DIAGNOSIS — Z818 Family history of other mental and behavioral disorders: Secondary | ICD-10-CM

## 2018-06-14 DIAGNOSIS — Z62810 Personal history of physical and sexual abuse in childhood: Secondary | ICD-10-CM | POA: Diagnosis present

## 2018-06-14 DIAGNOSIS — F4310 Post-traumatic stress disorder, unspecified: Secondary | ICD-10-CM | POA: Diagnosis present

## 2018-06-14 DIAGNOSIS — F17210 Nicotine dependence, cigarettes, uncomplicated: Secondary | ICD-10-CM | POA: Diagnosis present

## 2018-06-14 DIAGNOSIS — Z8249 Family history of ischemic heart disease and other diseases of the circulatory system: Secondary | ICD-10-CM

## 2018-06-14 DIAGNOSIS — K5190 Ulcerative colitis, unspecified, without complications: Secondary | ICD-10-CM | POA: Diagnosis present

## 2018-06-14 DIAGNOSIS — Z590 Homelessness: Secondary | ICD-10-CM

## 2018-06-14 DIAGNOSIS — R45851 Suicidal ideations: Secondary | ICD-10-CM | POA: Diagnosis present

## 2018-06-14 DIAGNOSIS — F322 Major depressive disorder, single episode, severe without psychotic features: Secondary | ICD-10-CM | POA: Diagnosis not present

## 2018-06-14 DIAGNOSIS — F419 Anxiety disorder, unspecified: Secondary | ICD-10-CM | POA: Diagnosis not present

## 2018-06-14 DIAGNOSIS — Z803 Family history of malignant neoplasm of breast: Secondary | ICD-10-CM | POA: Diagnosis not present

## 2018-06-14 MED ORDER — MAGNESIUM HYDROXIDE 400 MG/5ML PO SUSP
30.0000 mL | Freq: Every day | ORAL | Status: DC | PRN
  Administered 2018-06-23 – 2018-06-25 (×2): 30 mL via ORAL
  Filled 2018-06-14 (×2): qty 30

## 2018-06-14 MED ORDER — FAMOTIDINE 20 MG PO TABS
20.0000 mg | ORAL_TABLET | Freq: Every day | ORAL | Status: DC
  Administered 2018-06-14 – 2018-06-26 (×13): 20 mg via ORAL
  Filled 2018-06-14 (×15): qty 1

## 2018-06-14 MED ORDER — ALBUTEROL SULFATE HFA 108 (90 BASE) MCG/ACT IN AERS: 2.0000 | INHALATION_SPRAY | RESPIRATORY_TRACT | Status: DC | PRN

## 2018-06-14 MED ORDER — ACETAMINOPHEN 325 MG PO TABS
650.0000 mg | ORAL_TABLET | Freq: Four times a day (QID) | ORAL | Status: DC | PRN
  Administered 2018-06-15: 650 mg via ORAL
  Filled 2018-06-14: qty 2

## 2018-06-14 MED ORDER — HYDROXYZINE HCL 25 MG PO TABS
25.0000 mg | ORAL_TABLET | Freq: Three times a day (TID) | ORAL | Status: DC | PRN
  Administered 2018-06-14 – 2018-06-19 (×6): 25 mg via ORAL
  Filled 2018-06-14: qty 1
  Filled 2018-06-14: qty 10
  Filled 2018-06-14 (×5): qty 1

## 2018-06-14 MED ORDER — ALUM & MAG HYDROXIDE-SIMETH 200-200-20 MG/5ML PO SUSP
30.0000 mL | ORAL | Status: DC | PRN
  Administered 2018-06-15 – 2018-06-17 (×2): 30 mL via ORAL
  Filled 2018-06-14 (×2): qty 30

## 2018-06-14 MED ORDER — CITALOPRAM HYDROBROMIDE 40 MG PO TABS
40.0000 mg | ORAL_TABLET | Freq: Every day | ORAL | Status: DC
  Administered 2018-06-15 – 2018-06-26 (×12): 40 mg via ORAL
  Filled 2018-06-14 (×7): qty 1
  Filled 2018-06-14: qty 7
  Filled 2018-06-14 (×6): qty 1

## 2018-06-14 MED ORDER — PREDNISONE 20 MG PO TABS
20.0000 mg | ORAL_TABLET | Freq: Every day | ORAL | Status: DC
  Administered 2018-06-15 – 2018-06-17 (×3): 20 mg via ORAL
  Filled 2018-06-14 (×6): qty 1

## 2018-06-14 MED ORDER — TRAZODONE HCL 50 MG PO TABS
50.0000 mg | ORAL_TABLET | Freq: Every evening | ORAL | Status: DC | PRN
  Administered 2018-06-14 – 2018-06-25 (×6): 50 mg via ORAL
  Filled 2018-06-14 (×2): qty 1
  Filled 2018-06-14: qty 7
  Filled 2018-06-14 (×6): qty 1

## 2018-06-14 MED ORDER — RISAQUAD PO CAPS
1.0000 | ORAL_CAPSULE | Freq: Every day | ORAL | Status: DC
  Administered 2018-06-15 – 2018-06-26 (×12): 1 via ORAL
  Filled 2018-06-14 (×14): qty 1

## 2018-06-14 MED ORDER — MESALAMINE 400 MG PO CPDR
2400.0000 mg | DELAYED_RELEASE_CAPSULE | Freq: Every day | ORAL | Status: DC
  Filled 2018-06-14 (×2): qty 6
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
## 2018-06-14 NOTE — ED Notes (Signed)
[facility] transport on unit to transfer pt to [facility] per MD order. Personal property given to transport for transfer. Pt signed e-signature. Ambulatory off unit.

## 2018-06-14 NOTE — Progress Notes (Signed)
Skin assessment completed. Search process completed. V/s assessed. Required documents reviewed with pt and signed. No locker need on admission. Pt safely brought onto the unit. Report given to the next shift to complete pt's admission process.

## 2018-06-14 NOTE — Progress Notes (Signed)
Adult Psychoeducational Group Note  Date:  06/14/2018 Time:  9:41 PM  Group Topic/Focus:  Wrap-Up Group:   The focus of this group is to help patients review their daily goal of treatment and discuss progress on daily workbooks.  Participation Level:  Active  Participation Quality:  Appropriate  Affect:  Flat and Tearful  Cognitive:  Alert and Oriented  Insight: Appropriate  Engagement in Group:  Developing/Improving  Modes of Intervention:  Exploration and Support  Additional Comments:  Pt verbalized that her day started at a 1 but after she took a shower it became a 5. Pt verbalized that she has been crying all day.   Ladarius Seubert, Patrick North 06/14/2018, 9:41 PM

## 2018-06-14 NOTE — ED Notes (Signed)
Pt talking on hallway phone.

## 2018-06-14 NOTE — BH Assessment (Signed)
[facility] Assessment Progress Note  Per Buford Dresser, DO, this pt requires psychiatric hospitalization at this time.  Brook McNichols, RN, [facility] has assigned pt to [facility] Rm [id number].  Pt has signed Voluntary Admission and Consent for Treatment, as well as Consent to Release Information to pt's mother, and signed forms have been faxed to [facility].  Pt's nurse, Caryl Pina, has been notified, and agrees to send original paperwork along with pt via [facility], and to call report to [phone].  Jalene Mullet, Star Coordinator [phone]
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
## 2018-06-14 NOTE — Tx Team (Signed)
Initial Treatment Plan 06/14/2018 8:08 PM FADUMA CHO UOO:[id number]    PATIENT STRESSORS: Financial difficulties Marital or family conflict Medication change or noncompliance Occupational concerns   PATIENT STRENGTHS: General fund of knowledge Motivation for treatment/growth   PATIENT IDENTIFIED PROBLEMS: "help with a place to live"  "dont want to feel so depressed"                   DISCHARGE CRITERIA:  Adequate post-discharge living arrangements Improved stabilization in mood, thinking, and/or behavior Reduction of life-threatening or endangering symptoms to within safe limits Verbal commitment to aftercare and medication compliance  PRELIMINARY DISCHARGE PLAN: Attend aftercare/continuing care group Placement in alternative living arrangements  PATIENT/FAMILY INVOLVEMENT: This treatment plan has been presented to and reviewed with the patient, MADLYNN LUNDEEN, and/or family member, .  The patient and family have been given the opportunity to ask questions and make suggestions.  Migdalia Dk, RN 06/14/2018, 8:08 PM

## 2018-06-15 MED ORDER — OXCARBAZEPINE 150 MG PO TABS
75.0000 mg | ORAL_TABLET | Freq: Every day | ORAL | Status: DC
  Administered 2018-06-15 – 2018-06-16 (×2): 75 mg via ORAL
  Filled 2018-06-15 (×2): qty 0.5
  Filled 2018-06-15: qty 1
  Filled 2018-06-15: qty 0.5

## 2018-06-15 MED ORDER — OXCARBAZEPINE 150 MG PO TABS
75.0000 mg | ORAL_TABLET | Freq: Two times a day (BID) | ORAL | Status: DC
  Administered 2018-06-15: 75 mg via ORAL
  Filled 2018-06-15 (×5): qty 0.5
  Filled 2018-06-15: qty 1

## 2018-06-15 MED ORDER — LORAZEPAM 0.5 MG PO TABS
0.5000 mg | ORAL_TABLET | Freq: Once | ORAL | Status: AC
  Administered 2018-06-15: 0.5 mg via ORAL
  Filled 2018-06-15: qty 1

## 2018-06-15 MED ORDER — MESALAMINE 1.2 G PO TBEC
2400.0000 mg | DELAYED_RELEASE_TABLET | Freq: Every day | ORAL | Status: DC
  Administered 2018-06-15 – 2018-06-26 (×12): 2.4 g via ORAL
  Filled 2018-06-15 (×14): qty 2

## 2018-06-15 NOTE — [facility] Suicide Risk Assessment (Signed)
[place] INPATIENT:  Family/Significant Other Suicide Prevention Education  Suicide Prevention Education:  Contact Attempts: Sydney Deleon, mother [phone]) has been identified by the patient as the family member/significant other with whom the patient will be residing, and identified as the person(s) who will aid the patient in the event of a mental health crisis.  With written consent from the patient, two attempts were made to provide suicide prevention education, prior to and/or following the patient's discharge.  We were unsuccessful in providing suicide prevention education.  A suicide education pamphlet was given to the patient to share with family/significant other.  Date and time of first attempt:06/15/2018 / 3:10pm   [name] 06/15/2018, 3:10 PM

## 2018-06-15 NOTE — [facility] Group Notes (Signed)
[facility] LCSW Group Therapy Note  Date/Time: 06/15/18, 1315  Type of Therapy/Topic:  Group Therapy:  Balance in Life  Participation Level:  active  Description of Group:    This group will address the concept of balance and how it feels and looks when one is unbalanced. Patients will be encouraged to process areas in their lives that are out of balance, and identify reasons for remaining unbalanced. Facilitators will guide patients utilizing problem- solving interventions to address and correct the stressor making their life unbalanced. Understanding and applying boundaries will be explored and addressed for obtaining  and maintaining a balanced life. Patients will be encouraged to explore ways to assertively make their unbalanced needs known to significant others in their lives, using other group members and facilitator for support and feedback.  Therapeutic Goals: 1. Patient will identify two or more emotions or situations they have that consume much of in their lives. 2. Patient will identify signs/triggers that life has become out of balance:  3. Patient will identify two ways to set boundaries in order to achieve balance in their lives:  4. Patient will demonstrate ability to communicate their needs through discussion and/or role plays  Summary of Patient Progress:Pt shared that physical, mental/emotional, and financial are areas that are currently out of balance.  Pt active during group discussion regarding ways to set boundaries and restore balance in these areas.  Pt shared about being in a housing program and dealing with other residents. Good participation.            Therapeutic Modalities:   Cognitive Behavioral Therapy Solution-Focused Therapy Assertiveness Training  Lurline Idol, [place]
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
## 2018-06-15 NOTE — Progress Notes (Signed)
Pt was observed in the dayroom, seen coloring. Pt attended wrap-up group this evening. Pt appears animated/anxious/silly in affect and mood. Pt denies SI/HI/VH at this time. Pt endorses AH stating "I see things from time"; but does not clarify. Pt rates pain 5/10; lower bilateral legs. Pt is childlike in mannerism with limited insight. Pt needs frequent reinforcement and encouragement from staff. PRN tylenol and Maalox requsted and given. Heat packs given for comfort.Will continue with POC.
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
## 2018-06-15 NOTE — Progress Notes (Signed)
Pt presents with a sad affect and depressed mood. Pt noted to be tearful on approach. Pt expressed feeling depressed this morning due to everything that's going on in her life. Pt had a difficult maintaining control during the assessment. Pt was given a one time order of Ativan per MD. Pt verbalized that she would feel better if she could write her feelings down in a journal. Pt rated on her self inventory depression 10, anxiety 10 and hopelessness 10. Pt denies SI/HI. Pt stated goal, "working through my thoughts". Pt compliant with taking meds and denies any side effects.  Orders reviewed with pt. Verbal support provided. Pt encouraged to attend groups. 15 minute checks performed for safety.  Pt compliant with tx plan.

## 2018-06-16 MED ORDER — CHLORPROMAZINE HCL 10 MG PO TABS
10.0000 mg | ORAL_TABLET | Freq: Three times a day (TID) | ORAL | Status: DC
  Administered 2018-06-16 (×2): 10 mg via ORAL
  Filled 2018-06-16 (×6): qty 1

## 2018-06-16 NOTE — [facility] Group Notes (Signed)
  [facility] LCSW Group Therapy Note  Date/Time: 06/16/18, 1315  Type of Therapy/Topic:  Group Therapy:  Emotion Regulation  Participation Level:  Active   Mood:pleasant  Description of Group:    The purpose of this group is to assist patients in learning to regulate negative emotions and experience positive emotions. Patients will be guided to discuss ways in which they have been vulnerable to their negative emotions. These vulnerabilities will be juxtaposed with experiences of positive emotions or situations, and patients challenged to use positive emotions to combat negative ones. Special emphasis will be placed on coping with negative emotions in conflict situations, and patients will process healthy conflict resolution skills.  Therapeutic Goals: 1. Patient will identify two positive emotions or experiences to reflect on in order to balance out negative emotions:  2. Patient will label two or more emotions that they find the most difficult to experience:  3. Patient will be able to demonstrate positive conflict resolution skills through discussion or role plays:   Summary of Patient Progress:Pt shared that anger, depression, andanxiety are the emotions that are difficult to experience.  Pt was active during group discussion regarding positive ways to manage difficult emotions.        Therapeutic Modalities:   Cognitive Behavioral Therapy Feelings Identification Dialectical Behavioral Therapy  Lurline Idol, LCSW

## 2018-06-16 NOTE — Tx Team (Signed)
Interdisciplinary Treatment and Diagnostic Plan Update  06/16/2018 Time of Session: 0929 Sydney Deleon MRN: [id number]  Principal Diagnosis: <principal problem not specified>  Secondary Diagnoses: Active Problems:   MDD (major depressive disorder), severe (HCC)   Current Medications:  Current Facility-Administered Medications  Medication Dose Route Frequency Provider Last Rate Last Dose  . acetaminophen (TYLENOL) tablet 650 mg  650 mg Oral Q6H PRN Money, Lowry Ram, FNP   650 mg at 06/15/18 2127  . acidophilus (RISAQUAD) capsule 1 capsule  1 capsule Oral Daily Money, Lowry Ram, FNP   1 capsule at 06/16/18 2979  . albuterol (PROVENTIL HFA;VENTOLIN HFA) 108 (90 Base) MCG/ACT inhaler 2 puff  2 puff Inhalation Q4H PRN Money, Lowry Ram, FNP      . alum & mag hydroxide-simeth (MAALOX/MYLANTA) 200-200-20 MG/5ML suspension 30 mL  30 mL Oral Q4H PRN Money, Lowry Ram, FNP   30 mL at 06/15/18 2127  . chlorproMAZINE (THORAZINE) tablet 10 mg  10 mg Oral TID Sharma Covert, MD      . citalopram (CELEXA) tablet 40 mg  40 mg Oral Daily Money, Lowry Ram, FNP   40 mg at 06/16/18 8921  . famotidine (PEPCID) tablet 20 mg  20 mg Oral Daily Money, Lowry Ram, FNP   20 mg at 06/16/18 1941  . hydrOXYzine (ATARAX/VISTARIL) tablet 25 mg  25 mg Oral TID PRN Money, Lowry Ram, FNP   25 mg at 06/14/18 2151  . magnesium hydroxide (MILK OF MAGNESIA) suspension 30 mL  30 mL Oral Daily PRN Money, Lowry Ram, FNP      . mesalamine (LIALDA) EC tablet 2.4 g  2,400 mg Oral Daily Minda Ditto, RPH   2.4 g at 06/16/18 0826  . OXcarbazepine (TRILEPTAL) tablet 75 mg  75 mg Oral QHS Sharma Covert, MD   75 mg at 06/15/18 2127  . predniSONE (DELTASONE) tablet 20 mg  20 mg Oral Q breakfast Money, Lowry Ram, FNP   20 mg at 06/16/18 7408  . traZODone (DESYREL) tablet 50 mg  50 mg Oral QHS PRN Money, Lowry Ram, FNP   50 mg at 06/14/18 2151   PTA Medications: Medications Prior to Admission  Medication Sig Dispense Refill Last Dose   . albuterol (PROVENTIL HFA;VENTOLIN HFA) 108 (90 Base) MCG/ACT inhaler Inhale 2 puffs into the lungs every 4 (four) hours as needed for wheezing or shortness of breath.   Not Taking at Unknown time  . citalopram (CELEXA) 40 MG tablet Take 40 mg by mouth daily.   06/12/2018 at Unknown time  . diphenoxylate-atropine (LOMOTIL) 2.5-0.025 MG tablet Take 2 tablets by mouth every 8 (eight) hours as needed for diarrhea or loose stools. 30 tablet 1 06/13/2018 at Unknown time  . feeding supplement (BOOST HIGH PROTEIN) LIQD Take 1 Container by mouth daily.   Past Week at Unknown time  . ibuprofen (ADVIL,MOTRIN) 600 MG tablet Take 1 tablet (600 mg total) by mouth every 6 (six) hours as needed. (Patient not taking: Reported on 06/13/2018) 30 tablet 1 Not Taking at Unknown time  . Mesalamine 800 MG TBEC Take 3 tablets (2,400 mg total) by mouth daily. 180 tablet 3   . Multiple Vitamin (MULTIVITAMIN WITH MINERALS) TABS tablet Take 1 tablet by mouth daily.   Not Taking at Unknown time  . naproxen (NAPROSYN) 500 MG tablet Take 1 tablet (500 mg total) by mouth 2 (two) times daily with a meal. 30 tablet 0 06/13/2018 at Unknown time  . ondansetron (ZOFRAN ODT) 4  MG disintegrating tablet Take 1 tablet (4 mg total) by mouth every 8 (eight) hours as needed for nausea or vomiting. 30 tablet 0 06/13/2018 at Unknown time  . phenazopyridine (PYRIDIUM) 200 MG tablet Take 1 tablet (200 mg total) by mouth 3 (three) times daily. (Patient not taking: Reported on 06/13/2018) 9 tablet 0 Not Taking at Unknown time  . predniSONE (DELTASONE) 20 MG tablet Take 1 tablet (20 mg total) by mouth daily with breakfast. (Patient not taking: Reported on 06/13/2018) 30 tablet 3 Not Taking at Unknown time  . Probiotic Product ([place]) Take by mouth daily.   06/13/2018 at Unknown time  . raNITIdine HCl (ZANTAC PO) Take 75 mg by mouth 2 (two) times daily.    06/13/2018 at Unknown time    Patient Stressors: Financial difficulties Marital or  family conflict Medication change or noncompliance Occupational concerns  Patient Strengths: Technical sales engineer for treatment/growth  Treatment Modalities: Medication Management, Group therapy, Case management,  1 to 1 session with clinician, Psychoeducation, Recreational therapy.   Physician Treatment Plan for Primary Diagnosis: <principal problem not specified> Long Term Goal(s): Improvement in symptoms so as ready for discharge Improvement in symptoms so as ready for discharge   Short Term Goals: Ability to identify changes in lifestyle to reduce recurrence of condition will improve Ability to verbalize feelings will improve Ability to disclose and discuss suicidal ideas Ability to demonstrate self-control will improve Ability to identify and develop effective coping behaviors will improve Ability to maintain clinical measurements within normal limits will improve Compliance with prescribed medications will improve Ability to identify changes in lifestyle to reduce recurrence of condition will improve Ability to verbalize feelings will improve Ability to disclose and discuss suicidal ideas Ability to demonstrate self-control will improve Ability to identify and develop effective coping behaviors will improve Ability to maintain clinical measurements within normal limits will improve Compliance with prescribed medications will improve  Medication Management: Evaluate patient's response, side effects, and tolerance of medication regimen.  Therapeutic Interventions: 1 to 1 sessions, Unit Group sessions and Medication administration.  Evaluation of Outcomes: Progressing  Physician Treatment Plan for Secondary Diagnosis: Active Problems:   MDD (major depressive disorder), severe ([place])  Long Term Goal(s): Improvement in symptoms so as ready for discharge Improvement in symptoms so as ready for discharge   Short Term Goals: Ability to identify changes in  lifestyle to reduce recurrence of condition will improve Ability to verbalize feelings will improve Ability to disclose and discuss suicidal ideas Ability to demonstrate self-control will improve Ability to identify and develop effective coping behaviors will improve Ability to maintain clinical measurements within normal limits will improve Compliance with prescribed medications will improve Ability to identify changes in lifestyle to reduce recurrence of condition will improve Ability to verbalize feelings will improve Ability to disclose and discuss suicidal ideas Ability to demonstrate self-control will improve Ability to identify and develop effective coping behaviors will improve Ability to maintain clinical measurements within normal limits will improve Compliance with prescribed medications will improve     Medication Management: Evaluate patient's response, side effects, and tolerance of medication regimen.  Therapeutic Interventions: 1 to 1 sessions, Unit Group sessions and Medication administration.  Evaluation of Outcomes: Progressing   RN Treatment Plan for Primary Diagnosis: <principal problem not specified> Long Term Goal(s): Knowledge of disease and therapeutic regimen to maintain health will improve  Short Term Goals: Ability to identify and develop effective coping behaviors will improve and Compliance with prescribed  medications will improve  Medication Management: RN will administer medications as ordered by provider, will assess and evaluate patient's response and provide education to patient for prescribed medication. RN will report any adverse and/or side effects to prescribing provider.  Therapeutic Interventions: 1 on 1 counseling sessions, Psychoeducation, Medication administration, Evaluate responses to treatment, Monitor vital signs and CBGs as ordered, Perform/monitor CIWA, COWS, AIMS and Fall Risk screenings as ordered, Perform wound care treatments as  ordered.  Evaluation of Outcomes: Progressing   LCSW Treatment Plan for Primary Diagnosis: <principal problem not specified> Long Term Goal(s): Safe transition to appropriate next level of care at discharge, Engage patient in therapeutic group addressing interpersonal concerns.  Short Term Goals: Engage patient in aftercare planning with referrals and resources, Increase social support and Increase skills for wellness and recovery  Therapeutic Interventions: Assess for all discharge needs, 1 to 1 time with Social worker, Explore available resources and support systems, Assess for adequacy in community support network, Educate family and significant other(s) on suicide prevention, Complete Psychosocial Assessment, Interpersonal group therapy.  Evaluation of Outcomes: Progressing   Progress in Treatment: Attending groups: Yes. Participating in groups: Yes. Taking medication as prescribed: Yes. Toleration medication: Yes. Family/Significant other contact made: No, will contact:  mother Patient understands diagnosis: Yes. Discussing patient identified problems/goals with staff: Yes. Medical problems stabilized or resolved: Yes. Denies suicidal/homicidal ideation: Yes. Issues/concerns per patient self-inventory: No. Other: none  New problem(s) identified: No, Describe:  none  New Short Term/Long Term Goal(s):  Patient Goals:  "Get on the right meds and get set up with a therapist and psychiatrist."  Discharge Plan or Barriers:   Reason for Continuation of Hospitalization: Depression Medication stabilization  Estimated Length of Stay: 2-4 days.  Attendees: Patient: Sydney Deleon 06/16/2018   Physician: Dr Mallie Darting, MD 06/16/2018   Nursing: Graceann Congress, RN 06/16/2018   RN Care Manager: 06/16/2018   Social Worker: Lurline Idol, LCSW 06/16/2018   Recreational Therapist:  06/16/2018   Other:  06/16/2018   Other:  06/16/2018   Other: 06/16/2018     Scribe for Treatment  Team: Joanne Chars, LCSW 06/16/2018 11:21 AM

## 2018-06-16 NOTE — Progress Notes (Addendum)
D Pt is observed [facility] on the 400 hall today. She has a childlike , bizarre affect. She demonstrates attention-seeking behaviors as evidenced by her repeatedly asking this nurse the same question, over and over. She can demonstrate coy, shy behaviors and then become non responsive and evasive.    A She completed her daily assessment and on this she wrote she deneid SI today and she rated her depression, hopelessness and anxeity " 5-6/6/6", respectively.  She is attending her groups, she is somatically focused and she is anxious.    R Safety is in place .Pt receiving thorazine 1st dose today.

## 2018-06-16 NOTE — Plan of Care (Signed)
  Problem: Education: Goal: Knowledge of [facility] Education information/materials will improve Outcome: Progressing Goal: Emotional status will improve Outcome: Progressing Goal: Mental status will improve Outcome: Progressing Goal: Verbalization of understanding the information provided will improve Outcome: Progressing   Problem: Activity: Goal: Interest or engagement in activities will improve Outcome: Progressing Goal: Sleeping patterns will improve Outcome: Progressing   Problem: Coping: Goal: Ability to verbalize frustrations and anger appropriately will improve Outcome: Progressing Goal: Ability to demonstrate self-control will improve Outcome: Progressing   Problem: Health Behavior/Discharge Planning: Goal: Identification of resources available to assist in meeting health care needs will improve Outcome: Progressing Goal: Compliance with treatment plan for underlying cause of condition will improve Outcome: Progressing   Problem: Physical Regulation: Goal: Ability to maintain clinical measurements within normal limits will improve Outcome: Progressing   Problem: Safety: Goal: Periods of time without injury will increase Outcome: Progressing   Problem: Education: Goal: Utilization of techniques to improve thought processes will improve Outcome: Progressing Goal: Knowledge of the prescribed therapeutic regimen will improve Outcome: Progressing   Problem: Activity: Goal: Interest or engagement in leisure activities will improve Outcome: Progressing Goal: Imbalance in normal sleep/wake cycle will improve Outcome: Progressing   Problem: Coping: Goal: Coping ability will improve Outcome: Progressing Goal: Will verbalize feelings Outcome: Progressing   Problem: Health Behavior/Discharge Planning: Goal: Ability to make decisions will improve Outcome: Progressing Goal: Compliance with therapeutic regimen will improve Outcome: Progressing    Problem: Role Relationship: Goal: Will demonstrate positive changes in social behaviors and relationships Outcome: Progressing   Problem: Safety: Goal: Ability to disclose and discuss suicidal ideas will improve Outcome: Progressing Goal: Ability to identify and utilize support systems that promote safety will improve Outcome: Progressing   Problem: Self-Concept: Goal: Will verbalize positive feelings about self Outcome: Progressing Goal: Level of anxiety will decrease Outcome: Progressing   Problem: Education: Goal: Ability to make informed decisions regarding treatment will improve Outcome: Progressing   Problem: Coping: Goal: Coping ability will improve Outcome: Progressing   Problem: Health Behavior/Discharge Planning: Goal: Identification of resources available to assist in meeting health care needs will improve Outcome: Progressing   Problem: Medication: Goal: Compliance with prescribed medication regimen will improve Outcome: Progressing   Problem: Self-Concept: Goal: Ability to disclose and discuss suicidal ideas will improve Outcome: Progressing Goal: Will verbalize positive feelings about self Outcome: Progressing

## 2018-06-16 NOTE — Plan of Care (Signed)
  Problem: Education: Goal: Emotional status will improve Outcome: Not Progressing

## 2018-06-16 NOTE — Progress Notes (Signed)
[facility] MD Progress Note  06/16/2018 11:18 AM Sydney Deleon  MRN:  [id number] Subjective: Patient is seen and examined.  Patient is a 26 year old female with a reported history of major depression, possible bipolar disorder, posttraumatic stress disorder who was admitted on 8/28 with suicidal ideation.  She states she feels little bit better this morning.  Yesterday afternoon the Trileptal led to some dizziness, so the daytime dosage was stopped.  She stated she slept better with that and felt more at ease with the nighttime dose.  She admitted to auditory hallucinations.  She stated the auditory hallucinations were in her head.  She stated the voices were telling her to harm herself.  She has been treated with multiple antipsychotic agents in the past, and was never able to tolerate any of them.  She has not been treated with Thorazine.  We discussed that.  Suicidal ideation is mentioned, but otherwise she is essentially unchanged. Principal Problem: <principal problem not specified> Diagnosis:   Patient Active Problem List   Diagnosis Date Noted  . MDD (major depressive disorder), severe ([place]) [F32.2] 06/14/2018  . MDD (major depressive disorder), recurrent severe, without psychosis ([place]) [F33.2]   . Ulcerative colitis ([place]) [K51.90] 05/12/2018  . Diarrhea [R19.7] 04/26/2018  . Generalized abdominal pain [R10.84] 04/26/2018  . Nausea and vomiting [R11.2] 04/26/2018  . Depression [F32.9]   . Bipolar 1 disorder ([place]) [F31.9]   . Asthma [J45.909]   . Acid reflux [K21.9]    Total Time spent with patient: 20 minutes  Past Psychiatric History: See admission H&P  Past Medical History:  Past Medical History:  Diagnosis Date  . Acid reflux   . Allergy   . Anxiety   . Arthritis   . Asthma   . Bipolar 1 disorder ([place])   . Chronic bronchitis ([place])   . Depression   . Miscarriage within last 12 months     Past Surgical History:  Procedure Laterality Date  . TONSILECTOMY/ADENOIDECTOMY WITH  MYRINGOTOMY     Family History:  Family History  Problem Relation Age of Onset  . Hypertension Mother   . Throat cancer Paternal Grandfather   . Colon cancer Cousin        2 cousins  . Breast cancer Cousin   . Rectal cancer Cousin   . Breast cancer Cousin   . Esophageal cancer Neg Hx   . Stomach cancer Neg Hx    Family Psychiatric  History: See admission H&P Social History:  Social History   Substance and Sexual Activity  Alcohol Use No     Social History   Substance and Sexual Activity  Drug Use No    Social History   Socioeconomic History  . Marital status: Single    Spouse name: Not on file  . Number of children: Not on file  . Years of education: Not on file  . Highest education level: Not on file  Occupational History  . Not on file  Social Needs  . Financial resource strain: Not on file  . Food insecurity:    Worry: Not on file    Inability: Not on file  . Transportation needs:    Medical: Not on file    Non-medical: Not on file  Tobacco Use  . Smoking status: Never Smoker  . Smokeless tobacco: Never Used  Substance and Sexual Activity  . Alcohol use: No  . Drug use: No  . Sexual activity: Yes    Birth control/protection: None    Comment:  on period now  Lifestyle  . Physical activity:    Days per week: Not on file    Minutes per session: Not on file  . Stress: Not on file  Relationships  . Social connections:    Talks on phone: Not on file    Gets together: Not on file    Attends religious service: Not on file    Active member of club or organization: Not on file    Attends meetings of clubs or organizations: Not on file    Relationship status: Not on file  Other Topics Concern  . Not on file  Social History Narrative  . Not on file   Additional Social History:    Pain Medications: See MAR Prescriptions: See MAR Over the Counter: See MAR History of alcohol / drug use?: Yes Longest period of sobriety (when/how long): unknown Name of  Substance 1: mariujuana 1 - Last Use / Amount: pt denies ever using.  She stated she has been around people who use marijuana.                  Sleep: Fair  Appetite:  Fair  Current Medications: Current Facility-Administered Medications  Medication Dose Route Frequency Provider Last Rate Last Dose  . acetaminophen (TYLENOL) tablet 650 mg  650 mg Oral Q6H PRN Money, Lowry Ram, FNP   650 mg at 06/15/18 2127  . acidophilus (RISAQUAD) capsule 1 capsule  1 capsule Oral Daily Money, Lowry Ram, FNP   1 capsule at 06/16/18 2355  . albuterol (PROVENTIL HFA;VENTOLIN HFA) 108 (90 Base) MCG/ACT inhaler 2 puff  2 puff Inhalation Q4H PRN Money, Lowry Ram, FNP      . alum & mag hydroxide-simeth (MAALOX/MYLANTA) 200-200-20 MG/5ML suspension 30 mL  30 mL Oral Q4H PRN Money, Lowry Ram, FNP   30 mL at 06/15/18 2127  . chlorproMAZINE (THORAZINE) tablet 10 mg  10 mg Oral TID Sharma Covert, MD      . citalopram (CELEXA) tablet 40 mg  40 mg Oral Daily Money, Lowry Ram, FNP   40 mg at 06/16/18 7322  . famotidine (PEPCID) tablet 20 mg  20 mg Oral Daily Money, Lowry Ram, FNP   20 mg at 06/16/18 0254  . hydrOXYzine (ATARAX/VISTARIL) tablet 25 mg  25 mg Oral TID PRN Money, Lowry Ram, FNP   25 mg at 06/14/18 2151  . magnesium hydroxide (MILK OF MAGNESIA) suspension 30 mL  30 mL Oral Daily PRN Money, Lowry Ram, FNP      . mesalamine (LIALDA) EC tablet 2.4 g  2,400 mg Oral Daily Minda Ditto, RPH   2.4 g at 06/16/18 0826  . OXcarbazepine (TRILEPTAL) tablet 75 mg  75 mg Oral QHS Sharma Covert, MD   75 mg at 06/15/18 2127  . predniSONE (DELTASONE) tablet 20 mg  20 mg Oral Q breakfast Money, Lowry Ram, FNP   20 mg at 06/16/18 2706  . traZODone (DESYREL) tablet 50 mg  50 mg Oral QHS PRN Money, Lowry Ram, FNP   50 mg at 06/14/18 2151    Lab Results: No results found for this or any previous visit (from the past 48 hour(s)).  Blood Alcohol level:  Lab Results  Component Value Date   ETH <10 23/76/2831     Metabolic Disorder Labs: No results found for: HGBA1C, MPG No results found for: PROLACTIN No results found for: CHOL, TRIG, HDL, CHOLHDL, VLDL, LDLCALC  Physical Findings: AIMS: Facial and Oral Movements Muscles of  Facial Expression: None, normal Lips and Perioral Area: None, normal Jaw: None, normal Tongue: None, normal,Extremity Movements Upper (arms, wrists, hands, fingers): None, normal Lower (legs, knees, ankles, toes): None, normal, Trunk Movements Neck, shoulders, hips: None, normal, Overall Severity Severity of abnormal movements (highest score from questions above): None, normal Incapacitation due to abnormal movements: None, normal Patient's awareness of abnormal movements (rate only patient's report): No Awareness, Dental Status Current problems with teeth and/or dentures?: No Does patient usually wear dentures?: No  CIWA:    COWS:     Musculoskeletal: Strength & Muscle Tone: within normal limits Gait & Station: normal Patient leans: N/A  Psychiatric Specialty Exam: Physical Exam  Nursing note and vitals reviewed. Constitutional: She is oriented to person, place, and time. She appears well-developed and well-nourished.  HENT:  Head: Normocephalic and atraumatic.  Respiratory: Effort normal.  Neurological: She is alert and oriented to person, place, and time.    ROS  Blood pressure 113/78, pulse 90, temperature 98.5 F (36.9 C), temperature source Oral, resp. rate 17, height 5' 5"  (1.651 m), weight 103.4 kg, last menstrual period 05/25/2018.Body mass index is 37.94 kg/m.  General Appearance: Casual  Eye Contact:  Fair  Speech:  Normal Rate  Volume:  Normal  Mood:  Anxious  Affect:  Congruent  Thought Process:  Coherent and Descriptions of Associations: Intact  Orientation:  Full (Time, Place, and Person)  Thought Content:  Hallucinations: Auditory Olfactory  Suicidal Thoughts:  Yes.  without intent/plan  Homicidal Thoughts:  No  Memory:  Immediate;    Fair Recent;   Fair Remote;   Fair  Judgement:  Impaired  Insight:  Fair  Psychomotor Activity:  Increased  Concentration:  Concentration: Fair and Attention Span: Fair  Recall:  [employer] of Knowledge:  Fair  Language:  Fair  Akathisia:  Negative  Handed:  Right  AIMS (if indicated):     Assets:  Communication Skills Desire for Improvement Resilience Social Support  ADL's:  Intact  Cognition:  WNL  Sleep:  Number of Hours: 6.75     Treatment Plan Summary: Daily contact with patient to assess and evaluate symptoms and progress in treatment, Medication management and Plan : Patient is seen and examined.  Patient is a 27 year old female with the above-stated past psychiatric history seen in follow-up.  We will continue her citalopram for now.  I am going to start the Thorazine 10 mg p.o. 3 times daily.  Hopefully that will help with the auditory hallucinations.  We will continue the Trileptal at bedtime for now, but if the Thorazine sedates her we may get rid of it.  Her GI system is stable at this point.  No change in those medications.  Sharma Covert, MD 06/16/2018, 11:18 AM

## 2018-06-17 MED ORDER — CHLORPROMAZINE HCL 25 MG PO TABS
25.0000 mg | ORAL_TABLET | Freq: Three times a day (TID) | ORAL | Status: DC
  Administered 2018-06-17 – 2018-06-18 (×3): 25 mg via ORAL
  Filled 2018-06-17 (×8): qty 1

## 2018-06-17 NOTE — BHH Group Notes (Signed)
[facility] LCSW Group Therapy Note  Date/Time:    06/17/2018 10:00-11:00AM  Type of Therapy and Topic:  Group Therapy:  Healthy vs Unhealthy Coping Skills  Participation Level:  Active   Description of Group:  The focus of this group was to determine what unhealthy coping techniques typically are used by group members and what healthy coping techniques would be helpful in coping with various problems. Patients were guided in becoming aware of the differences between healthy and unhealthy coping techniques.  Patients were asked to identify 1-2 healthy coping skills they would like to learn to use more effectively, and many mentioned meditation, breathing, and relaxation.  These were explained, samples demonstrated, and resources shared for how to learn more at discharge.   At the end of group, additional ideas of healthy coping skills were shared in a fun exercise.  Therapeutic Goals 1. Patients learned that coping is what human beings do all day long to deal with various situations in their lives 2. Patients defined and discussed healthy vs unhealthy coping techniques 3. Patients identified their preferred coping techniques and identified whether these were healthy or unhealthy 4. Patients determined 1-2 healthy coping skills they would like to become more familiar with and use more often, and practiced a few meditations 5. Patients provided support and ideas to each other  Summary of Patient Progress: During group, patient expressed her healthy coping skills include writing and verbally letting people know how she feels, while her unhealthy coping includes stuffing her feelings back inside and fighting.  She liked the idea of kicking a bill or shooting baskets to relieve some of her anger and anxiety/stress.   Therapeutic Modalities Cognitive Behavioral Therapy Motivational Interviewing   Selmer Dominion, LCSW 06/17/2018, 1:04 PM

## 2018-06-17 NOTE — BHH Group Notes (Signed)
[facility] Group Notes:  Nursing Psychoeducational Group  Date:  06/17/2018  Time:  1:00 PM  Group: Life Skills  Facilitator: Patty D. RN  Type of Therapy:  Psychoeducational Skills  Participation Level:  Active  Participation Quality:  Appropriate  Affect:  Appropriate  Cognitive:  Alert and Oriented  Insight:  Improving  Engagement in Group:  Developing/Improving  Modes of Intervention:  Discussion and Education  Sydney Deleon 06/17/2018, 2:00 PM

## 2018-06-17 NOTE — Progress Notes (Signed)
Nursing note 7p-7a  Pt observed interacting with peers on unit this shift. Displayed a brigt affect and mood upon interaction with this Probation officer. Pt denies pain ,denies SI/HI, and also denies any audio or visual hallucinations at this time.Pt complained of insomnia, see Mar fr prn medication administration. Pt is able to verbally contract for safety with this RN. Goal: "Write in journal all my negative thoughts."  Pt is now resting in bed with eyes closed, with no signs or symptoms of pain or distress noted. Pt continues to remain safe on the unit and is observed by rounding every 15 min. RN will continue to monitor.

## 2018-06-17 NOTE — Progress Notes (Signed)
D Pt is observed OOB UAL on the 400 hall today....she tolerates this fair. She takes her scheduled meds. She remains childlike in her behaviors, but is doing better about making and keeping eye contact with this Probation officer.     A She completed her daily assessment and on this she wrote she deneid SI today and she rated her depression, hopelessness and anxeity " 7/6/7", respectively. She remains tired " from the medicine".     R Safety is in place .

## 2018-06-17 NOTE — Progress Notes (Signed)
Patient ID: Sydney Deleon, female   DOB: Sep 25, 1991, 27 y.o.   MRN: [id number] D: Patient alert and cooperative on approach. Pt reports she had a good day engaging with peers. Pt reports she is tolerating medication well. Denies  SI/HI/AVH and pain.No behavioral issues noted.  A: Support and encouragement offered as needed to express needs. Medications administered as prescribed.  R: Patient is safe and cooperative on unit. Will continue to monitor  for safety and stability.

## 2018-06-17 NOTE — Progress Notes (Signed)
[facility] MD Progress Note  06/17/2018 12:56 PM FUSAKO TANABE  MRN:  [id number] Subjective: Patient is seen and examined.  Patient is a 27 year old female with a reported history of major depression, possible bipolar disorder, posttraumatic stress disorder who is seen in follow-up.  She had some anxiety/stress reaction last night, and felt like the Trileptal was causing her face to swell and to have allergic symptoms.  On examination today her lungs are clear, no wheezing.  I told her we could stop it.  She stated she felt as though the Thorazine was helping her, but was making her mildly sedated.  I asked her to wait 1 more day before we decrease the dose back down.  She stated did help her control her anger and irritability.  She denied any current suicidal ideation.  She stated that she remains anxious about her mother's situation.  She continues to complain of mild auditory hallucinations. Principal Problem: <principal problem not specified> Diagnosis:   Patient Active Problem List   Diagnosis Date Noted  . MDD (major depressive disorder), severe ([place]) [F32.2] 06/14/2018  . MDD (major depressive disorder), recurrent severe, without psychosis ([place]) [F33.2]   . Ulcerative colitis ([place]) [K51.90] 05/12/2018  . Diarrhea [R19.7] 04/26/2018  . Generalized abdominal pain [R10.84] 04/26/2018  . Nausea and vomiting [R11.2] 04/26/2018  . Depression [F32.9]   . Bipolar 1 disorder ([place]) [F31.9]   . Asthma [J45.909]   . Acid reflux [K21.9]    Total Time spent with patient: 15 minutes  Past Psychiatric History: See admission H&P  Past Medical History:  Past Medical History:  Diagnosis Date  . Acid reflux   . Allergy   . Anxiety   . Arthritis   . Asthma   . Bipolar 1 disorder ([place])   . Chronic bronchitis ([place])   . Depression   . Miscarriage within last 12 months     Past Surgical History:  Procedure Laterality Date  . TONSILECTOMY/ADENOIDECTOMY WITH MYRINGOTOMY     Family History:  Family  History  Problem Relation Age of Onset  . Hypertension Mother   . Throat cancer Paternal Grandfather   . Colon cancer Cousin        2 cousins  . Breast cancer Cousin   . Rectal cancer Cousin   . Breast cancer Cousin   . Esophageal cancer Neg Hx   . Stomach cancer Neg Hx    Family Psychiatric  History: See admission H&P Social History:  Social History   Substance and Sexual Activity  Alcohol Use No     Social History   Substance and Sexual Activity  Drug Use No    Social History   Socioeconomic History  . Marital status: Single    Spouse name: Not on file  . Number of children: Not on file  . Years of education: Not on file  . Highest education level: Not on file  Occupational History  . Not on file  Social Needs  . Financial resource strain: Not on file  . Food insecurity:    Worry: Not on file    Inability: Not on file  . Transportation needs:    Medical: Not on file    Non-medical: Not on file  Tobacco Use  . Smoking status: Never Smoker  . Smokeless tobacco: Never Used  Substance and Sexual Activity  . Alcohol use: No  . Drug use: No  . Sexual activity: Yes    Birth control/protection: None    Comment: on period  now  Lifestyle  . Physical activity:    Days per week: Not on file    Minutes per session: Not on file  . Stress: Not on file  Relationships  . Social connections:    Talks on phone: Not on file    Gets together: Not on file    Attends religious service: Not on file    Active member of club or organization: Not on file    Attends meetings of clubs or organizations: Not on file    Relationship status: Not on file  Other Topics Concern  . Not on file  Social History Narrative  . Not on file   Additional Social History:    Pain Medications: See MAR Prescriptions: See MAR Over the Counter: See MAR History of alcohol / drug use?: Yes Longest period of sobriety (when/how long): unknown Name of Substance 1: mariujuana 1 - Last Use /  Amount: pt denies ever using.  She stated she has been around people who use marijuana.                  Sleep: Good  Appetite:  Good  Current Medications: Current Facility-Administered Medications  Medication Dose Route Frequency Provider Last Rate Last Dose  . acetaminophen (TYLENOL) tablet 650 mg  650 mg Oral Q6H PRN Money, Lowry Ram, FNP   650 mg at 06/15/18 2127  . acidophilus (RISAQUAD) capsule 1 capsule  1 capsule Oral Daily Money, Lowry Ram, FNP   1 capsule at 06/17/18 0854  . albuterol (PROVENTIL HFA;VENTOLIN HFA) 108 (90 Base) MCG/ACT inhaler 2 puff  2 puff Inhalation Q4H PRN Money, Lowry Ram, FNP      . alum & mag hydroxide-simeth (MAALOX/MYLANTA) 200-200-20 MG/5ML suspension 30 mL  30 mL Oral Q4H PRN Money, Lowry Ram, FNP   30 mL at 06/15/18 2127  . chlorproMAZINE (THORAZINE) tablet 25 mg  25 mg Oral TID Sharma Covert, MD   25 mg at 06/17/18 0854  . citalopram (CELEXA) tablet 40 mg  40 mg Oral Daily Money, Lowry Ram, FNP   40 mg at 06/17/18 0854  . famotidine (PEPCID) tablet 20 mg  20 mg Oral Daily Money, Lowry Ram, FNP   20 mg at 06/17/18 0854  . hydrOXYzine (ATARAX/VISTARIL) tablet 25 mg  25 mg Oral TID PRN Money, Lowry Ram, FNP   25 mg at 06/14/18 2151  . magnesium hydroxide (MILK OF MAGNESIA) suspension 30 mL  30 mL Oral Daily PRN Money, Lowry Ram, FNP      . mesalamine (LIALDA) EC tablet 2.4 g  2,400 mg Oral Daily Minda Ditto, RPH   2.4 g at 06/17/18 0854  . predniSONE (DELTASONE) tablet 20 mg  20 mg Oral Q breakfast Money, Lowry Ram, FNP   20 mg at 06/17/18 0854  . traZODone (DESYREL) tablet 50 mg  50 mg Oral QHS PRN Money, Lowry Ram, FNP   50 mg at 06/16/18 2135    Lab Results: No results found for this or any previous visit (from the past 48 hour(s)).  Blood Alcohol level:  Lab Results  Component Value Date   ETH <10 27/51/7001    Metabolic Disorder Labs: No results found for: HGBA1C, MPG No results found for: PROLACTIN No results found for: CHOL, TRIG,  HDL, CHOLHDL, VLDL, LDLCALC  Physical Findings: AIMS: Facial and Oral Movements Muscles of Facial Expression: None, normal Lips and Perioral Area: None, normal Jaw: None, normal Tongue: None, normal,Extremity Movements Upper (arms, wrists, hands, fingers):  None, normal Lower (legs, knees, ankles, toes): None, normal, Trunk Movements Neck, shoulders, hips: None, normal, Overall Severity Severity of abnormal movements (highest score from questions above): None, normal Incapacitation due to abnormal movements: None, normal Patient's awareness of abnormal movements (rate only patient's report): No Awareness, Dental Status Current problems with teeth and/or dentures?: No Does patient usually wear dentures?: No  CIWA:    COWS:     Musculoskeletal: Strength & Muscle Tone: within normal limits Gait & Station: normal Patient leans: N/A  Psychiatric Specialty Exam: Physical Exam  Nursing note and vitals reviewed. Constitutional: She is oriented to person, place, and time. She appears well-developed and well-nourished.  HENT:  Head: Normocephalic and atraumatic.  Respiratory: Effort normal.  Neurological: She is alert and oriented to person, place, and time.    ROS  Blood pressure 108/78, pulse 72, temperature 98.1 F (36.7 C), temperature source Oral, resp. rate 16, height 5' 5"  (1.651 m), weight 103.4 kg, last menstrual period 05/25/2018.Body mass index is 37.94 kg/m.  General Appearance: Casual  Eye Contact:  Fair  Speech:  Normal Rate  Volume:  Decreased  Mood:  Anxious  Affect:  Congruent  Thought Process:  Coherent and Descriptions of Associations: Intact  Orientation:  Full (Time, Place, and Person)  Thought Content:  Hallucinations: Auditory  Suicidal Thoughts:  Yes.  without intent/plan  Homicidal Thoughts:  No  Memory:  Immediate;   Fair Recent;   Fair Remote;   Fair  Judgement:  Intact  Insight:  Fair  Psychomotor Activity:  Increased  Concentration:   Concentration: Fair and Attention Span: Fair  Recall:  [employer] of Knowledge:  Fair  Language:  Fair  Akathisia:  Negative  Handed:  Right  AIMS (if indicated):     Assets:  Communication Skills Desire for Improvement Physical Health Resilience Talents/Skills  ADL's:  Intact  Cognition:  WNL  Sleep:  Number of Hours: 6.75     Treatment Plan Summary: Daily contact with patient to assess and evaluate symptoms and progress in treatment, Medication management and Plan : Patient is seen and examined.  Patient is a 27 year old female with the above-stated past psychiatric history seen in follow-up.  Her anxiety is somewhat better, but she still remains anxious.  She still is having some auditory hallucinations.  We increased her Thorazine to 25 mg p.o. 3 times daily.  She feels like it is helping her, but she is mildly sedated from it.  I have asked her to wait 1 more day on the 25 mg dose before we reduce it secondary to sedation.  I am not sure whether or not she had allergic reaction to the Trileptal or not, but I am willing to go on and stop that.  No other complaints from a physical standpoint.  No other changes in her medications.  Sharma Covert, MD 06/17/2018, 12:56 PM

## 2018-06-17 NOTE — Progress Notes (Signed)
Adult Psychoeducational Group Note  Date:  06/17/2018 Time:  10:24 AM  Group Topic/Focus:  Orientation:   The focus of this group is to educate the patient on the purpose and policies of crisis stabilization and provide a format to answer questions about their admission.  The group details unit policies and expectations of patients while admitted.  Participation Level:  Active  Participation Quality:  Appropriate  Affect:  Appropriate  Cognitive:  Alert  Insight: Appropriate  Engagement in Group:  Engaged  Modes of Intervention:  Discussion and Education  Additional Comments:    Pt participated in group. Pt's goal today is to work on her self and be happy. Pt plan to accomplish this goal by writing in her journal and reaching out to staff when she needs help.    Lita Mains 06/17/2018, 10:24 AM

## 2018-06-17 NOTE — Plan of Care (Signed)
  Problem: Education: Goal: Verbalization of understanding the information provided will improve Outcome: Not Progressing

## 2018-06-18 MED ORDER — CHLORPROMAZINE HCL 25 MG PO TABS
25.0000 mg | ORAL_TABLET | Freq: Every day | ORAL | Status: DC
  Administered 2018-06-18 – 2018-06-19 (×2): 25 mg via ORAL
  Filled 2018-06-18 (×5): qty 1

## 2018-06-18 MED ORDER — CHLORPROMAZINE HCL 10 MG PO TABS
10.0000 mg | ORAL_TABLET | Freq: Two times a day (BID) | ORAL | Status: DC
  Administered 2018-06-18 – 2018-06-20 (×6): 10 mg via ORAL
  Filled 2018-06-18 (×9): qty 1

## 2018-06-18 MED ORDER — PREDNISONE 10 MG PO TABS
10.0000 mg | ORAL_TABLET | Freq: Every day | ORAL | Status: DC
  Administered 2018-06-18: 10 mg via ORAL
  Filled 2018-06-18 (×3): qty 1

## 2018-06-18 NOTE — [facility] Group Notes (Signed)
[place] [facility] LCSW Group Therapy Note  06/18/2018  10:00-11:00AM  Type of Therapy and Topic:  Group Therapy:  Being Your Own Support  Participation Level:  Active   Description of Group:  Patients in this group were introduced to the concept that self-support is an essential part of recovery.  A song entitled "My Own Hero" was played and a group discussion ensued in which patients stated they could relate to the song and it inspired them to realize they have be willing to help themselves in order to succeed, because other people cannot achieve sobriety or stability for them.  We discussed adding a variety of healthy supports to address the various needs in their lives.  A song was played called "I Know Where I've Been" toward the end of group and used to conduct an inspirational wrap-up to group of remembering how far they have already come in their journey.  Therapeutic Goals: 1)  demonstrate the importance of being a part of one's own support system 2)  discuss reasons people in one's life may eventually be unable to be continually supportive  3)  identify the patient's current support system and   4)  elicit commitments to add healthy supports and to become more conscious of being self-supportive   Summary of Patient Progress:  The patient expressed initially that she can tell if someone is a healthy or unhealthy support by "their vibe" but she became more insightful as the group progressed.  She acknowledged that she had been drinking coffee and was overly talkative.  She started to see the possibility of putting herself first in terms of taking care of herself prior to losing all her energy to others.  She got very excited about another patient leaving about 10 minutes before group ended and became quite disruptive.    Therapeutic Modalities:   Motivational Interviewing Activity  Maretta Los

## 2018-06-18 NOTE — Plan of Care (Signed)
  Problem: Education: Goal: Knowledge of [place] [facility] Education information/materials will improve Outcome: Progressing

## 2018-06-18 NOTE — Progress Notes (Signed)
[facility] MD Progress Note  06/18/2018 11:37 AM Sydney Deleon  MRN:  [id number] Subjective: Patient is seen and examined.  Patient is a 27 year old female with a past psychiatric history significant for major depression, possible bipolar disorder, posttraumatic stress disorder.  She is seen in follow-up.  She states she is doing better.  She stated that the voices have almost stopped.  She slept better last night with the Thorazine.  She stated that she felt like during the daytime the Thorazine was sedating her.  She also stated that she refused her prednisone this morning.  We discussed that.  She denied any suicidal ideation.  She stated her mother and her aunt were coming to visit her tonight and she was happy about that.  She denied any other side effects to her current medications. Principal Problem: <principal problem not specified> Diagnosis:   Patient Active Problem List   Diagnosis Date Noted  . MDD (major depressive disorder), severe ([place]) [F32.2] 06/14/2018  . MDD (major depressive disorder), recurrent severe, without psychosis ([place]) [F33.2]   . Ulcerative colitis ([place]) [K51.90] 05/12/2018  . Diarrhea [R19.7] 04/26/2018  . Generalized abdominal pain [R10.84] 04/26/2018  . Nausea and vomiting [R11.2] 04/26/2018  . Depression [F32.9]   . Bipolar 1 disorder ([place]) [F31.9]   . Asthma [J45.909]   . Acid reflux [K21.9]    Total Time spent with patient: 15 minutes  Past Psychiatric History: See admission H&P  Past Medical History:  Past Medical History:  Diagnosis Date  . Acid reflux   . Allergy   . Anxiety   . Arthritis   . Asthma   . Bipolar 1 disorder ([place])   . Chronic bronchitis ([place])   . Depression   . Miscarriage within last 12 months     Past Surgical History:  Procedure Laterality Date  . TONSILECTOMY/ADENOIDECTOMY WITH MYRINGOTOMY     Family History:  Family History  Problem Relation Age of Onset  . Hypertension Mother   . Throat cancer Paternal Grandfather   .  Colon cancer Cousin        2 cousins  . Breast cancer Cousin   . Rectal cancer Cousin   . Breast cancer Cousin   . Esophageal cancer Neg Hx   . Stomach cancer Neg Hx    Family Psychiatric  History: See admission H&P Social History:  Social History   Substance and Sexual Activity  Alcohol Use No     Social History   Substance and Sexual Activity  Drug Use No    Social History   Socioeconomic History  . Marital status: Single    Spouse name: Not on file  . Number of children: Not on file  . Years of education: Not on file  . Highest education level: Not on file  Occupational History  . Not on file  Social Needs  . Financial resource strain: Not on file  . Food insecurity:    Worry: Not on file    Inability: Not on file  . Transportation needs:    Medical: Not on file    Non-medical: Not on file  Tobacco Use  . Smoking status: Never Smoker  . Smokeless tobacco: Never Used  Substance and Sexual Activity  . Alcohol use: No  . Drug use: No  . Sexual activity: Yes    Birth control/protection: None    Comment: on period now  Lifestyle  . Physical activity:    Days per week: Not on file  Minutes per session: Not on file  . Stress: Not on file  Relationships  . Social connections:    Talks on phone: Not on file    Gets together: Not on file    Attends religious service: Not on file    Active member of club or organization: Not on file    Attends meetings of clubs or organizations: Not on file    Relationship status: Not on file  Other Topics Concern  . Not on file  Social History Narrative  . Not on file   Additional Social History:    Pain Medications: See MAR Prescriptions: See MAR Over the Counter: See MAR History of alcohol / drug use?: Yes Longest period of sobriety (when/how long): unknown Name of Substance 1: mariujuana 1 - Last Use / Amount: pt denies ever using.  She stated she has been around people who use marijuana.                   Sleep: Good  Appetite:  Good  Current Medications: Current Facility-Administered Medications  Medication Dose Route Frequency Provider Last Rate Last Dose  . acetaminophen (TYLENOL) tablet 650 mg  650 mg Oral Q6H PRN Money, Lowry Ram, FNP   650 mg at 06/15/18 2127  . acidophilus (RISAQUAD) capsule 1 capsule  1 capsule Oral Daily Money, Lowry Ram, FNP   1 capsule at 06/18/18 0802  . albuterol (PROVENTIL HFA;VENTOLIN HFA) 108 (90 Base) MCG/ACT inhaler 2 puff  2 puff Inhalation Q4H PRN Money, Lowry Ram, FNP      . alum & mag hydroxide-simeth (MAALOX/MYLANTA) 200-200-20 MG/5ML suspension 30 mL  30 mL Oral Q4H PRN Money, Lowry Ram, FNP   30 mL at 06/17/18 2128  . chlorproMAZINE (THORAZINE) tablet 10 mg  10 mg Oral BID Sharma Covert, MD      . chlorproMAZINE (THORAZINE) tablet 25 mg  25 mg Oral QHS Sharma Covert, MD      . citalopram (CELEXA) tablet 40 mg  40 mg Oral Daily Money, Lowry Ram, FNP   40 mg at 06/18/18 0802  . famotidine (PEPCID) tablet 20 mg  20 mg Oral Daily Money, Lowry Ram, FNP   20 mg at 06/18/18 0802  . hydrOXYzine (ATARAX/VISTARIL) tablet 25 mg  25 mg Oral TID PRN Money, Lowry Ram, FNP   25 mg at 06/17/18 1613  . magnesium hydroxide (MILK OF MAGNESIA) suspension 30 mL  30 mL Oral Daily PRN Money, Lowry Ram, FNP      . mesalamine (LIALDA) EC tablet 2.4 g  2,400 mg Oral Daily Minda Ditto, RPH   2.4 g at 06/18/18 0802  . predniSONE (DELTASONE) tablet 10 mg  10 mg Oral Q breakfast Sharma Covert, MD      . predniSONE (DELTASONE) tablet 20 mg  20 mg Oral Q breakfast Money, Lowry Ram, FNP   20 mg at 06/17/18 0854  . traZODone (DESYREL) tablet 50 mg  50 mg Oral QHS PRN Money, Lowry Ram, FNP   50 mg at 06/16/18 2135    Lab Results: No results found for this or any previous visit (from the past 48 hour(s)).  Blood Alcohol level:  Lab Results  Component Value Date   ETH <10 57/32/2025    Metabolic Disorder Labs: No results found for: HGBA1C, MPG No results found for:  PROLACTIN No results found for: CHOL, TRIG, HDL, CHOLHDL, VLDL, LDLCALC  Physical Findings: AIMS: Facial and Oral Movements Muscles of Facial Expression: None,  normal Lips and Perioral Area: None, normal Jaw: None, normal Tongue: None, normal,Extremity Movements Upper (arms, wrists, hands, fingers): None, normal Lower (legs, knees, ankles, toes): None, normal, Trunk Movements Neck, shoulders, hips: None, normal, Overall Severity Severity of abnormal movements (highest score from questions above): None, normal Incapacitation due to abnormal movements: None, normal Patient's awareness of abnormal movements (rate only patient's report): No Awareness, Dental Status Current problems with teeth and/or dentures?: No Does patient usually wear dentures?: No  CIWA:    COWS:     Musculoskeletal: Strength & Muscle Tone: within normal limits Gait & Station: normal Patient leans: N/A  Psychiatric Specialty Exam: Physical Exam  Nursing note and vitals reviewed. Constitutional: She is oriented to person, place, and time. She appears well-developed and well-nourished.  HENT:  Head: Normocephalic and atraumatic.  Respiratory: Effort normal.  Neurological: She is alert and oriented to person, place, and time.    ROS  Blood pressure 110/81, pulse 72, temperature 99.1 F (37.3 C), temperature source Oral, resp. rate 20, height 5' 5"  (1.651 m), weight 103.4 kg, last menstrual period 05/25/2018.Body mass index is 37.94 kg/m.  General Appearance: Casual  Eye Contact:  Good  Speech:  Normal Rate  Volume:  Normal  Mood:  Anxious  Affect:  Congruent  Thought Process:  Coherent and Descriptions of Associations: Intact  Orientation:  Full (Time, Place, and Person)  Thought Content:  Logical  Suicidal Thoughts:  No  Homicidal Thoughts:  No  Memory:  Immediate;   Fair Recent;   Fair Remote;   Fair  Judgement:  Intact  Insight:  Fair  Psychomotor Activity:  Normal  Concentration:   Concentration: Fair and Attention Span: Fair  Recall:  [employer] of Knowledge:  Fair  Language:  Fair  Akathisia:  Negative  Handed:  Right  AIMS (if indicated):     Assets:  Communication Skills Desire for Improvement Resilience Social Support  ADL's:  Intact  Cognition:  WNL  Sleep:  Number of Hours: 6.5     Treatment Plan Summary: Daily contact with patient to assess and evaluate symptoms and progress in treatment, Medication management and Plan : Patient is seen and examined.  Patient is a 27 year old female with the above-stated past psychiatric history seen in follow-up.  She is doing better.  She is mildly sedated during the daytime, so I am going to reduce the Thorazine to 10 mg p.o. twice daily, and continue the 25 mg at bedtime.  She also refused her steroids this morning, and I told her to rethink that.  I told her that stopping the steroids abruptly would lead to more problems that she understands.  I did agree to decrease it to 10 mg p.o. daily.  She also continues on the citalopram, famotidine, and mesalamine.  No other problems.  If everything goes well I think we can consider discharge in 1 to 2 days.  Sharma Covert, MD 06/18/2018, 11:37 AM

## 2018-06-18 NOTE — [facility] Group Notes (Signed)
[place] Group Notes:  Nursing Psychoeducational Group  Date:  06/18/2018  Time:  1:00 PM  Group: Life Skills  Facilitator: Patty D. RN  Type of Therapy:  Psychoeducational Skills  Participation Level:  Active  Participation Quality:  Appropriate  Affect:  Appropriate  Cognitive:  Alert and Oriented  Insight:  Improving  Engagement in Group:  Developing/Improving  Modes of Intervention:  Discussion and Education  [name] 06/18/2018, 2:00 PM

## 2018-06-18 NOTE — Progress Notes (Signed)
Pt was observed in the hallway, seen pacing and interacting with peers. Pt attended wrap-up group this evening. Pt appears animated/anxious/silly/somatic in affect and mood. Pt denies HI/AVH at this time.Pt states she had thoughts of cutting her wrist using a pencil. Pt states these thoughts occurred to her while she was outside during recreational time.Pt endorses passive SI but was able to verbal contract for safety with Probation officer. Pt was given a stress ball for anxiety relief.Pt is childlike in mannerism with limited insight and attention-seeking behaviors.Pt needs frequent reinforcement and encouragement from staff.PRN vistaril requsted and given.Will continue with POC.

## 2018-06-18 NOTE — Plan of Care (Signed)
  Problem: Education: Goal: Knowledge of [facility] Education information/materials will improve 06/18/2018 1842 by Sharma Covert, RN Outcome: Progressing 06/18/2018 1622 by Sharma Covert, RN Outcome: Progressing

## 2018-06-18 NOTE — [facility] Suicide Risk Assessment (Signed)
[place] INPATIENT:  Family/Significant Other Suicide Prevention Education  Suicide Prevention Education:  Education Completed; mother, Sydney Deleon [phone] been identified by the patient as the family member/significant other with whom the patient will be residing, and identified as the person(s) who will aid the patient in the event of a mental health crisis (suicidal ideations/suicide attempt).  With written consent from the patient, the family member/significant other has been provided the following suicide prevention education, prior to the and/or following the discharge of the patient.  For the most part, mother already knew the information below and asked for it to be abbreviated and to send a brochure home.  This was done.  She also confirmed there are no firearms in the home where they are staying with a friend.  The suicide prevention education provided includes the following:  Suicide risk factors  Suicide prevention and interventions  National Suicide Hotline telephone number  [facility] assessment telephone number  [facility] Emergency Assistance [place] and/or Residential Mobile Crisis Unit telephone number  Request made of family/significant other to:  Remove weapons (e.g., guns, rifles, knives), all items previously/currently identified as safety concern.    Remove drugs/medications (over-the-counter, prescriptions, illicit drugs), all items previously/currently identified as a safety concern.  The family member/significant other verbalizes understanding of the suicide prevention education information provided.  The family member/significant other agrees to remove the items of safety concern listed above.  Sydney Deleon 06/18/2018, 2:16 PM

## 2018-06-19 NOTE — Progress Notes (Signed)
D: Patient is pleasant and interacts well with staff.  She rates her depression and anxiety as a 7.  She takes vistaril for her anxiety and has had one dose this shift.  She denies any thoughts of self harm.  Patient is sleeping and eating well; her energy level is low; her concentration is poor.  Her goal is to "get the right dosage fixed.  A: Continue to monitor medication management and MD orders.  Safety checks completed every 15 minutes per protocol.  Offer support and encouragement as needed.  R: Patient is receptive to staff; her behavior is appropriate.

## 2018-06-19 NOTE — Progress Notes (Signed)
Pt was observed in dayroom, seen watching TV. Pt attended wrap-up group this evening. Pt appears animated/anxious/silly/somatic in affect and mood. Pt denies HI/AVH at this time.Pt endorses passive SI but was able to verbal contract for safety with Probation officer. Pt c/o of bilat hand tremors.Pt states she hopes to leave tomorrow. Pt remains childlike in mannerism with limited insight and attention-seeking behaviors.Pt needs frequent reinforcement and encouragement from staff.PRN vistaril requsted and given.Will continue with POC.

## 2018-06-19 NOTE — Progress Notes (Signed)
Adult Psychoeducational Group Note  Date:  06/19/2018 Time:  10:52 PM  Group Topic/Focus:  Wrap-Up Group:   The focus of this group is to help patients review their daily goal of treatment and discuss progress on daily workbooks.  Participation Level:  Active  Participation Quality:  Appropriate  Affect:  Appropriate  Cognitive:  Appropriate  Insight: Appropriate  Engagement in Group:  Engaged  Modes of Intervention:  Discussion  Additional Comments:  Pt spoke with doctor about dosage changes. Pt she didn't need her stress ball today because her anxiety is lessening. Pt rated the day at a 8/10.  Marisol Giambra 06/19/2018, 10:52 PM

## 2018-06-19 NOTE — Progress Notes (Signed)
Recreation Therapy Notes  Date: 06/19/18 Time: 0930 Location: [address] Dayroom  Group Topic: Stress Management  Goal Area(s) Addresses:  Patient will verbalize importance of using healthy stress management.  Patient will identify positive emotions associated with healthy stress management.   Intervention: Stress Management  Activity :  Meditation.  LRT introduced the stress management technique of meditation.  Patients were to listen and follow along as the meditation played in order to relax.  Education:  Stress Management, Discharge Planning.   Education Outcome: Acknowledges edcuation/In group clarification offered/Needs additional education  Clinical Observations/Feedback: Pt did not attend group.     Victorino Sparrow, LRT/CTRS         Victorino Sparrow A 06/19/2018 12:21 PM

## 2018-06-20 DIAGNOSIS — G4700 Insomnia, unspecified: Secondary | ICD-10-CM

## 2018-06-20 DIAGNOSIS — F419 Anxiety disorder, unspecified: Secondary | ICD-10-CM

## 2018-06-20 MED ORDER — PREDNISONE 10 MG PO TABS
10.0000 mg | ORAL_TABLET | Freq: Every day | ORAL | Status: DC
  Administered 2018-06-21 – 2018-06-23 (×3): 10 mg via ORAL
  Filled 2018-06-20: qty 1
  Filled 2018-06-20: qty 2
  Filled 2018-06-20 (×4): qty 1

## 2018-06-20 MED ORDER — CHLORPROMAZINE HCL 10 MG PO TABS
10.0000 mg | ORAL_TABLET | Freq: Three times a day (TID) | ORAL | Status: DC
  Administered 2018-06-21: 10 mg via ORAL
  Filled 2018-06-20 (×4): qty 1

## 2018-06-20 NOTE — [facility] Group Notes (Signed)
LCSW Group Therapy Note 06/20/2018 11:03 AM  Type of Therapy and Topic: Group Therapy: Overcoming Obstacles  Participation Level: Active  Description of Group:  In this group patients will be encouraged to explore what they see as obstacles to their own wellness and recovery. They will be guided to discuss their thoughts, feelings, and behaviors related to these obstacles. The group will process together ways to cope with barriers, with attention given to specific choices patients can make. Each patient will be challenged to identify changes they are motivated to make in order to overcome their obstacles. This group will be process-oriented, with patients participating in exploration of their own experiences as well as giving and receiving support and challenge from other group members.  Therapeutic Goals: 1. Patient will identify personal and current obstacles as they relate to admission. 2. Patient will identify barriers that currently interfere with their wellness or overcoming obstacles.  3. Patient will identify feelings, thought process and behaviors related to these barriers. 4. Patient will identify two changes they are willing to make to overcome these obstacles:   Summary of Patient Progress  Sydney Deleon was engaged and participated throughout the group session. Sydney Deleon reports her current obstacle is "not having permanent housing". Sydney Deleon states that the company working on repairing her old home, that was condemned has not been in contact with updates, which causes her to stress about her long term living situation. Sydney Deleon states that communication will help her overcome this obstacle moving forward.     Therapeutic Modalities:  Cognitive Behavioral Therapy Solution Focused Therapy Motivational Interviewing Relapse Prevention Therapy   Theresa Duty Clinical Social Worker

## 2018-06-20 NOTE — Progress Notes (Signed)
D: Patient appears drowsy and lethargic.  She states, "I feel like I am going to fall backwards."  Patient was directed to the day room to sit down.  Advised patient to rise slowly and use handrails if necessary. She could not explain why she was feeling light headed, as she did not take any sleep medication.  She denies any thoughts of self harm today.  Her goal today is to "realize that it is ok to speak up about what is wrong and work through it."  She rates her depression and anxiety as a 5, which is an improvement.  She denies any hopeless feelings.  A: Continue to monitor medication management and MD orders.  Safety checks completed every 15 minutes per protocol.  Offer support and encouragement as needed.  R: Patient is receptive to staff; her behavior is appropriate.

## 2018-06-20 NOTE — Progress Notes (Addendum)
[facility] MD Progress Note  06/20/2018 2:40 PM Sydney Deleon  MRN:  [id number]   Subjective: Patient reports that she feels  " a lot better".    She denies any medication side effects.   She states that she has been sleeping well and has a good appetite.   She denies any SI/HI/AVH and contracts for safety.     Objective: Patient's chart and findings reviewed and discussed with treatment team.   27 year old, no children, currently homeless, lives in a church with her mother. Presented with suicidal ideations, depression, which she attributes to significant stressors, including homelessness . She also reports she feels prednisone trial was contributing to mood symptoms.  Of note, she reports she has been on Prednisone at 20 mgrs x 2 months at least, originally prescribed by her PCP due to Ulcerative Colitis . She states that she feels prednisone did cause her to feel more " moody", anxious, and is interested in stopping, but we reviewed potential concerns of abruptly stopping Prednisone. Dr. Karmen Stabs note indicates that he had initiated taper to 10 mgrs daily .  Vitals are stable, currently not orthostatic . She is visible in day room, interacting with peers and staff appropriately.  Patient has been attending groups. Denies suicidal ideations. Denies medication side effects ( Currently on Celexa, Thorazine)      Principal Problem: MDD (major depressive disorder), severe ([place]) Diagnosis:   Patient Active Problem List   Diagnosis Date Noted  . MDD (major depressive disorder), severe ([place]) [F32.2] 06/14/2018  . MDD (major depressive disorder), recurrent severe, without psychosis ([place]) [F33.2]   . Ulcerative colitis ([place]) [K51.90] 05/12/2018  . Diarrhea [R19.7] 04/26/2018  . Generalized abdominal pain [R10.84] 04/26/2018  . Nausea and vomiting [R11.2] 04/26/2018  . Depression [F32.9]   . Bipolar 1 disorder ([place]) [F31.9]   . Asthma [J45.909]   . Acid reflux [K21.9]    Total Time spent with  patient: 20 minutes  Past Psychiatric History: See H&P  Past Medical History:  Past Medical History:  Diagnosis Date  . Acid reflux   . Allergy   . Anxiety   . Arthritis   . Asthma   . Bipolar 1 disorder ([place])   . Chronic bronchitis ([place])   . Depression   . Miscarriage within last 12 months     Past Surgical History:  Procedure Laterality Date  . TONSILECTOMY/ADENOIDECTOMY WITH MYRINGOTOMY     Family History:  Family History  Problem Relation Age of Onset  . Hypertension Mother   . Throat cancer Paternal Grandfather   . Colon cancer Cousin        2 cousins  . Breast cancer Cousin   . Rectal cancer Cousin   . Breast cancer Cousin   . Esophageal cancer Neg Hx   . Stomach cancer Neg Hx    Family Psychiatric  History: See H&P Social History:  Social History   Substance and Sexual Activity  Alcohol Use No     Social History   Substance and Sexual Activity  Drug Use No    Social History   Socioeconomic History  . Marital status: Single    Spouse name: Not on file  . Number of children: Not on file  . Years of education: Not on file  . Highest education level: Not on file  Occupational History  . Not on file  Social Needs  . Financial resource strain: Not on file  . Food insecurity:    Worry: Not  on file    Inability: Not on file  . Transportation needs:    Medical: Not on file    Non-medical: Not on file  Tobacco Use  . Smoking status: Never Smoker  . Smokeless tobacco: Never Used  Substance and Sexual Activity  . Alcohol use: No  . Drug use: No  . Sexual activity: Yes    Birth control/protection: None    Comment: on period now  Lifestyle  . Physical activity:    Days per week: Not on file    Minutes per session: Not on file  . Stress: Not on file  Relationships  . Social connections:    Talks on phone: Not on file    Gets together: Not on file    Attends religious service: Not on file    Active member of club or organization: Not on file     Attends meetings of clubs or organizations: Not on file    Relationship status: Not on file  Other Topics Concern  . Not on file  Social History Narrative  . Not on file   Additional Social History:    Pain Medications: See MAR Prescriptions: See MAR Over the Counter: See MAR History of alcohol / drug use?: Yes Longest period of sobriety (when/how long): unknown Name of Substance 1: mariujuana 1 - Last Use / Amount: pt denies ever using.  She stated she has been around people who use marijuana.  Sleep: Good  Appetite:  Good  Current Medications: Current Facility-Administered Medications  Medication Dose Route Frequency Provider Last Rate Last Dose  . acetaminophen (TYLENOL) tablet 650 mg  650 mg Oral Q6H PRN Money, Lowry Ram, FNP   650 mg at 06/15/18 2127  . acidophilus (RISAQUAD) capsule 1 capsule  1 capsule Oral Daily Money, Lowry Ram, FNP   1 capsule at 06/20/18 0818  . albuterol (PROVENTIL HFA;VENTOLIN HFA) 108 (90 Base) MCG/ACT inhaler 2 puff  2 puff Inhalation Q4H PRN Money, Lowry Ram, FNP      . alum & mag hydroxide-simeth (MAALOX/MYLANTA) 200-200-20 MG/5ML suspension 30 mL  30 mL Oral Q4H PRN Money, Lowry Ram, FNP   30 mL at 06/17/18 2128  . chlorproMAZINE (THORAZINE) tablet 10 mg  10 mg Oral BID Sharma Covert, MD   10 mg at 06/20/18 0818  . chlorproMAZINE (THORAZINE) tablet 25 mg  25 mg Oral QHS Sharma Covert, MD   25 mg at 06/19/18 2114  . citalopram (CELEXA) tablet 40 mg  40 mg Oral Daily Money, Lowry Ram, FNP   40 mg at 06/20/18 0818  . famotidine (PEPCID) tablet 20 mg  20 mg Oral Daily Money, Lowry Ram, FNP   20 mg at 06/20/18 0818  . hydrOXYzine (ATARAX/VISTARIL) tablet 25 mg  25 mg Oral TID PRN Money, Lowry Ram, FNP   25 mg at 06/19/18 2114  . magnesium hydroxide (MILK OF MAGNESIA) suspension 30 mL  30 mL Oral Daily PRN Money, Lowry Ram, FNP      . mesalamine (LIALDA) EC tablet 2.4 g  2,400 mg Oral Daily Minda Ditto, RPH   2.4 g at 06/20/18 0818  . traZODone  (DESYREL) tablet 50 mg  50 mg Oral QHS PRN Money, Lowry Ram, FNP   50 mg at 06/16/18 2135    Lab Results: No results found for this or any previous visit (from the past 48 hour(s)).  Blood Alcohol level:  Lab Results  Component Value Date   ETH <10 06/13/2018  Metabolic Disorder Labs: No results found for: HGBA1C, MPG No results found for: PROLACTIN No results found for: CHOL, TRIG, HDL, CHOLHDL, VLDL, LDLCALC  Physical Findings: AIMS: Facial and Oral Movements Muscles of Facial Expression: None, normal Lips and Perioral Area: None, normal Jaw: None, normal Tongue: None, normal,Extremity Movements Upper (arms, wrists, hands, fingers): None, normal Lower (legs, knees, ankles, toes): None, normal, Trunk Movements Neck, shoulders, hips: None, normal, Overall Severity Severity of abnormal movements (highest score from questions above): None, normal Incapacitation due to abnormal movements: None, normal Patient's awareness of abnormal movements (rate only patient's report): No Awareness, Dental Status Current problems with teeth and/or dentures?: No Does patient usually wear dentures?: No  CIWA:    COWS:     Musculoskeletal: Strength & Muscle Tone: within normal limits Gait & Station: normal Patient leans: N/A  Psychiatric Specialty Exam: Physical Exam  Nursing note and vitals reviewed. Constitutional: She is oriented to person, place, and time. She appears well-developed and well-nourished.  Cardiovascular: Normal rate.  Respiratory: Effort normal.  Musculoskeletal: Normal range of motion.  Neurological: She is alert and oriented to person, place, and time.  Skin: Skin is warm.    Review of Systems  Constitutional: Negative.   HENT: Negative.   Eyes: Negative.   Respiratory: Negative.   Cardiovascular: Negative.   Gastrointestinal: Negative.   Genitourinary: Negative.   Musculoskeletal: Negative.   Skin: Negative.   Neurological: Negative.    Endo/Heme/Allergies: Negative.   Psychiatric/Behavioral: Negative.   reports improving GI symptoms, currently reports only mild sense " bloating". Denies cramping, denies diarrhea, denies  lower GI bleed, no fever, no chills   Blood pressure 110/82, pulse 89, temperature 98 F (36.7 C), temperature source Oral, resp. rate 16, height 5' 5"  (1.651 m), weight 103.4 kg, last menstrual period 05/25/2018.Body mass index is 37.94 kg/m.  General Appearance: improving grooming   Eye Contact:  Good  Speech:  Normal Rate  Volume:  Normal  Mood:  reports improving mood, states she feels significantly better than on admission  Affect:  Appropriate and reactive  Thought Process:  Goal Directed and Descriptions of Associations: Intact  Orientation:  Full (Time, Place, and Person)  Thought Content:  no hallucinations, no delusions  Suicidal Thoughts:  No denies suicidal or self injurious ideations, denies homicidal ideations   Homicidal Thoughts:  No  Memory:  recent and remote grossly intact   Judgement:  Improving   Insight:  Fair improving   Psychomotor Activity:  Normal  Concentration:  Concentration: Good and Attention Span: Good  Recall:  Good  Fund of Knowledge:  Good  Language:  Good  Akathisia:  No  Handed:  Right  AIMS (if indicated):     Assets:  Communication Skills Desire for Improvement Financial Resources/Insurance Housing Social Support Transportation  ADL's:  Intact  Cognition:  WNL  Sleep:  Number of Hours: 6.75   Assessment - 27 year old female , presented for depression, suicidal ideations in the context of severe  Psychosocial stressors-homelessness . She also reports recent diagnosis of Ulcerative Cholitis, and states she has been managed with prednisone since July. Attributes some of her recent mood symptoms to steroid management . Currently reports feeling better, denies suicidal ideations, and is future oriented . Of note, we reviewed rationale to avoid abrupt  discontinuation of prednisone after being on it for several weeks, and note that Dr. Mallie Darting had initiated a taper/ decreased dose to 10 mgrs QDAY . Reports good response to medication but states  she feels subjectively sedated, which she attributes to medication ( Thorazine)   Treatment Plan Summary: Daily contact with patient to assess and evaluate symptoms and progress in treatment, Medication management and Plan is to:  Treatment Plan reviewed as below today 9/3 Decrease Thorazine 10 mg TID for mood disorder - see above  Continue  Celexa 40 mg QDAY for depression- states she has been on this medication for several years  Continue Trazodone 50 mg QHS PRN for insomnia Continue Hydroxyzine 25 mg TID PRN daily as needed for anxiety Resume Prednisone at 10 mgrs QDAY and recommend gradual taper under guidance of outpatient prescriber Encourage group therapy participation  F Cobos MD

## 2018-06-21 DIAGNOSIS — F322 Major depressive disorder, single episode, severe without psychotic features: Secondary | ICD-10-CM

## 2018-06-21 MED ORDER — ARIPIPRAZOLE 2 MG PO TABS
2.0000 mg | ORAL_TABLET | Freq: Every day | ORAL | Status: DC
  Administered 2018-06-21 – 2018-06-23 (×3): 2 mg via ORAL
  Filled 2018-06-21 (×6): qty 1

## 2018-06-21 NOTE — [facility] Group Notes (Signed)
[facility] Group Therapy 06/21/2018 1:15pm  Type of Therapy: [facility] Presentation  Participation Level: Invited. DID NOT ATTEND/ pt chose to remain in bed.   Avelina Laine, LCSW 06/21/2018 11:49 AM

## 2018-06-21 NOTE — Progress Notes (Signed)
[facility] MD Progress Note  06/21/2018 8:27 AM Sydney Deleon  MRN:  [id number]   Subjective: Patient reports feeling more anxious this morning, describes feeling subjectively "dizzy", denies suicidal ideations.    Objective: Patient's chart and findings reviewed and discussed with treatment team.   27 year old, no children, currently homeless, lives in a church with her mother. Presented with suicidal ideations, depression, which she attributes to significant stressors, including homelessness . She also reports she feels prednisone trial was contributing to mood symptoms and increased anxiety.  She has been started on prednisone in July for diagnosis of inflammatory bowel disease/ulcerative colitis.  Had been taking 20 mg daily up to admission. Today we reviewed rationale and importance of avoiding an abrupt cessation of prednisone-as noted, has been on this medication for several weeks.  Patient feels that prednisone has contributed to increased anxiety and depression and wants to discontinue medication.  Of note, states her GI symptoms have improved and currently denies significant abdominal pain or cramping or diarrhea/denies melena. I have discussed case with hospitalist consultant, who agrees that prednisone should be tapered gradually. Today presents anxious, somatically focused, reporting subjective feelings of dizziness/lightheadedness.  Gait is steady, vitals are stable, pulse ox 100%.  Denies suicidal ideations.     Principal Problem: MDD (major depressive disorder), severe ([place]) Diagnosis:   Patient Active Problem List   Diagnosis Date Noted  . MDD (major depressive disorder), severe ([place]) [F32.2] 06/14/2018  . MDD (major depressive disorder), recurrent severe, without psychosis ([place]) [F33.2]   . Ulcerative colitis ([place]) [K51.90] 05/12/2018  . Diarrhea [R19.7] 04/26/2018  . Generalized abdominal pain [R10.84] 04/26/2018  . Nausea and vomiting [R11.2] 04/26/2018  . Depression  [F32.9]   . Bipolar 1 disorder ([place]) [F31.9]   . Asthma [J45.909]   . Acid reflux [K21.9]    Total Time spent with patient: 20 minutes  Past Psychiatric History: See H&P  Past Medical History:  Past Medical History:  Diagnosis Date  . Acid reflux   . Allergy   . Anxiety   . Arthritis   . Asthma   . Bipolar 1 disorder ([place])   . Chronic bronchitis ([place])   . Depression   . Miscarriage within last 12 months     Past Surgical History:  Procedure Laterality Date  . TONSILECTOMY/ADENOIDECTOMY WITH MYRINGOTOMY     Family History:  Family History  Problem Relation Age of Onset  . Hypertension Mother   . Throat cancer Paternal Grandfather   . Colon cancer Cousin        2 cousins  . Breast cancer Cousin   . Rectal cancer Cousin   . Breast cancer Cousin   . Esophageal cancer Neg Hx   . Stomach cancer Neg Hx    Family Psychiatric  History: See H&P Social History:  Social History   Substance and Sexual Activity  Alcohol Use No     Social History   Substance and Sexual Activity  Drug Use No    Social History   Socioeconomic History  . Marital status: Single    Spouse name: Not on file  . Number of children: Not on file  . Years of education: Not on file  . Highest education level: Not on file  Occupational History  . Not on file  Social Needs  . Financial resource strain: Not on file  . Food insecurity:    Worry: Not on file    Inability: Not on file  . Transportation needs:  Medical: Not on file    Non-medical: Not on file  Tobacco Use  . Smoking status: Never Smoker  . Smokeless tobacco: Never Used  Substance and Sexual Activity  . Alcohol use: No  . Drug use: No  . Sexual activity: Yes    Birth control/protection: None    Comment: on period now  Lifestyle  . Physical activity:    Days per week: Not on file    Minutes per session: Not on file  . Stress: Not on file  Relationships  . Social connections:    Talks on phone: Not on file    Gets  together: Not on file    Attends religious service: Not on file    Active member of club or organization: Not on file    Attends meetings of clubs or organizations: Not on file    Relationship status: Not on file  Other Topics Concern  . Not on file  Social History Narrative  . Not on file   Additional Social History:    Pain Medications: See MAR Prescriptions: See MAR Over the Counter: See MAR History of alcohol / drug use?: Yes Longest period of sobriety (when/how long): unknown Name of Substance 1: mariujuana 1 - Last Use / Amount: pt denies ever using.  She stated she has been around people who use marijuana.  Sleep: Fair  Appetite:  Improving  Current Medications: Current Facility-Administered Medications  Medication Dose Route Frequency Provider Last Rate Last Dose  . acetaminophen (TYLENOL) tablet 650 mg  650 mg Oral Q6H PRN Money, Lowry Ram, FNP   650 mg at 06/15/18 2127  . acidophilus (RISAQUAD) capsule 1 capsule  1 capsule Oral Daily Money, Lowry Ram, FNP   1 capsule at 06/21/18 0811  . albuterol (PROVENTIL HFA;VENTOLIN HFA) 108 (90 Base) MCG/ACT inhaler 2 puff  2 puff Inhalation Q4H PRN Money, Lowry Ram, FNP      . alum & mag hydroxide-simeth (MAALOX/MYLANTA) 200-200-20 MG/5ML suspension 30 mL  30 mL Oral Q4H PRN Money, Lowry Ram, FNP   30 mL at 06/17/18 2128  . chlorproMAZINE (THORAZINE) tablet 10 mg  10 mg Oral TID Hazelle Woollard, Myer Peer, MD   10 mg at 06/21/18 0813  . citalopram (CELEXA) tablet 40 mg  40 mg Oral Daily Money, Lowry Ram, FNP   40 mg at 06/21/18 0813  . famotidine (PEPCID) tablet 20 mg  20 mg Oral Daily Money, Lowry Ram, FNP   20 mg at 06/21/18 0813  . hydrOXYzine (ATARAX/VISTARIL) tablet 25 mg  25 mg Oral TID PRN Money, Lowry Ram, FNP   25 mg at 06/19/18 2114  . magnesium hydroxide (MILK OF MAGNESIA) suspension 30 mL  30 mL Oral Daily PRN Money, Lowry Ram, FNP      . mesalamine (LIALDA) EC tablet 2.4 g  2,400 mg Oral Daily Minda Ditto, RPH   2.4 g at 06/21/18  0811  . predniSONE (DELTASONE) tablet 10 mg  10 mg Oral Q breakfast Travone Georg, Myer Peer, MD   10 mg at 06/21/18 0817  . traZODone (DESYREL) tablet 50 mg  50 mg Oral QHS PRN Money, Lowry Ram, FNP   50 mg at 06/16/18 2135    Lab Results: No results found for this or any previous visit (from the past 48 hour(s)).  Blood Alcohol level:  Lab Results  Component Value Date   ETH <10 88/50/2774    Metabolic Disorder Labs: No results found for: HGBA1C, MPG No results found for: PROLACTIN  No results found for: CHOL, TRIG, HDL, CHOLHDL, VLDL, LDLCALC  Physical Findings: AIMS: Facial and Oral Movements Muscles of Facial Expression: None, normal Lips and Perioral Area: None, normal Jaw: None, normal Tongue: None, normal,Extremity Movements Upper (arms, wrists, hands, fingers): None, normal Lower (legs, knees, ankles, toes): None, normal, Trunk Movements Neck, shoulders, hips: None, normal, Overall Severity Severity of abnormal movements (highest score from questions above): None, normal Incapacitation due to abnormal movements: None, normal Patient's awareness of abnormal movements (rate only patient's report): No Awareness, Dental Status Current problems with teeth and/or dentures?: No Does patient usually wear dentures?: No  CIWA:    COWS:     Musculoskeletal: Strength & Muscle Tone: within normal limits Gait & Station: normal Patient leans: N/A  Psychiatric Specialty Exam: Physical Exam  Nursing note and vitals reviewed. Constitutional: She is oriented to person, place, and time. She appears well-developed and well-nourished.  Cardiovascular: Normal rate.  Respiratory: Effort normal.  Musculoskeletal: Normal range of motion.  Neurological: She is alert and oriented to person, place, and time.  Skin: Skin is warm.    Review of Systems  Constitutional: Negative.   HENT: Negative.   Eyes: Negative.   Respiratory: Negative.   Cardiovascular: Negative.   Gastrointestinal:  Negative.   Genitourinary: Negative.   Musculoskeletal: Negative.   Skin: Negative.   Neurological: Negative.   Endo/Heme/Allergies: Negative.   Psychiatric/Behavioral: Negative.   Reports feeling lightheaded, no chest pain, no shortness of breath, no nausea, no vomiting, denies melenas  Blood pressure 110/82, pulse 89, temperature 98 F (36.7 C), temperature source Oral, resp. rate 16, height 5' 5"  (1.651 m), weight 103.4 kg, last menstrual period 05/25/2018.Body mass index is 37.94 kg/m.  General Appearance: improving grooming   Eye Contact:  Good  Speech:  Normal Rate  Volume:  Normal  Mood:  Improving mood, remains vaguely depressed and anxious   Affect:  Congruent  Thought Process:  Goal Directed and Descriptions of Associations: Intact  Orientation:  Full (Time, Place, and Person)-presents fully alert, attentive, oriented x3  Thought Content:  no hallucinations, no delusions  Suicidal Thoughts:  No denies suicidal or self injurious ideations, denies homicidal ideations   Homicidal Thoughts:  No  Memory:  recent and remote grossly intact   Judgement:  Improving   Insight:  Fair improving   Psychomotor Activity:  Normal  Concentration:  Concentration: Good and Attention Span: Good  Recall:  Good  Fund of Knowledge:  Good  Language:  Good  Akathisia:  No  Handed:  Right  AIMS (if indicated):     Assets:  Communication Skills Desire for Improvement Financial Resources/Insurance Housing Social Support Transportation  ADL's:  Intact  Cognition:  WNL  Sleep:  Number of Hours: 5.5   Assessment - 27 year old female , presented for depression, suicidal ideations in the context of severe psychosocial stressors-homelessness . She also reports recent diagnosis of Ulcerative Cholitis, and states she has been managed with prednisone since July. Attributes some of her recent mood symptoms and increased anxiety to steroid management. Symptoms of Ulcerative Colitis have abated and  currently denies abdominal pain , diarrhea or GI bleeding. Expresses understanding that steroid needs to be tapered down gradually rather than stopped suddenly . Reports partially improved mood, reports anxiety, denies SI. Patient reports dizziness , lightheadedness which may be related to Thorazine. Will discontinue   Treatment Plan Summary: Daily contact with patient to assess and evaluate symptoms and progress in treatment, Medication management and Plan is  to:  Treatment Plan reviewed as below today 9/4 D/C Thorazine- see rationale above  Start Abilify 2 mgrs QDAY for mood disorder /antidepressant augmentation Continue  Celexa 40 mg QDAY for depression- states she has been on this medication for several years  Continue Trazodone 50 mg QHS PRN for insomnia Continue Hydroxyzine 25 mg TID PRN daily as needed for anxiety Continue  Prednisone at 10 mgrs QDAY- have consulted hospitalist regarding prednisone taper recommendation- recommendation is to taper to 10 mgrs x 4 days, then to 5 mgrs x 4 days, prior to discontinuing .   Gabriel Earing MD  Patient ID: Sydney Deleon, female   DOB: [birth year], 27 y.o.   MRN: [id number]

## 2018-06-21 NOTE — Progress Notes (Signed)
Patient ID: Sydney Deleon, female   DOB: 1991-04-20, 27 y.o.   MRN: [id number]   D: Patient denies SI/HI and auditory and visual hallucinations.Patient stated that she does not want to take prednisone because she feels it is making her sicker. This AM after taking it she felt lightheaded and thought she was going to faint.  Her vitals were stable. She reported to this writer that her stomach still hurts. Affect depressed  A: Patient given emotional support from RN. Patient given medications per MD orders. Patient encouraged to attend groups and unit activities. Patient encouraged to come to staff with any questions or concerns.  R: Patient remains cooperative and appropriate. Will continue to monitor patient for safety.

## 2018-06-21 NOTE — Progress Notes (Signed)
Dr Parke Poisson from [facility] called with questions about how to taper off prednisone  Patient apparently takes prednisone for IBD, started on prednisone almost 5 weeks ago on  20 mg daily, she c/o  of insomnia/anxiety/restlessness/nervousness due to steroids.  Her GI symptoms have subsided, no abdominal pain no diarrhea  Patient strongly believes that the prednisone is affecting her mental health and is refusing to take prednisone any further  Dr. Parke Poisson already decreased prednisone to 10 mg daily as of 06/20/2018  A/p IBD --- much improved on prednisone, patient has been on 20 mg for about 5 weeks now, it was decreased to 10 mg daily on 06/20/2018, advised to continue at least 10 mg daily for 4 days and then 5 mg daily for another 4 days prior to discontinuing prednisone, avoid abrupt discontinuation.  Follow-up with gastroenterologist if GI symptoms worsen with attempt to taper off prednisone   continue mesalamine   Hospitalist service will sign off at this time please recall as needed  Roxan Hockey, MD

## 2018-06-21 NOTE — Progress Notes (Signed)
  D: When asked about her day pt stated, "It's been going. Can't complain". Informed the writer that she's "happy about adjustments to her medicine", but isn't happy because she's back on prednisone. When asked, pt stated the prednisone causes me to be irritable, angry, upset, crying over the littlest things". Stated she has an appt on the 18th with her GI Dr. Lenon Ahmadi her last day of SI or HI was Sunday. Pt has no questions or concerns.    A:  Support and encouragement was offered. 15 min checks continued for safety.  R: Pt remains safe.

## 2018-06-21 NOTE — Tx Team (Signed)
Interdisciplinary Treatment and Diagnostic Plan Update  06/21/2018 Time of Session: 0910 Sydney Deleon MRN: [id number]  Principal Diagnosis: MDD (major depressive disorder), severe ([place])  Secondary Diagnoses: Principal Problem:   MDD (major depressive disorder), severe ([place]) Active Problems:   Ulcerative colitis ([place])   Current Medications:  Current Facility-Administered Medications  Medication Dose Route Frequency Provider Last Rate Last Dose  . acetaminophen (TYLENOL) tablet 650 mg  650 mg Oral Q6H PRN Money, Lowry Ram, FNP   650 mg at 06/15/18 2127  . acidophilus (RISAQUAD) capsule 1 capsule  1 capsule Oral Daily Money, Lowry Ram, FNP   1 capsule at 06/21/18 0811  . albuterol (PROVENTIL HFA;VENTOLIN HFA) 108 (90 Base) MCG/ACT inhaler 2 puff  2 puff Inhalation Q4H PRN Money, Lowry Ram, FNP      . alum & mag hydroxide-simeth (MAALOX/MYLANTA) 200-200-20 MG/5ML suspension 30 mL  30 mL Oral Q4H PRN Money, Lowry Ram, FNP   30 mL at 06/17/18 2128  . ARIPiprazole (ABILIFY) tablet 2 mg  2 mg Oral Daily Cobos, Myer Peer, MD   2 mg at 06/21/18 1258  . citalopram (CELEXA) tablet 40 mg  40 mg Oral Daily Money, Lowry Ram, FNP   40 mg at 06/21/18 0813  . famotidine (PEPCID) tablet 20 mg  20 mg Oral Daily Money, Lowry Ram, FNP   20 mg at 06/21/18 0813  . hydrOXYzine (ATARAX/VISTARIL) tablet 25 mg  25 mg Oral TID PRN Money, Lowry Ram, FNP   25 mg at 06/19/18 2114  . magnesium hydroxide (MILK OF MAGNESIA) suspension 30 mL  30 mL Oral Daily PRN Money, Lowry Ram, FNP      . mesalamine (LIALDA) EC tablet 2.4 g  2,400 mg Oral Daily Minda Ditto, RPH   2.4 g at 06/21/18 0811  . predniSONE (DELTASONE) tablet 10 mg  10 mg Oral Q breakfast Cobos, Myer Peer, MD   10 mg at 06/21/18 0817  . traZODone (DESYREL) tablet 50 mg  50 mg Oral QHS PRN Money, Lowry Ram, FNP   50 mg at 06/16/18 2135   PTA Medications: Medications Prior to Admission  Medication Sig Dispense Refill Last Dose  . albuterol (PROVENTIL  HFA;VENTOLIN HFA) 108 (90 Base) MCG/ACT inhaler Inhale 2 puffs into the lungs every 4 (four) hours as needed for wheezing or shortness of breath.   Not Taking at Unknown time  . citalopram (CELEXA) 40 MG tablet Take 40 mg by mouth daily.   06/12/2018 at Unknown time  . diphenoxylate-atropine (LOMOTIL) 2.5-0.025 MG tablet Take 2 tablets by mouth every 8 (eight) hours as needed for diarrhea or loose stools. 30 tablet 1 06/13/2018 at Unknown time  . feeding supplement (BOOST HIGH PROTEIN) LIQD Take 1 Container by mouth daily.   Past Week at Unknown time  . ibuprofen (ADVIL,MOTRIN) 600 MG tablet Take 1 tablet (600 mg total) by mouth every 6 (six) hours as needed. (Patient not taking: Reported on 06/13/2018) 30 tablet 1 Not Taking at Unknown time  . Mesalamine 800 MG TBEC Take 3 tablets (2,400 mg total) by mouth daily. 180 tablet 3   . Multiple Vitamin (MULTIVITAMIN WITH MINERALS) TABS tablet Take 1 tablet by mouth daily.   Not Taking at Unknown time  . naproxen (NAPROSYN) 500 MG tablet Take 1 tablet (500 mg total) by mouth 2 (two) times daily with a meal. 30 tablet 0 06/13/2018 at Unknown time  . ondansetron (ZOFRAN ODT) 4 MG disintegrating tablet Take 1 tablet (4 mg total) by mouth  every 8 (eight) hours as needed for nausea or vomiting. 30 tablet 0 06/13/2018 at Unknown time  . phenazopyridine (PYRIDIUM) 200 MG tablet Take 1 tablet (200 mg total) by mouth 3 (three) times daily. (Patient not taking: Reported on 06/13/2018) 9 tablet 0 Not Taking at Unknown time  . predniSONE (DELTASONE) 20 MG tablet Take 1 tablet (20 mg total) by mouth daily with breakfast. (Patient not taking: Reported on 06/13/2018) 30 tablet 3 Not Taking at Unknown time  . Probiotic Product ([place]) Take by mouth daily.   06/13/2018 at Unknown time  . raNITIdine HCl (ZANTAC PO) Take 75 mg by mouth 2 (two) times daily.    06/13/2018 at Unknown time    Patient Stressors: Financial difficulties Marital or family  conflict Medication change or noncompliance Occupational concerns  Patient Strengths: Technical sales engineer for treatment/growth  Treatment Modalities: Medication Management, Group therapy, Case management,  1 to 1 session with clinician, Psychoeducation, Recreational therapy.   Physician Treatment Plan for Primary Diagnosis: MDD (major depressive disorder), severe ([place]) Long Term Goal(s): Improvement in symptoms so as ready for discharge Improvement in symptoms so as ready for discharge   Short Term Goals: Ability to identify changes in lifestyle to reduce recurrence of condition will improve Ability to verbalize feelings will improve Ability to disclose and discuss suicidal ideas Ability to demonstrate self-control will improve Ability to identify and develop effective coping behaviors will improve Ability to maintain clinical measurements within normal limits will improve Compliance with prescribed medications will improve Ability to identify changes in lifestyle to reduce recurrence of condition will improve Ability to verbalize feelings will improve Ability to disclose and discuss suicidal ideas Ability to demonstrate self-control will improve Ability to identify and develop effective coping behaviors will improve Ability to maintain clinical measurements within normal limits will improve Compliance with prescribed medications will improve  Medication Management: Evaluate patient's response, side effects, and tolerance of medication regimen.  Therapeutic Interventions: 1 to 1 sessions, Unit Group sessions and Medication administration.  Evaluation of Outcomes: Progressing  Physician Treatment Plan for Secondary Diagnosis: Principal Problem:   MDD (major depressive disorder), severe ([place]) Active Problems:   Ulcerative colitis ([place])  Long Term Goal(s): Improvement in symptoms so as ready for discharge Improvement in symptoms so as ready for discharge    Short Term Goals: Ability to identify changes in lifestyle to reduce recurrence of condition will improve Ability to verbalize feelings will improve Ability to disclose and discuss suicidal ideas Ability to demonstrate self-control will improve Ability to identify and develop effective coping behaviors will improve Ability to maintain clinical measurements within normal limits will improve Compliance with prescribed medications will improve Ability to identify changes in lifestyle to reduce recurrence of condition will improve Ability to verbalize feelings will improve Ability to disclose and discuss suicidal ideas Ability to demonstrate self-control will improve Ability to identify and develop effective coping behaviors will improve Ability to maintain clinical measurements within normal limits will improve Compliance with prescribed medications will improve     Medication Management: Evaluate patient's response, side effects, and tolerance of medication regimen.  Therapeutic Interventions: 1 to 1 sessions, Unit Group sessions and Medication administration.  Evaluation of Outcomes: Progressing   RN Treatment Plan for Primary Diagnosis: MDD (major depressive disorder), severe ([place]) Long Term Goal(s): Knowledge of disease and therapeutic regimen to maintain health will improve  Short Term Goals: Ability to identify and develop effective coping behaviors will improve and Compliance with prescribed  medications will improve  Medication Management: RN will administer medications as ordered by provider, will assess and evaluate patient's response and provide education to patient for prescribed medication. RN will report any adverse and/or side effects to prescribing provider.  Therapeutic Interventions: 1 on 1 counseling sessions, Psychoeducation, Medication administration, Evaluate responses to treatment, Monitor vital signs and CBGs as ordered, Perform/monitor CIWA, COWS, AIMS and Fall  Risk screenings as ordered, Perform wound care treatments as ordered.  Evaluation of Outcomes: Progressing   LCSW Treatment Plan for Primary Diagnosis: MDD (major depressive disorder), severe ([place]) Long Term Goal(s): Safe transition to appropriate next level of care at discharge, Engage patient in therapeutic group addressing interpersonal concerns.  Short Term Goals: Engage patient in aftercare planning with referrals and resources, Increase social support and Increase skills for wellness and recovery  Therapeutic Interventions: Assess for all discharge needs, 1 to 1 time with Social worker, Explore available resources and support systems, Assess for adequacy in community support network, Educate family and significant other(s) on suicide prevention, Complete Psychosocial Assessment, Interpersonal group therapy.  Evaluation of Outcomes: Progressing   Progress in Treatment: Attending groups: Yes. Participating in groups: Yes. Taking medication as prescribed: Yes. Toleration medication: Yes. Family/Significant other contact made: Yes, individual(s) contacted:  mother Patient understands diagnosis: Yes. Discussing patient identified problems/goals with staff: Yes. Medical problems stabilized or resolved: Yes. Denies suicidal/homicidal ideation: Yes. Issues/concerns per patient self-inventory: No. Other: none  New problem(s) identified: No, Describe:  none  New Short Term/Long Term Goal(s):  Patient Goals:  "Get on the right meds and get set up with a therapist and psychiatrist."  Discharge Plan or Barriers:   Reason for Continuation of Hospitalization: Depression Medication stabilization  Estimated Length of Stay: 1-3 days.  Attendees: Patient: 06/21/2018   Physician: Dr. Parke Poisson, MD 06/21/2018   Nursing: Neldon Newport, RN 06/21/2018   RN Care Manager: 06/21/2018  Social Worker: Lurline Idol, LCSW 06/21/2018   Recreational Therapist:  06/21/2018   Other:  06/21/2018   Other:  06/21/2018    Other: 06/21/2018        Scribe for Treatment Team: Joanne Chars, LCSW 06/21/2018 1:08 PM

## 2018-06-21 NOTE — Therapy (Signed)
Occupational Therapy Group Note  Date:  06/21/2018 Time:  3:14 PM  Group Topic/Focus:  Stress Management  Participation Level:  Active  Participation Quality:  Appropriate  Affect:  Blunted  Cognitive:  Appropriate  Insight: Improving  Engagement in Group:  Engaged  Modes of Intervention:  Activity, Discussion, Education and Socialization  Additional Comments:    S: "I hold my stress in too often and I will explode"  O: Education given on stress management and healthy coping mechanisms. Pt encouraged to brainstorm with other peers and discuss what has worked in the past vs what has not. Pts further encouraged to discuss new coping stress management strategies to implement this date. Art activity made to display preferred coping mechanisms, along with incorporating the stress management outlet of coloring/art. Gratitude journaling and coloring sheets given at end of session,  A: Pt presents to group with blunted affect, affect improved when discussing preferred activities. Pt with some self seeking behaviors throughout group, easily redirectable. Pt brainstormed activities with other peers, stating taking a bubble bath, watching movies, reading, and journaling are her preferred stress management activities. Pt engaged in art activity with success.   P: Pt provided with education on stress management activities to implement into daily routine. Handouts given to facilitate carryover when reintegrating into community   [facility], [place], OTR/L  [employer] 06/21/2018, 3:14 PM

## 2018-06-21 NOTE — Progress Notes (Signed)
Recreation Therapy Notes  Date: 9.4.19 Time: 0930 Location: [address] Dayroom  Group Topic: Stress Management  Goal Area(s) Addresses:  Patient will verbalize importance of using healthy stress management.  Patient will identify positive emotions associated with healthy stress management.   Intervention: Stress Management  Activity :  Guided Imagery.  LRT introduced the stress management technique of guided imagery.  LRT read a script that allowed patients to enjoy the summer clouds.  Patients were to follow along as LRT read script to engage in activity.  Education:  Stress Management, Discharge Planning.   Education Outcome: Acknowledges edcuation/In group clarification offered/Needs additional education  Clinical Observations/Feedback: Pt did not attend group.     Victorino Sparrow, LRT/CTRS         Ria Comment, Detta Mellin A 06/21/2018 11:03 AM

## 2018-06-22 ENCOUNTER — Telehealth: Payer: Self-pay | Admitting: Gastroenterology

## 2018-06-22 NOTE — Progress Notes (Signed)
Adult Psychoeducational Group Note  Date:  06/22/2018 Time:  9:01 PM  Group Topic/Focus:  Wrap-Up Group:   The focus of this group is to help patients review their daily goal of treatment and discuss progress on daily workbooks.  Participation Level:  Active  Participation Quality:  Appropriate  Affect:  Appropriate  Cognitive:  Appropriate  Insight: Appropriate  Engagement in Group:  Engaged  Modes of Intervention:  Discussion  Additional Comments:  Pt goal was to speak with Dr about the effects of meds and the goal was met. Pt rated the day at a 10/10.  Leonardo Makris 06/22/2018, 9:01 PM

## 2018-06-22 NOTE — Telephone Encounter (Signed)
The pt is admitted in the hospital and the pt's mother was advised she will need to speak to the nurses at the hospital and they will contact GI if needed.

## 2018-06-22 NOTE — Progress Notes (Addendum)
D: Patient states that the prednisone is making her feel "anxious."  Patient states that she started "hearing voices." She was informed that she will be tapered off of it again.  Patient rates her depression and hopelessness as an 8; anxiety as a 10.  She reports passive SI with no specific plan; she contracts for safety on the unit.  Patient is visible in the milieu; she is pleasant with staff.  Her goal today is to "get abilify dosage up so I can get my anxiety under control."  A: Continue to monitor medication management and MD orders.  Safety checks completed every 15 minutes per protocol.  Offer support and encouragement as needed.  R: Patient is receptive to staff; her behavior is appropriate.

## 2018-06-22 NOTE — Progress Notes (Signed)
[facility] MD Progress Note  06/22/2018 2:02 PM Sydney Deleon  MRN:  [id number]   Subjective: Patient reports some improvement but describes ongoing anxiety .  Presents somatically focused and describes feeling groggy, dizzy at times .  No syncopal symptoms .  She does endorse some improvement and states she feels better than she did yesterday, she is also noted to be more future oriented and focusing on discharge planning-  reports that she is hope she is able to be discharged home in the next couple of days.  However, she states that she wants to make sure that her medications arrived and she is ready to be discharged before she goes. Denies medication side effects -she does state that prednisone/steroid management has contributed to increased mood instability and anxiety and has expressed interest in discontinuing prednisone . Today denies suicidal ideations .   Objective: Patient seen, case  discussed withtreatment team.   Today presents alert, attentive, pleasant on approach .  Describes lingering anxiety and depression but acknowledges some improvement compared to admission .denies suicidal ideations at this time .  Noted to be more focused on discharge and overall more future oriented today . Describes vague feelings of dizziness/sedation but at this time is fully alert and attentive /gait is steady/vitals are stable  She is tolerating medications well.  She remains focused on discontinuing prednisone, but expresses improved understanding regarding the importance of tapering off prednisone rather than discontinuing it abruptly.  I have consulted with hospitalist who is recommendation, regarding steroid taper, is to decrease gradually over a period of 7 to 8 days.  She agrees to above.  Of note she states that prednisone was prescribed for inflammatory bowel disease, but states that GI symptoms have improved significantly and currently consist only of vague intermittent bloating (no diarrhea, no  abdominal pain, no GI bleeding). No disruptive or agitated behaviors on unit   Principal Problem: MDD (major depressive disorder), severe ([place]) Diagnosis:   Patient Active Problem List   Diagnosis Date Noted  . MDD (major depressive disorder), severe ([place]) [F32.2] 06/14/2018  . MDD (major depressive disorder), recurrent severe, without psychosis ([place]) [F33.2]   . Ulcerative colitis ([place]) [K51.90] 05/12/2018  . Diarrhea [R19.7] 04/26/2018  . Generalized abdominal pain [R10.84] 04/26/2018  . Nausea and vomiting [R11.2] 04/26/2018  . Depression [F32.9]   . Bipolar 1 disorder ([place]) [F31.9]   . Asthma [J45.909]   . Acid reflux [K21.9]    Total Time spent with patient: 20 minutes  Past Psychiatric History: See H&P  Past Medical History:  Past Medical History:  Diagnosis Date  . Acid reflux   . Allergy   . Anxiety   . Arthritis   . Asthma   . Bipolar 1 disorder ([place])   . Chronic bronchitis ([place])   . Depression   . Miscarriage within last 12 months     Past Surgical History:  Procedure Laterality Date  . TONSILECTOMY/ADENOIDECTOMY WITH MYRINGOTOMY     Family History:  Family History  Problem Relation Age of Onset  . Hypertension Mother   . Throat cancer Paternal Grandfather   . Colon cancer Cousin        2 cousins  . Breast cancer Cousin   . Rectal cancer Cousin   . Breast cancer Cousin   . Esophageal cancer Neg Hx   . Stomach cancer Neg Hx    Family Psychiatric  History: See H&P Social History:  Social History   Substance and Sexual Activity  Alcohol  Use No     Social History   Substance and Sexual Activity  Drug Use No    Social History   Socioeconomic History  . Marital status: Single    Spouse name: Not on file  . Number of children: Not on file  . Years of education: Not on file  . Highest education level: Not on file  Occupational History  . Not on file  Social Needs  . Financial resource strain: Not on file  . Food insecurity:    Worry: Not  on file    Inability: Not on file  . Transportation needs:    Medical: Not on file    Non-medical: Not on file  Tobacco Use  . Smoking status: Never Smoker  . Smokeless tobacco: Never Used  Substance and Sexual Activity  . Alcohol use: No  . Drug use: No  . Sexual activity: Yes    Birth control/protection: None    Comment: on period now  Lifestyle  . Physical activity:    Days per week: Not on file    Minutes per session: Not on file  . Stress: Not on file  Relationships  . Social connections:    Talks on phone: Not on file    Gets together: Not on file    Attends religious service: Not on file    Active member of club or organization: Not on file    Attends meetings of clubs or organizations: Not on file    Relationship status: Not on file  Other Topics Concern  . Not on file  Social History Narrative  . Not on file   Additional Social History:    Pain Medications: See MAR Prescriptions: See MAR Over the Counter: See MAR History of alcohol / drug use?: Yes Longest period of sobriety (when/how long): unknown Name of Substance 1: mariujuana 1 - Last Use / Amount: pt denies ever using.  She stated she has been around people who use marijuana.  Sleep: Good  Appetite:  Good  Current Medications: Current Facility-Administered Medications  Medication Dose Route Frequency Provider Last Rate Last Dose  . acetaminophen (TYLENOL) tablet 650 mg  650 mg Oral Q6H PRN Money, Lowry Ram, FNP   650 mg at 06/15/18 2127  . acidophilus (RISAQUAD) capsule 1 capsule  1 capsule Oral Daily Money, Lowry Ram, FNP   1 capsule at 06/22/18 8250  . albuterol (PROVENTIL HFA;VENTOLIN HFA) 108 (90 Base) MCG/ACT inhaler 2 puff  2 puff Inhalation Q4H PRN Money, Lowry Ram, FNP      . alum & mag hydroxide-simeth (MAALOX/MYLANTA) 200-200-20 MG/5ML suspension 30 mL  30 mL Oral Q4H PRN Money, Lowry Ram, FNP   30 mL at 06/17/18 2128  . ARIPiprazole (ABILIFY) tablet 2 mg  2 mg Oral Daily Cobos, Myer Peer, MD    2 mg at 06/22/18 0828  . citalopram (CELEXA) tablet 40 mg  40 mg Oral Daily Money, Lowry Ram, FNP   40 mg at 06/22/18 5397  . famotidine (PEPCID) tablet 20 mg  20 mg Oral Daily Money, Lowry Ram, FNP   20 mg at 06/22/18 6734  . hydrOXYzine (ATARAX/VISTARIL) tablet 25 mg  25 mg Oral TID PRN Money, Lowry Ram, FNP   25 mg at 06/19/18 2114  . magnesium hydroxide (MILK OF MAGNESIA) suspension 30 mL  30 mL Oral Daily PRN Money, Lowry Ram, FNP      . mesalamine (LIALDA) EC tablet 2.4 g  2,400 mg Oral Daily Green, Terri L,  RPH   2.4 g at 06/22/18 0828  . predniSONE (DELTASONE) tablet 10 mg  10 mg Oral Q breakfast Cobos, Myer Peer, MD   10 mg at 06/22/18 0828  . traZODone (DESYREL) tablet 50 mg  50 mg Oral QHS PRN Money, Lowry Ram, FNP   50 mg at 06/21/18 2119    Lab Results: No results found for this or any previous visit (from the past 48 hour(s)).  Blood Alcohol level:  Lab Results  Component Value Date   ETH <10 40/98/1191    Metabolic Disorder Labs: No results found for: HGBA1C, MPG No results found for: PROLACTIN No results found for: CHOL, TRIG, HDL, CHOLHDL, VLDL, LDLCALC  Physical Findings: AIMS: Facial and Oral Movements Muscles of Facial Expression: None, normal Lips and Perioral Area: None, normal Jaw: None, normal Tongue: None, normal,Extremity Movements Upper (arms, wrists, hands, fingers): None, normal Lower (legs, knees, ankles, toes): None, normal, Trunk Movements Neck, shoulders, hips: None, normal, Overall Severity Severity of abnormal movements (highest score from questions above): None, normal Incapacitation due to abnormal movements: None, normal Patient's awareness of abnormal movements (rate only patient's report): No Awareness, Dental Status Current problems with teeth and/or dentures?: No Does patient usually wear dentures?: No  CIWA:    COWS:     Musculoskeletal: Strength & Muscle Tone: within normal limits Gait & Station: normal Patient leans:  N/A  Psychiatric Specialty Exam: Physical Exam  Nursing note and vitals reviewed. Constitutional: She is oriented to person, place, and time. She appears well-developed and well-nourished.  Cardiovascular: Normal rate.  Respiratory: Effort normal.  Musculoskeletal: Normal range of motion.  Neurological: She is alert and oriented to person, place, and time.  Skin: Skin is warm.    Review of Systems  Constitutional: Negative.   HENT: Negative.   Eyes: Negative.   Respiratory: Negative.   Cardiovascular: Negative.   Gastrointestinal: Negative.   Genitourinary: Negative.   Musculoskeletal: Negative.   Skin: Negative.   Neurological: Negative.   Endo/Heme/Allergies: Negative.   Psychiatric/Behavioral: Positive for depression. Negative for suicidal ideas. The patient is nervous/anxious.   As above, no current abdominal pain or melanous  Blood pressure 123/85, pulse 87, temperature 98.1 F (36.7 C), temperature source Oral, resp. rate 16, height 5' 5"  (1.651 m), weight 103.4 kg, last menstrual period 05/25/2018.Body mass index is 37.94 kg/m.  General Appearance: Casual  Eye Contact:  Good  Speech:  Normal Rate  Volume:  Normal  Mood:  Improving, acknowledges feeling better than she did on admission  Affect:  Reactive, smiles at times appropriately, vaguely anxious  Thought Process:  Goal Directed and Descriptions of Associations: Intact  Orientation:  Full (Time, Place, and Person)  Thought Content: No hallucinations, no delusions  Suicidal Thoughts:  No-denies suicidal or self-injurious ideations, denies homicidal ideations, contracts for safety on unit  Homicidal Thoughts:  No  Memory:  Recent and remote grossly intact  Judgement:  Other:  Improving  Insight:  Fair and Improving  Psychomotor Activity:  Normal  Concentration:  Concentration: Good and Attention Span: Good  Recall:  Good  Fund of Knowledge:  Good  Language:  Good  Akathisia:  No  Handed:  Right  AIMS (if  indicated):     Assets:  Communication Skills Desire for Improvement Financial Resources/Insurance Housing Social Support Transportation  ADL's:  Intact  Cognition:  WNL  Sleep:  Number of Hours: 6.75   Assessment -27 year old female, presented for depression, suicidal ideations.  History of depression, anxiety.  History of ulcerative colitis diagnosed several weeks ago for which she was started on p.o. prednisone.  Patient has reported that steroid management has contributed to increased anxiety and emotional instability.  Currently presents with partial improvement/improving mood.  Does remain vaguely anxious, labile, somatically focused, but today presents calm, pleasant on approach, future oriented.  Tolerating medications well.  Expressing understanding regarding indication to taper off prednisone gradually rather than stop abruptly.    Treatment Plan Summary: Daily contact with patient to assess and evaluate symptoms and progress in treatment, Medication management and Plan is to:  Treatment plan reviewed as below today 9/5 -Continue Celexa 40 mg p.o. daily for depression -Continue Abilify 2 mg p.o. daily for mood disorder/antidepressant augmentation -Continue Trazodone 50 mg p.o. nightly as needed for insomnia -Continue Prednisone at 10 mg daily, proceed with taper.-Indication of steroid is for ulcerative colitis, which is currently reported as much improved. -Encourage group therapy participation to work on Radiographer, therapeutic and symptom reduction. -Treatment team working on disposition planning.  Gabriel Earing MD

## 2018-06-22 NOTE — Progress Notes (Addendum)
D:  Sydney Deleon was up and visible on the unit.  She denied SI/HI or A/V hallucinations.  She stated that she is feeling better this evening and no near fainting episodes noted.  She denied any pain or discomfort and appeared to be in no physical distress.  She requested trazodone for sleep with good relief.   A:  1:1 with RN for support and encouragement.  Medications as ordered.  Q 15 minute checks maintained for safety.  Encouraged participation in group and unit activities.   R:  Sydney Deleon remains safe on the unit.  We will continue to monitor the progress towards her goals.

## 2018-06-22 NOTE — Plan of Care (Signed)
  Problem: Education: Goal: Emotional status will improve Outcome: Not Progressing Goal: Mental status will improve Outcome: Not Progressing Goal: Verbalization of understanding the information provided will improve Outcome: Not Progressing   Problem: Activity: Goal: Interest or engagement in activities will improve Outcome: Not Progressing

## 2018-06-22 NOTE — Plan of Care (Signed)
  Problem: Activity: Goal: Interest or engagement in activities will improve Outcome: Not Progressing Note:  She continues to have intermittent group attendance.  We will continue to encourage participation in group and unit activities.

## 2018-06-23 MED ORDER — ARIPIPRAZOLE 5 MG PO TABS
5.0000 mg | ORAL_TABLET | Freq: Every day | ORAL | Status: DC
  Administered 2018-06-24 – 2018-06-26 (×3): 5 mg via ORAL
  Filled 2018-06-23: qty 1
  Filled 2018-06-23: qty 7
  Filled 2018-06-23 (×3): qty 1

## 2018-06-23 MED ORDER — PREDNISONE 5 MG PO TABS
5.0000 mg | ORAL_TABLET | Freq: Every day | ORAL | Status: DC
  Administered 2018-06-24 – 2018-06-26 (×3): 5 mg via ORAL
  Filled 2018-06-23: qty 1
  Filled 2018-06-23: qty 4
  Filled 2018-06-23 (×3): qty 1

## 2018-06-23 NOTE — Progress Notes (Signed)
Writer spoke with patient 1:1 and she is hoping to discharge on tomorrow. She reports that her mother came to visit her tonight. She is hoping to return to school once discharged. She has been up in the dayroom and attended group. She requested trazodone to aid with sleep tonight. Support given and safety maintained on unit with 15 min checks.

## 2018-06-23 NOTE — Progress Notes (Signed)
D: Patient is exhibiting some attention seeking behavior.  Patient was taking her pills "one at a time."  She is preoccupied with her medications and requested to know what each pill was, even after the nurse had explained each of them to her.  She denies any depressive symptoms or thoughts of self harm.  Her goal today is to "talk to doctor about my discharge plan." She is not sleeping well, however, her sleep time was recorded as 6 hours.  She rates her depression and as a 1; she denies any anxiety.  She is wanting to speak with MD and Education officer, museum.  Patient has poor insight regarding her predisone taper.  A: Continue to monitor medication management and MD orders.  Safety checks completed every 15 minutes per protocol.  Offer support and encouragement as needed.  R: Patient is receptive to staff; her behavior is appropriate.

## 2018-06-23 NOTE — Progress Notes (Signed)
Adult Psychoeducational Group Note  Date:  06/23/2018 Time:  9:43 AM  Group Topic/Focus:  Orientation:   The focus of this group is to educate the patient on the purpose and policies of crisis stabilization and provide a format to answer questions about their admission.  The group details unit policies and expectations of patients while admitted.  Participation Level:  Active  Participation Quality:  Appropriate  Affect:  Appropriate  Cognitive:  Alert  Insight: Appropriate  Engagement in Group:  Engaged  Modes of Intervention:  Discussion and Education  Additional Comments:     Lita Mains 06/23/2018, 9:43 AM
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
## 2018-06-23 NOTE — Plan of Care (Signed)
  Problem: Education: Goal: Emotional status will improve Outcome: Not Progressing Goal: Mental status will improve Outcome: Not Progressing   Patient feels that she is not doing well because she is still taking prednisone.  Patient has poor insight regarding her taper.  Patient has stated that the prednisone is what started her mental health problem with his hospitalization.

## 2018-06-23 NOTE — Progress Notes (Signed)
Adult Psychoeducational Group Note  Date:  06/23/2018 Time:  9:04 PM  Group Topic/Focus:  Wrap-Up Group:   The focus of this group is to help patients review their daily goal of treatment and discuss progress on daily workbooks.  Participation Level:  Active  Participation Quality:  Appropriate  Affect:  Appropriate  Cognitive:  Appropriate  Insight: Appropriate  Engagement in Group:  Engaged  Modes of Intervention:  Discussion  Additional Comments: The patient expressed that she attended group and is looking forward to discharge.  Nash Shearer 06/23/2018, 9:04 PM

## 2018-06-24 NOTE — [facility] Group Notes (Signed)
[facility] Group Notes:  (Nursing)  Date:  06/24/2018  Time:1:15 PM Type of Therapy:  Nurse Education  Participation Level:  Active  Participation Quality:  Appropriate  Affect:  Appropriate  Cognitive:  Appropriate  Insight:  Appropriate  Engagement in Group:  Engaged  Modes of Intervention:  Discussion and Education  Summary of Progress/Problems: Nurse-led Life Skills group: Identifying Needs  Sydney Deleon 06/24/2018, 2:33 PM

## 2018-06-24 NOTE — Progress Notes (Signed)
D. Pt reports an improving mood- cooperative and friendly during interactions- Pt reports that she's tired and wants to go back to school next week.  Per pt's self inventory, pt rates her depression, hopelessness and anxiety all 0's today. Pt writes that her most important goal today is "talk to doctor about a discharge plan". Pt currently denies SI/HI and AVHA. Labs and vitals monitored. Pt compliant with medications. Pt supported emotionally and encouraged to express concerns and ask questions.   R. Pt remains safe with 15 minute checks. Will continue POC.

## 2018-06-24 NOTE — [facility] Group Notes (Signed)
LCSW Group Therapy Note  06/24/2018   10:00-11:00am   Type of Therapy and Topic:  Group Therapy: Anger Cues and Responses  Participation Level:  Did Not Attend   Description of Group:   In this group, patients learned how to recognize the physical, cognitive, emotional, and behavioral responses they have to anger-provoking situations.  They identified a recent time they became angry and how they reacted.  They analyzed how their reaction was possibly beneficial and how it was possibly unhelpful.  The group discussed a variety of healthier coping skills that could help with such a situation in the future.  Deep breathing was practiced briefly.  Therapeutic Goals: 1. Patients will remember their last incident of anger and how they felt emotionally and physically, what their thoughts were at the time, and how they behaved. 2. Patients will identify how their behavior at that time worked for them, as well as how it worked against them. 3. Patients will explore possible new behaviors to use in future anger situations. 4. Patients will learn that anger itself is normal and cannot be eliminated, and that healthier reactions can assist with resolving conflict rather than worsening situations.  Summary of Patient Progress:  Did not attend Therapeutic Modalities:   Cognitive Behavioral Therapy  Rolanda Jay

## 2018-06-24 NOTE — Progress Notes (Signed)
[facility] MD Progress Note  06/24/2018 4:16 PM Sydney Deleon  MRN:  [id number]   Subjective: reports " I felt a little agitated yesterday, but I feel better now". She states she is feeling less depressed, calmer and less moody, which she attributes to Prednisone taper and to Abilify titration. Denies suicidal ideations and currently future oriented, presents more future oriented, wanting to discharge soon, in order to return to school- is currently completing her GED and plans to become CNA. Denies medication side effects.   Objective: Chart notes reviewed, patient seen. 27 year old, no children, currently homeless, lives in a church with her mother. Presented with suicidal ideations, depression, which she attributes to significant stressors, including homelessness . She also reports she feels prednisone trial was contributing to mood symptoms and increased anxiety.  She has been started on prednisone in July for diagnosis of inflammatory bowel disease/ulcerative colitis Patient reports that she is feeling better today, calmer, less anxious, less irritable. States " I am a lot calmer today". Attributes this mainly to recent mediation changes - Prednisone taper, Abilify titration. Staff notes indicate patient presenting with improving mood , cooperative on approach, future oriented. Denies suicidal ideations. Denies medication side effects- currently on Abilify , Celexa . Has been visible on unit, going to some groups, no disruptive or agitated behaviors on unit .   Principal Problem: MDD (major depressive disorder), severe ([place]) Diagnosis:   Patient Active Problem List   Diagnosis Date Noted  . MDD (major depressive disorder), severe ([place]) [F32.2] 06/14/2018  . MDD (major depressive disorder), recurrent severe, without psychosis ([place]) [F33.2]   . Ulcerative colitis ([place]) [K51.90] 05/12/2018  . Diarrhea [R19.7] 04/26/2018  . Generalized abdominal pain [R10.84] 04/26/2018  . Nausea and vomiting  [R11.2] 04/26/2018  . Depression [F32.9]   . Bipolar 1 disorder ([place]) [F31.9]   . Asthma [J45.909]   . Acid reflux [K21.9]    Total Time spent with patient: 20 minutes  Past Psychiatric History: Patient stated she is been treated for psychiatric illness for many years.  She actually is been fairly stable since 2011.  The only medication she is been treated with the Celexa.  Prior to that she had psychiatric hospitalizations and had been treated with antipsychotics, mood stabilizers as well as depression occasions.  Past Medical History:  Past Medical History:  Diagnosis Date  . Acid reflux   . Allergy   . Anxiety   . Arthritis   . Asthma   . Bipolar 1 disorder ([place])   . Chronic bronchitis ([place])   . Depression   . Miscarriage within last 12 months     Past Surgical History:  Procedure Laterality Date  . TONSILECTOMY/ADENOIDECTOMY WITH MYRINGOTOMY     Family History:  Family History  Problem Relation Age of Onset  . Hypertension Mother   . Throat cancer Paternal Grandfather   . Colon cancer Cousin        2 cousins  . Breast cancer Cousin   . Rectal cancer Cousin   . Breast cancer Cousin   . Esophageal cancer Neg Hx   . Stomach cancer Neg Hx    Family Psychiatric  History: See H&P Social History:  Social History   Substance and Sexual Activity  Alcohol Use No     Social History   Substance and Sexual Activity  Drug Use No    Social History   Socioeconomic History  . Marital status: Single    Spouse name: Not on file  .  Number of children: Not on file  . Years of education: Not on file  . Highest education level: Not on file  Occupational History  . Not on file  Social Needs  . Financial resource strain: Not on file  . Food insecurity:    Worry: Not on file    Inability: Not on file  . Transportation needs:    Medical: Not on file    Non-medical: Not on file  Tobacco Use  . Smoking status: Never Smoker  . Smokeless tobacco: Never Used  Substance  and Sexual Activity  . Alcohol use: No  . Drug use: No  . Sexual activity: Yes    Birth control/protection: None    Comment: on period now  Lifestyle  . Physical activity:    Days per week: Not on file    Minutes per session: Not on file  . Stress: Not on file  Relationships  . Social connections:    Talks on phone: Not on file    Gets together: Not on file    Attends religious service: Not on file    Active member of club or organization: Not on file    Attends meetings of clubs or organizations: Not on file    Relationship status: Not on file  Other Topics Concern  . Not on file  Social History Narrative  . Not on file   Additional Social History:    Pain Medications: See MAR Prescriptions: See MAR Over the Counter: See MAR History of alcohol / drug use?: Yes Longest period of sobriety (when/how long): unknown Name of Substance 1: mariujuana 1 - Last Use / Amount: pt denies ever using.  She stated she has been around people who use marijuana.  Sleep: Good  Appetite:  Good  Current Medications: Current Facility-Administered Medications  Medication Dose Route Frequency Provider Last Rate Last Dose  . acetaminophen (TYLENOL) tablet 650 mg  650 mg Oral Q6H PRN Money, Lowry Ram, FNP   650 mg at 06/15/18 2127  . acidophilus (RISAQUAD) capsule 1 capsule  1 capsule Oral Daily Money, Lowry Ram, FNP   1 capsule at 06/24/18 0829  . albuterol (PROVENTIL HFA;VENTOLIN HFA) 108 (90 Base) MCG/ACT inhaler 2 puff  2 puff Inhalation Q4H PRN Money, Lowry Ram, FNP      . alum & mag hydroxide-simeth (MAALOX/MYLANTA) 200-200-20 MG/5ML suspension 30 mL  30 mL Oral Q4H PRN Money, Lowry Ram, FNP   30 mL at 06/17/18 2128  . ARIPiprazole (ABILIFY) tablet 5 mg  5 mg Oral Daily Nanci Pina, FNP   5 mg at 06/24/18 0829  . citalopram (CELEXA) tablet 40 mg  40 mg Oral Daily Money, Lowry Ram, FNP   40 mg at 06/24/18 8341  . famotidine (PEPCID) tablet 20 mg  20 mg Oral Daily Money, Lowry Ram, FNP   20  mg at 06/24/18 9622  . hydrOXYzine (ATARAX/VISTARIL) tablet 25 mg  25 mg Oral TID PRN Money, Lowry Ram, FNP   25 mg at 06/19/18 2114  . magnesium hydroxide (MILK OF MAGNESIA) suspension 30 mL  30 mL Oral Daily PRN Money, Lowry Ram, FNP   30 mL at 06/23/18 2052  . mesalamine (LIALDA) EC tablet 2.4 g  2,400 mg Oral Daily Minda Ditto, RPH   2.4 g at 06/24/18 0829  . predniSONE (DELTASONE) tablet 5 mg  5 mg Oral Q breakfast Nanci Pina, FNP   5 mg at 06/24/18 0829  . traZODone (DESYREL) tablet 50 mg  50 mg Oral QHS PRN Money, Lowry Ram, FNP   50 mg at 06/23/18 2054    Lab Results: No results found for this or any previous visit (from the past 48 hour(s)).  Blood Alcohol level:  Lab Results  Component Value Date   ETH <10 40/98/1191    Metabolic Disorder Labs: No results found for: HGBA1C, MPG No results found for: PROLACTIN No results found for: CHOL, TRIG, HDL, CHOLHDL, VLDL, LDLCALC  Physical Findings: AIMS: Facial and Oral Movements Muscles of Facial Expression: None, normal Lips and Perioral Area: None, normal Jaw: None, normal Tongue: None, normal,Extremity Movements Upper (arms, wrists, hands, fingers): None, normal Lower (legs, knees, ankles, toes): None, normal, Trunk Movements Neck, shoulders, hips: None, normal, Overall Severity Severity of abnormal movements (highest score from questions above): None, normal Incapacitation due to abnormal movements: None, normal Patient's awareness of abnormal movements (rate only patient's report): No Awareness, Dental Status Current problems with teeth and/or dentures?: No Does patient usually wear dentures?: No  CIWA:    COWS:     Musculoskeletal: Strength & Muscle Tone: within normal limits Gait & Station: normal Patient leans: N/A  Psychiatric Specialty Exam: Physical Exam  Nursing note and vitals reviewed. Constitutional: She is oriented to person, place, and time. She appears well-developed and well-nourished.   Cardiovascular: Normal rate.  Respiratory: Effort normal.  Musculoskeletal: Normal range of motion.  Neurological: She is alert and oriented to person, place, and time.  Skin: Skin is warm.    Review of Systems  Constitutional: Negative.   HENT: Negative.   Eyes: Negative.   Respiratory: Negative.   Cardiovascular: Negative.   Gastrointestinal: Negative.   Genitourinary: Negative.   Musculoskeletal: Negative.   Skin: Negative.   Neurological: Negative.   Endo/Heme/Allergies: Negative.   Psychiatric/Behavioral: Positive for depression. Negative for suicidal ideas. The patient is nervous/anxious.   No significant abdominal pain, no melenas, no diarrhea  Blood pressure 112/80, pulse 83, temperature 98.3 F (36.8 C), temperature source Oral, resp. rate 16, height 5' 5"  (1.651 m), weight 103.4 kg, last menstrual period 05/25/2018.Body mass index is 37.94 kg/m.  General Appearance: Well Groomed  Eye Contact:  Good  Speech:  Normal Rate  Volume:  Normal  Mood:  improving mood   Affect:  calm, more reactive, fuller in range   Thought Process:  Goal Directed and Descriptions of Associations: Intact  Orientation:  Full (Time, Place, and Person)  Thought Content: No hallucinations, no delusions  Suicidal Thoughts:  No-denies suicidal or self-injurious ideations, denies homicidal ideations, contracts for safety on unit  Homicidal Thoughts:  denies homicidal or violent ideations  Memory:  Recent and remote grossly intact  Judgement:  Other:  Improving  Insight:  Fair and Improving  Psychomotor Activity:  Normal  Concentration:  Concentration: Good and Attention Span: Good  Recall:  Good  Fund of Knowledge:  Good  Language:  Good  Akathisia:  No  Handed:  Right  AIMS (if indicated):     Assets:  Communication Skills Desire for Improvement Financial Resources/Insurance Housing Social Support Transportation  ADL's:  Intact  Cognition:  WNL  Sleep:  Number of Hours: 6.25    Assessment -27 year old female, presented for depression, suicidal ideations.  History of depression, anxiety.  History of ulcerative colitis diagnosed several weeks ago for which she was started on p.o. prednisone.  Patient has reported that steroid management has contributed to increased anxiety and emotional instability. Today patient presents with improving mood and range of affect .  States she is feeling calmer , less agitated, and less moody which she attributes to Prednisone taper, which she has tolerated well thus far, without any symptoms or worsening abdominal/GI symptoms thus far . Denies medication side effects, no suicidal ideations . As she improves she is becoming more future oriented, more focused on discharge planning    Treatment Plan Summary: Daily contact with patient to assess and evaluate symptoms and progress in treatment, Medication management and Plan is to:  Treatment plan reviewed as below today 9/7 -Continue Celexa 40 mg p.o. daily for depression - Continue Abilify 5 mg p.o. daily for mood disorder/antidepressant augmentation -Continue Trazodone 50 mg p.o. nightly as needed for insomnia - Continue  Prednisone  5 mg daily x 4 days, proceed with taper-indication of steroid is for ulcerative colitis, which is currently reported as much improved. -Encourage group therapy participation to work on Radiographer, therapeutic and symptom reduction. -Treatment team working on disposition planning.  Patient ID: [name], female   DOB: 31-Jan-1991, 27 y.o.   MRN: [id number]

## 2018-06-25 NOTE — Progress Notes (Signed)
Adult Psychoeducational Group Note  Date:  06/25/2018 Time:  10:05 PM  Group Topic/Focus:  Wrap-Up Group:   The focus of this group is to help patients review their daily goal of treatment and discuss progress on daily workbooks.  Participation Level:  Active  Participation Quality:  Appropriate and Attentive  Affect:  Appropriate  Cognitive:  Alert, Appropriate and Oriented  Insight: Appropriate  Engagement in Group:  Engaged  Modes of Intervention:  Discussion and Education  Additional Comments:  Pt attended and participated in group. Pt stated her goal today was to go to groups and get some rest. Pt reported completing her goal and rated her day a 10/10. Pt's goal tomorrow will be to discharge.   Milus Glazier 06/25/2018, 10:05 PM

## 2018-06-25 NOTE — Progress Notes (Addendum)
[facility] MD Progress Note  06/25/2018 12:54 PM Sydney Deleon  MRN:  [id number]   Subjective: I am great.  I cannot even say been I am doing better and I am actually doing great right now.  I am ready to go.  Mom onset is different.  And I just found out that I am discharging tomorrow.  We have made huge strides since being in the hospital to include medication and initiate of the right medication and titration off prednisone. Denies medication side effects.   Objective: Chart notes reviewed, patient seen. 27 year old, no children, currently homeless, lives in a church with her mother. Presented with suicidal ideations, depression, which she attributes to significant stressors, including homelessness .  Throughout the admission patient has continued to educate herself on her mental conditions, medication, and improvement in her mood.  She is seeing very active in group, and therapeutic milieu.  She states her goal for today is to attend all groups and get some rest.  She is able to sleep and eat better, and denied any disturbances and eating or sleeping.Staff notes indicate patient presenting with improving mood , cooperative on approach, future oriented. Denies suicidal ideations.Denies medication side effects- currently on Abilify , Celexa . Has been visible on unit, going to some groups, no disruptive or agitated behaviors on unit .   Principal Problem: MDD (major depressive disorder), severe ([place]) Diagnosis:   Patient Active Problem List   Diagnosis Date Noted  . MDD (major depressive disorder), severe ([place]) [F32.2] 06/14/2018  . MDD (major depressive disorder), recurrent severe, without psychosis ([place]) [F33.2]   . Ulcerative colitis ([place]) [K51.90] 05/12/2018  . Diarrhea [R19.7] 04/26/2018  . Generalized abdominal pain [R10.84] 04/26/2018  . Nausea and vomiting [R11.2] 04/26/2018  . Depression [F32.9]   . Bipolar 1 disorder ([place]) [F31.9]   . Asthma [J45.909]   . Acid reflux [K21.9]     Total Time spent with patient: 20 minutes  Past Psychiatric History: Patient stated she is been treated for psychiatric illness for many years.  She actually is been fairly stable since 2011.  The only medication she is been treated with the Celexa.  Prior to that she had psychiatric hospitalizations and had been treated with antipsychotics, mood stabilizers as well as depression occasions.  Past Medical History:  Past Medical History:  Diagnosis Date  . Acid reflux   . Allergy   . Anxiety   . Arthritis   . Asthma   . Bipolar 1 disorder ([place])   . Chronic bronchitis ([place])   . Depression   . Miscarriage within last 12 months     Past Surgical History:  Procedure Laterality Date  . TONSILECTOMY/ADENOIDECTOMY WITH MYRINGOTOMY     Family History:  Family History  Problem Relation Age of Onset  . Hypertension Mother   . Throat cancer Paternal Grandfather   . Colon cancer Cousin        2 cousins  . Breast cancer Cousin   . Rectal cancer Cousin   . Breast cancer Cousin   . Esophageal cancer Neg Hx   . Stomach cancer Neg Hx    Family Psychiatric  History: See H&P Social History:  Social History   Substance and Sexual Activity  Alcohol Use No     Social History   Substance and Sexual Activity  Drug Use No    Social History   Socioeconomic History  . Marital status: Single    Spouse name: Not on file  .  Number of children: Not on file  . Years of education: Not on file  . Highest education level: Not on file  Occupational History  . Not on file  Social Needs  . Financial resource strain: Not on file  . Food insecurity:    Worry: Not on file    Inability: Not on file  . Transportation needs:    Medical: Not on file    Non-medical: Not on file  Tobacco Use  . Smoking status: Never Smoker  . Smokeless tobacco: Never Used  Substance and Sexual Activity  . Alcohol use: No  . Drug use: No  . Sexual activity: Yes    Birth control/protection: None    Comment:  on period now  Lifestyle  . Physical activity:    Days per week: Not on file    Minutes per session: Not on file  . Stress: Not on file  Relationships  . Social connections:    Talks on phone: Not on file    Gets together: Not on file    Attends religious service: Not on file    Active member of club or organization: Not on file    Attends meetings of clubs or organizations: Not on file    Relationship status: Not on file  Other Topics Concern  . Not on file  Social History Narrative  . Not on file   Additional Social History:    Pain Medications: See MAR Prescriptions: See MAR Over the Counter: See MAR History of alcohol / drug use?: Yes Longest period of sobriety (when/how long): unknown Name of Substance 1: mariujuana 1 - Last Use / Amount: pt denies ever using.  She stated she has been around people who use marijuana.  Sleep: Good  Appetite:  Good  Current Medications: Current Facility-Administered Medications  Medication Dose Route Frequency Provider Last Rate Last Dose  . acetaminophen (TYLENOL) tablet 650 mg  650 mg Oral Q6H PRN Money, Lowry Ram, FNP   650 mg at 06/15/18 2127  . acidophilus (RISAQUAD) capsule 1 capsule  1 capsule Oral Daily Money, Lowry Ram, FNP   1 capsule at 06/25/18 0809  . albuterol (PROVENTIL HFA;VENTOLIN HFA) 108 (90 Base) MCG/ACT inhaler 2 puff  2 puff Inhalation Q4H PRN Money, Lowry Ram, FNP      . alum & mag hydroxide-simeth (MAALOX/MYLANTA) 200-200-20 MG/5ML suspension 30 mL  30 mL Oral Q4H PRN Money, Lowry Ram, FNP   30 mL at 06/17/18 2128  . ARIPiprazole (ABILIFY) tablet 5 mg  5 mg Oral Daily Nanci Pina, FNP   5 mg at 06/25/18 0300  . citalopram (CELEXA) tablet 40 mg  40 mg Oral Daily Money, Lowry Ram, FNP   40 mg at 06/25/18 9233  . famotidine (PEPCID) tablet 20 mg  20 mg Oral Daily Money, Lowry Ram, FNP   20 mg at 06/25/18 0809  . hydrOXYzine (ATARAX/VISTARIL) tablet 25 mg  25 mg Oral TID PRN Money, Lowry Ram, FNP   25 mg at 06/19/18  2114  . magnesium hydroxide (MILK OF MAGNESIA) suspension 30 mL  30 mL Oral Daily PRN Money, Lowry Ram, FNP   30 mL at 06/23/18 2052  . mesalamine (LIALDA) EC tablet 2.4 g  2,400 mg Oral Daily Minda Ditto, RPH   2.4 g at 06/25/18 0809  . predniSONE (DELTASONE) tablet 5 mg  5 mg Oral Q breakfast Nanci Pina, FNP   5 mg at 06/25/18 0809  . traZODone (DESYREL) tablet 50 mg  50 mg Oral QHS PRN Money, Lowry Ram, FNP   50 mg at 06/24/18 2226    Lab Results: No results found for this or any previous visit (from the past 48 hour(s)).  Blood Alcohol level:  Lab Results  Component Value Date   ETH <10 29/19/1660    Metabolic Disorder Labs: No results found for: HGBA1C, MPG No results found for: PROLACTIN No results found for: CHOL, TRIG, HDL, CHOLHDL, VLDL, LDLCALC  Physical Findings: AIMS: Facial and Oral Movements Muscles of Facial Expression: None, normal Lips and Perioral Area: None, normal Jaw: None, normal Tongue: None, normal,Extremity Movements Upper (arms, wrists, hands, fingers): None, normal Lower (legs, knees, ankles, toes): None, normal, Trunk Movements Neck, shoulders, hips: None, normal, Overall Severity Severity of abnormal movements (highest score from questions above): None, normal Incapacitation due to abnormal movements: None, normal Patient's awareness of abnormal movements (rate only patient's report): No Awareness, Dental Status Current problems with teeth and/or dentures?: No Does patient usually wear dentures?: No  CIWA:    COWS:     Musculoskeletal: Strength & Muscle Tone: within normal limits Gait & Station: normal Patient leans: N/A  Psychiatric Specialty Exam: Physical Exam  Nursing note and vitals reviewed. Constitutional: She is oriented to person, place, and time. She appears well-developed and well-nourished.  Cardiovascular: Normal rate.  Respiratory: Effort normal.  Musculoskeletal: Normal range of motion.  Neurological: She is alert and  oriented to person, place, and time.  Skin: Skin is warm.    Review of Systems  Constitutional: Negative.   HENT: Negative.   Eyes: Negative.   Respiratory: Negative.   Cardiovascular: Negative.   Gastrointestinal: Negative.   Genitourinary: Negative.   Musculoskeletal: Negative.   Skin: Negative.   Neurological: Negative.   Endo/Heme/Allergies: Negative.   Psychiatric/Behavioral: Positive for depression. Negative for suicidal ideas. The patient is nervous/anxious.   No significant abdominal pain, no melenas, no diarrhea  Blood pressure 112/78, pulse 90, temperature 98.6 F (37 C), temperature source Oral, resp. rate 16, height 5' 5"  (1.651 m), weight 103.4 kg, last menstrual period 11/14/2017.Body mass index is 37.94 kg/m.  General Appearance: Well Groomed  Eye Contact:  Good  Speech:  Normal Rate  Volume:  Normal  Mood:  improving mood   Affect:  calm, more reactive, fuller in range   Thought Process:  Goal Directed and Descriptions of Associations: Intact  Orientation:  Full (Time, Place, and Person)  Thought Content: No hallucinations, no delusions  Suicidal Thoughts:  No-denies suicidal or self-injurious ideations, denies homicidal ideations, contracts for safety on unit  Homicidal Thoughts:  denies homicidal or violent ideations  Memory:  Recent and remote grossly intact  Judgement:  Other:  Improving  Insight:  Fair and Improving  Psychomotor Activity:  Normal  Concentration:  Concentration: Good and Attention Span: Good  Recall:  Good  Fund of Knowledge:  Good  Language:  Good  Akathisia:  No  Handed:  Right  AIMS (if indicated):     Assets:  Communication Skills Desire for Improvement Financial Resources/Insurance Housing Social Support Transportation  ADL's:  Intact  Cognition:  WNL  Sleep:  Number of Hours: 6.25   Assessment -27 year old female, presented for depression, suicidal ideations.  History of depression, anxiety.  History of ulcerative colitis  diagnosed several weeks ago for which she was started on p.o. prednisone.  Patient has reported that steroid management has contributed to increased anxiety and emotional instability. Today patient presents with improving mood and range of affect .  States she is feeling calmer , less agitated, and less moody which she attributes to Prednisone taper, which she has tolerated well thus far, without any symptoms or worsening abdominal/GI symptoms thus far . Denies medication side effects, no suicidal ideations . As she improves she is becoming more future oriented, more focused on discharge planning    Treatment Plan Summary: Daily contact with patient to assess and evaluate symptoms and progress in treatment, Medication management and Plan is to:  Treatment plan reviewed as below today 9/7 -Continue Celexa 40 mg p.o. daily for depression - Continue Abilify 5 mg p.o. daily for mood disorder/antidepressant augmentation -Continue Trazodone 50 mg p.o. nightly as needed for insomnia - Continue  Prednisone  5 mg daily x 4 days, proceed with taper-indication of steroid is for ulcerative colitis, which is currently reported as much improved. -Encourage group therapy participation to work on Radiographer, therapeutic and symptom reduction. -Treatment team working on disposition planning.  Patient ID: [name], female   DOB: 03-01-1991, 27 y.o.   MRN: [id number] .Marland KitchenAgree with NP Progress Note

## 2018-06-25 NOTE — Progress Notes (Signed)
Pt presents with a flat affect and anxious mood. Pt denies any depression and anxiety today. Pt denies SI/HI. Pt reported fair sleep last night. Pt expressed that she's ready for discharge and plans to meet her mother and go stay at a church until their house is ready. Pt compliant with taking meds and denies any side effects. No concerns verbalized by pt.   Medications administered as ordered per MD. Verbal support provided. Pt encouraged to attend groups. 15 minute checks performed for safety.   Pt compliant with tx plan.

## 2018-06-25 NOTE — [facility] Group Notes (Signed)
[facility] LCSW Group Therapy Note  Date/Time:  06/25/2018 9:00-10:00 or 10:00-11:00AM  Type of Therapy and Topic:  Group Therapy:  Healthy and Unhealthy Supports  Participation Level:  Active   Description of Group:  Patients in this group were introduced to the idea of adding a variety of healthy supports to address the various needs in their lives.Patients discussed what additional healthy supports could be helpful in their recovery and wellness after discharge in order to prevent future hospitalizations.   An emphasis was placed on using counselor, doctor, therapy groups, 12-step groups, and problem-specific support groups to expand supports.  They also worked as a group on developing a specific plan for several patients to deal with unhealthy supports through [place], psychoeducation with loved ones, and even termination of relationships.   Therapeutic Goals:   1)  discuss importance of adding supports to stay well once out of the hospital  2)  compare healthy versus unhealthy supports and identify some examples of each  3)  generate ideas and descriptions of healthy supports that can be added  4)  offer mutual support about how to address unhealthy supports  5)  encourage active participation in and adherence to discharge plan    Summary of Patient Progress:  The patient stated that current healthy supports in her life are her family and church family while current unhealthy supports include a group of peers she smoked and drank with. She now recognizes that they were good her well-being and influenced her to engage in activities that were bad for her mental health. The patient expressed a willingness to add [facility]'s therapist and Psychiatrist  as supports to help in her recovery journey.   Therapeutic Modalities:   Motivational Interviewing Brief Solution-Focused Therapy  Rolanda Jay

## 2018-06-25 NOTE — Progress Notes (Signed)
Writer has observed patient up in the dayroom watching tv and coloring. She rperots that she has talked to the doctor and plans to discharge on Monday so that she can attend school. Support given and safety maintained on unit.

## 2018-06-26 MED ORDER — TRAZODONE HCL 50 MG PO TABS: 50.0000 mg | ORAL_TABLET | Freq: Every evening | ORAL | 0 refills | Status: DC | PRN

## 2018-06-26 MED ORDER — MESALAMINE 800 MG PO TBEC: 2400.0000 mg | DELAYED_RELEASE_TABLET | Freq: Every day | ORAL | 0 refills | Status: DC

## 2018-06-26 MED ORDER — PREDNISONE 5 MG PO TABS: 5.0000 mg | ORAL_TABLET | Freq: Every day | ORAL | 0 refills | Status: DC

## 2018-06-26 MED ORDER — ARIPIPRAZOLE 5 MG PO TABS: 5.0000 mg | ORAL_TABLET | Freq: Every day | ORAL | 0 refills | Status: DC

## 2018-06-26 MED ORDER — HYDROXYZINE HCL 25 MG PO TABS: 25.0000 mg | ORAL_TABLET | Freq: Three times a day (TID) | ORAL | 0 refills | Status: DC | PRN

## 2018-06-26 MED ORDER — CITALOPRAM HYDROBROMIDE 40 MG PO TABS: 40.0000 mg | ORAL_TABLET | Freq: Every day | ORAL | 0 refills | Status: DC

## 2018-06-26 NOTE — [facility] Suicide Risk Assessment (Signed)
[facility] Discharge Suicide Risk Assessment   Principal Problem: MDD (major depressive disorder), severe [facility]) Discharge Diagnoses:  Patient Active Problem List   Diagnosis Date Noted  . MDD (major depressive disorder), severe ([place]) [F32.2] 06/14/2018  . MDD (major depressive disorder), recurrent severe, without psychosis ([place]) [F33.2]   . Ulcerative colitis ([place]) [K51.90] 05/12/2018  . Diarrhea [R19.7] 04/26/2018  . Generalized abdominal pain [R10.84] 04/26/2018  . Nausea and vomiting [R11.2] 04/26/2018  . Depression [F32.9]   . Bipolar 1 disorder ([place]) [F31.9]   . Asthma [J45.909]   . Acid reflux [K21.9]     Total Time spent with patient: 30 minutes  Musculoskeletal: Strength & Muscle Tone: within normal limits Gait & Station: normal Patient leans: N/A  Psychiatric Specialty Exam: ROS no headache, no chest pain, no shortness of breath, no vomiting, no diarrhea, no melenas , no fever  Blood pressure 120/87, pulse 95, temperature 98.3 F (36.8 C), temperature source Oral, resp. rate 16, height 5' 5"  (1.651 m), weight 103.4 kg, last menstrual period 11/14/2017.Body mass index is 37.94 kg/m.  General Appearance: Well Groomed  Eye Contact::  Good  Speech:  Normal Rate409  Volume:  Normal  Mood:  noticeably improved, presents euthymic at this time  Affect:  Appropriate and more reactive, currently not irritable   Thought Process:  Linear and Descriptions of Associations: Intact  Orientation:  Full (Time, Place, and Person)  Thought Content:  no hallucinations, no delusions, not internally preoccupied   Suicidal Thoughts:  No denies suicidal or self injurious ideations, no homicidal or violent ideations  Homicidal Thoughts:  No  Memory:  recent and remote grossly intact   Judgement:  Other:  improving   Insight:  improving   Psychomotor Activity:  Normal  Concentration:  Good  Recall:  Good  Fund of Knowledge:Good  Language: Good  Akathisia:  Negative  Handed:  Right  AIMS  (if indicated):     Assets:  Desire for Improvement Resilience  Sleep:  Number of Hours: 6.75  Cognition: WNL  ADL's:  Intact   Mental Status Per Nursing Assessment::   On Admission:  Suicidal ideation indicated by patient, Suicide plan  Demographic Factors:  26, single, homeless.  Loss Factors: Homelessness, recently diagnosed with Ulcerative Colitis , reported mood and anxiety symptoms related to steroid management  Historical Factors: History of prior psychiatric admission for depression, last time 2011. History of prior suicide attempts  Risk Reduction Factors:   Sense of responsibility to family and Positive coping skills or problem solving skills  Continued Clinical Symptoms:  At this time patient improved- presents alert, attentive, with improved, currently euthymic, affect appropriate and fuller in range, not currently irritable or agitated. No thought disorder, no SI or HI, no psychotic symptoms. Has tolerated Steroid ( prednisone) taper well, without side effects or any reactivation of her Inflammatory Bowel Disease- currently denies significant GI symptoms, and denies diarrhea, abdominal pain, or any GI bleeding. She is now down to 5 mgrs QDAY of Prednisone . Behavior on unit in good control, pleasant on approach. Denies medication side effects.  Cognitive Features That Contribute To Risk:  No gross cognitive deficits noted upon discharge. Is alert , attentive, and oriented x 3  Suicide Risk:  Mild:  Suicidal ideation of limited frequency, intensity, duration, and specificity.  There are no identifiable plans, no associated intent, mild dysphoria and related symptoms, good self-control (both objective and subjective assessment), few other risk factors, and identifiable protective factors, including available and accessible social  support.  Follow-up Information    [facility]. [facility] on 06/23/2018.   Specialty:  Behavioral Health Why:  Please attend your appt on Friday, 06/23/18,  at 8:15am. Contact information: [place] [address] [phone]           Plan Of Care/Follow-up recommendations:  Activity:  as tolerated  Diet:  diet for inflammatory bowel disease  Tests:  NA Other:  See below  Patient is expressing readiness for discharge . She is planning to return to the [place] she is currently living at with her mother, states they are on waiting list for housing. She has an identified GI  ( Dr. Darylene Price) at [facility] for ongoing management, has appt on 9/18 at 2,30 PM  Jenne Campus, MD 06/26/2018, 8:00 AM

## 2018-06-26 NOTE — Progress Notes (Signed)
Recreation Therapy Notes  Date: 9.9.19 Time: 0930 Location: [address] Dayroom  Group Topic: Stress Management  Goal Area(s) Addresses:  Patient will verbalize importance of using healthy stress management.  Patient will identify positive emotions associated with healthy stress management.   Intervention: Stress Management  Activity :  [employer].  LRT introduced the stress management technique of meditation.  Patients were to listen as meditation played to engage in the activity.  Education: Stress Management, Discharge Planning.   Education Outcome: Acknowledges edcuation/In group clarification offered/Needs additional education  Clinical Observations/Feedback: Pt did not attend group.    Victorino Sparrow, LRT/CTRS        Victorino Sparrow A 06/26/2018 12:15 PM

## 2018-06-26 NOTE — Plan of Care (Signed)
  Problem: Education: Goal: Knowledge of [facility] Education information/materials will improve Outcome: Completed/Met Goal: Emotional status will improve Outcome: Completed/Met Goal: Mental status will improve Outcome: Completed/Met Goal: Verbalization of understanding the information provided will improve Outcome: Completed/Met   Problem: Activity: Goal: Interest or engagement in activities will improve Outcome: Completed/Met Goal: Sleeping patterns will improve Outcome: Completed/Met   Problem: Coping: Goal: Ability to verbalize frustrations and anger appropriately will improve Outcome: Completed/Met Goal: Ability to demonstrate self-control will improve Outcome: Completed/Met   Problem: Health Behavior/Discharge Planning: Goal: Identification of resources available to assist in meeting health care needs will improve Outcome: Completed/Met Goal: Compliance with treatment plan for underlying cause of condition will improve Outcome: Completed/Met   Problem: Physical Regulation: Goal: Ability to maintain clinical measurements within normal limits will improve Outcome: Completed/Met   Problem: Safety: Goal: Periods of time without injury will increase Outcome: Completed/Met   Problem: Education: Goal: Utilization of techniques to improve thought processes will improve Outcome: Completed/Met Goal: Knowledge of the prescribed therapeutic regimen will improve Outcome: Completed/Met   Problem: Activity: Goal: Interest or engagement in leisure activities will improve Outcome: Completed/Met Goal: Imbalance in normal sleep/wake cycle will improve Outcome: Completed/Met   Problem: Coping: Goal: Coping ability will improve Outcome: Completed/Met Goal: Will verbalize feelings Outcome: Completed/Met   Problem: Health Behavior/Discharge Planning: Goal: Ability to make decisions will improve Outcome: Completed/Met Goal: Compliance with therapeutic regimen will  improve Outcome: Completed/Met   Problem: Role Relationship: Goal: Will demonstrate positive changes in social behaviors and relationships Outcome: Completed/Met   Problem: Safety: Goal: Ability to disclose and discuss suicidal ideas will improve Outcome: Completed/Met Goal: Ability to identify and utilize support systems that promote safety will improve Outcome: Completed/Met   Problem: Self-Concept: Goal: Will verbalize positive feelings about self Outcome: Completed/Met Goal: Level of anxiety will decrease Outcome: Completed/Met   Problem: Education: Goal: Ability to make informed decisions regarding treatment will improve Outcome: Completed/Met   Problem: Coping: Goal: Coping ability will improve Outcome: Completed/Met   Problem: Health Behavior/Discharge Planning: Goal: Identification of resources available to assist in meeting health care needs will improve Outcome: Completed/Met   Problem: Medication: Goal: Compliance with prescribed medication regimen will improve Outcome: Completed/Met   Problem: Self-Concept: Goal: Ability to disclose and discuss suicidal ideas will improve Outcome: Completed/Met Goal: Will verbalize positive feelings about self Outcome: Completed/Met

## 2018-06-26 NOTE — Progress Notes (Signed)
Patient ID: Sydney Deleon, female   DOB: Sep 08, 1991, 27 y.o.   MRN: [id number]  Discharge Note  D) Patient discharged to lobby. Patient states readiness for discharge. Patient denies SI/HI, AVH and is not delusional or psychotic. Patient in no acute distress.   A) Written and verbal discharge instructions given to the patient. Patient accepting to information and verbalized understanding. Patient agrees to the discharge plan. Opportunity for questions and concerns presented to patient. Patient denied any further questions or concerns. All belongings returned to patient. Patient signed for return of belongings and discharge paperwork.   R) Patient safely escorted to the lobby. Patient discharged from [facility] with medication samples, prescriptions, personal belongings, follow-up appointment in place and discharge paperwork.

## 2018-06-26 NOTE — Progress Notes (Signed)
  [facility] Adult Case Management Discharge Plan :  Will you be returning to the same living situation after discharge:  No. Will be staying with mother at a local church, per pt. At discharge, do you have transportation home?: Yes,  mother Do you have the ability to pay for your medications: No. Will work with [employer].  Release of information consent forms completed and in the chart;  Patient's signature needed at discharge.  Patient to Follow up at: Follow-up Information    [facility] on 06/28/2018.   Specialty:  Behavioral Health Why:  Please attend your appt on Wednesday, 06/28/18, at 8:00am. Contact information: [place] [address] [phone]           Next level of care provider has access to [facility] and Suicide Prevention discussed: Yes,  with mother  Have you used any form of tobacco in the last 30 days? (Cigarettes, Smokeless Tobacco, Cigars, and/or Pipes): No  Has patient been referred to the Quitline?: N/A patient is not a smoker  Patient has been referred for addiction treatment: Yes  Joanne Chars, [place] 06/26/2018, 12:57 PM

## 2018-06-26 NOTE — Progress Notes (Signed)
D: Patient discharging today.  Her mother is picking her up this afternoon.  She denies any thoughts of self harm.  She denies any depressive symptoms.  Patient can be attention seeking at times.  She is looking [place] the church until her mother's home is finished.    A: Continue to monitor medication management and MD orders.  Safety checks completed every 15 minutes per protocol.  Offer support and encouragement as needed.  R: Patient is receptive to staff; her behavior is appropriate.
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
## 2018-06-26 NOTE — Progress Notes (Signed)
Patient has been up and active on the unit, attended group this evening and has voiced no complaints. Patient currently denies having pain, -si/hi/a/v hall. Support and encouragement offered, safety maintained on unit, will continue to monitor.

## 2018-07-05 ENCOUNTER — Ambulatory Visit (INDEPENDENT_AMBULATORY_CARE_PROVIDER_SITE_OTHER): Payer: Self-pay | Admitting: Gastroenterology

## 2018-07-05 ENCOUNTER — Encounter: Payer: Self-pay | Admitting: Gastroenterology

## 2018-07-05 VITALS — BP 118/74 | HR 72 | Ht 63.25 in | Wt 234.2 lb

## 2018-07-05 DIAGNOSIS — K5100 Ulcerative (chronic) pancolitis without complications: Secondary | ICD-10-CM

## 2018-07-05 MED ORDER — SULFASALAZINE 500 MG PO TABS: ORAL_TABLET | ORAL | 2 refills | Status: DC

## 2018-07-05 NOTE — Patient Instructions (Addendum)
We sent a prescription to [facility] for Sulfasalazine 500 mg.  You can get folic acid capsules or tablets over the counter, take 2 mg daily.   We made you an appointment for 08-10-2018 at 1:30 PM.   Normal BMI (Body Mass Index- based on height and weight) is between 19 and 25. Your BMI today is Body mass index is 41.17 kg/m. Marland Kitchen Please consider follow up  regarding your BMI with your Primary Care Provider.

## 2018-07-05 NOTE — Progress Notes (Signed)
07/05/2018 Sydney Deleon [id number] 02-27-1991   HISTORY OF PRESENT ILLNESS:  This is a 27 year old female with newly diagnosed moderate to severe pancolitis.  Seemed to improve significantly on prednisone 20 mg daily but unfortunately she developed psychosis related to the prednisone and ended up admitted to [facility].  She still has not been able to get mesalamine because of significant cost burden.  Is still doing well but has only been off of prednisone for 5 days.  Having one BM per day that is usually solid.  No blood in stools and no abdominal pain.    Past Medical History:  Diagnosis Date  . Acid reflux   . Allergy   . Anxiety   . Arthritis   . Asthma   . Bipolar 1 disorder ([place])   . Chronic bronchitis ([place])   . Depression   . Miscarriage within last 12 months    Past Surgical History:  Procedure Laterality Date  . TONSILECTOMY/ADENOIDECTOMY WITH MYRINGOTOMY      reports that she has never smoked. She has never used smokeless tobacco. She reports that she does not drink alcohol or use drugs. family history includes Breast cancer in her cousin and cousin; Colon cancer in her cousin; Hypertension in her mother; Rectal cancer in her cousin; Throat cancer in her paternal grandfather. Allergies  Allergen Reactions  . Prednisone Other (See Comments)    Suicidal thoughts, tremors, face paralysis, homicidal thoughts,caused mental health issues      Outpatient Encounter Medications as of 07/05/2018  Medication Sig  . albuterol (PROVENTIL HFA;VENTOLIN HFA) 108 (90 Base) MCG/ACT inhaler Inhale 2 puffs into the lungs every 4 (four) hours as needed for wheezing or shortness of breath.  . ARIPiprazole (ABILIFY) 5 MG tablet Take 1 tablet (5 mg total) by mouth daily. For mood control  . citalopram (CELEXA) 40 MG tablet Take 1 tablet (40 mg total) by mouth daily. For mood control  . hydrOXYzine (ATARAX/VISTARIL) 25 MG tablet Take 1 tablet (25 mg total) by mouth 3 (three) times daily  as needed for anxiety.  . Mesalamine 800 MG TBEC Take 3 tablets (2,400 mg total) by mouth daily.  . Probiotic Product ([place]) Take by mouth daily.  . raNITIdine HCl (ZANTAC PO) Take 75 mg by mouth 2 (two) times daily.   . traZODone (DESYREL) 50 MG tablet Take 1 tablet (50 mg total) by mouth at bedtime as needed for sleep.  . [DISCONTINUED] predniSONE (DELTASONE) 5 MG tablet Take 1 tablet (5 mg total) by mouth daily with breakfast.   No facility-administered encounter medications on file as of 07/05/2018.      REVIEW OF SYSTEMS  : All other systems reviewed and negative except where noted in the History of Present Illness.   PHYSICAL EXAM: BP 118/74 (BP Location: Left Arm, Patient Position: Sitting, Cuff Size: Large)   Pulse 72   Ht 5' 3.25" (1.607 m) Comment: height measured without shoes  Wt 234 lb 4 oz (106.3 kg)   LMP 06/25/2018   BMI 41.17 kg/m  General: Well developed black female in no acute distress Head: Normocephalic and atraumatic Eyes:  Sclerae anicteric, conjunctiva pink. Ears: Normal auditory acuity Lungs: Clear throughout to auscultation; no increased WOB Heart: Regular rate and rhythm; no M/R/G. Abdomen: Soft, non-distended.  BS present.  Non-tender. Musculoskeletal: Symmetrical with no gross deformities  Skin: No lesions on visible extremities Extremities: No edema  Neurological: Alert oriented x 4, grossly non-focal Psychological:  Alert and cooperative. Normal mood and affect  ASSESSMENT AND PLAN: *27 year old female with newly diagnosed moderate to severe pancolitis.  Seemed to improve significantly on prednisone 20 mg daily but unfortunately she developed psychosis related to the prednisone and ended up admitted to [facility].  She still has not been able to get mesalamine because of significant cost burden.  Is still doing well but has only been off of prednisone for 5 days.  Will start sulfasalazine 1 gram BID as she is able to pay only $14 for  this with [facility].  Will have her begin 2 mg of folic acid daily with the medication as well.  Will see her back in 4-6 weeks for follow-up and will check CBC and hepatic function panel at that time.   CC:  No ref. provider found

## 2018-07-06 NOTE — Progress Notes (Signed)
They have already tried and they haven't had any luck.  It has been too long and she needs some type of medication.

## 2018-07-06 NOTE — Progress Notes (Signed)
There should be affordable mesalamine with Medicaid. [place]? The nurses can investigate. Thanks

## 2018-07-07 NOTE — Progress Notes (Signed)
Called pharmacy and pt has [facility] and the best choice for her is the sulfasalazine as she only has to pay 10.00.

## 2018-08-10 ENCOUNTER — Ambulatory Visit: Payer: Self-pay | Admitting: Gastroenterology

## 2018-08-19 ENCOUNTER — Emergency Department (HOSPITAL_COMMUNITY)
Admission: EM | Admit: 2018-08-19 | Discharge: 2018-08-19 | Disposition: A | Discharge status: Home / Self Care | Payer: Medicaid Other | Attending: Emergency Medicine | Admitting: Emergency Medicine

## 2018-08-19 ENCOUNTER — Encounter (HOSPITAL_COMMUNITY): Payer: Self-pay | Admitting: Emergency Medicine

## 2018-08-19 DIAGNOSIS — J45909 Unspecified asthma, uncomplicated: Secondary | ICD-10-CM | POA: Insufficient documentation

## 2018-08-19 DIAGNOSIS — R69 Illness, unspecified: Secondary | ICD-10-CM

## 2018-08-19 DIAGNOSIS — Z79899 Other long term (current) drug therapy: Secondary | ICD-10-CM | POA: Insufficient documentation

## 2018-08-19 DIAGNOSIS — Z590 Homelessness: Secondary | ICD-10-CM | POA: Insufficient documentation

## 2018-08-19 DIAGNOSIS — J111 Influenza due to unidentified influenza virus with other respiratory manifestations: Secondary | ICD-10-CM | POA: Insufficient documentation

## 2018-08-19 LAB — URINALYSIS, ROUTINE W REFLEX MICROSCOPIC
Bacteria, UA: NONE SEEN
Bilirubin Urine: NEGATIVE
Glucose, UA: NEGATIVE mg/dL
KETONES UR: NEGATIVE mg/dL
Nitrite: NEGATIVE
PH: 6 (ref 5.0–8.0)
Protein, ur: NEGATIVE mg/dL
SPECIFIC GRAVITY, URINE: 1.015 (ref 1.005–1.030)

## 2018-08-19 LAB — CBC
HCT: 34.2 % — ABNORMAL LOW (ref 36.0–46.0)
Hemoglobin: 10.8 g/dL — ABNORMAL LOW (ref 12.0–15.0)
MCH: 27.9 pg (ref 26.0–34.0)
MCHC: 31.6 g/dL (ref 30.0–36.0)
MCV: 88.4 fL (ref 80.0–100.0)
PLATELETS: 300 10*3/uL (ref 150–400)
RBC: 3.87 MIL/uL (ref 3.87–5.11)
RDW: 15.3 % (ref 11.5–15.5)
WBC: 7.4 10*3/uL (ref 4.0–10.5)
nRBC: 0 % (ref 0.0–0.2)

## 2018-08-19 LAB — BASIC METABOLIC PANEL
Anion gap: 3 — ABNORMAL LOW (ref 5–15)
BUN: 15 mg/dL (ref 6–20)
CALCIUM: 8.8 mg/dL — AB (ref 8.9–10.3)
CO2: 22 mmol/L (ref 22–32)
Chloride: 111 mmol/L (ref 98–111)
Creatinine, Ser: 0.86 mg/dL (ref 0.44–1.00)
Glucose, Bld: 87 mg/dL (ref 70–99)
POTASSIUM: 4.4 mmol/L (ref 3.5–5.1)
SODIUM: 136 mmol/L (ref 135–145)

## 2018-08-19 MED ORDER — ACETAMINOPHEN 325 MG PO TABS
650.0000 mg | ORAL_TABLET | Freq: Once | ORAL | Status: AC
  Administered 2018-08-19: 650 mg via ORAL
  Filled 2018-08-19: qty 2

## 2018-08-19 NOTE — ED Triage Notes (Signed)
Pt presents to ED for assessment of sudden onset of fever since waking this morning and generalized body aches.  Patient feels slightly warm to touch, wearing multiple layers.  Afebrile orally in triage.  Patient denies cough, congestion, denies urinary symptoms, denies source of infection.  Denies known exposure to virus.

## 2018-08-19 NOTE — ED Provider Notes (Signed)
[facility] EMERGENCY DEPARTMENT Provider Note   CSN: [id number] Arrival date & time: 08/19/18  1307     History   Chief Complaint Chief Complaint  Patient presents with  . Fever    HPI Sydney Deleon is a 27 y.o. female.  27 year old female brought in by EMS from a homeless shelter for feeling cold today with body aches.  Patient states that the shelter they slept in the last night was very cold and she woke up with chills, has been unable to get warm since that time.  Patient states that she has had a mild nonproductive cough with sinus congestion for the last couple days.  Denies ear pain, sore throat, wheezing or shortness of breath.  Patient has not taken anything for her chills or body aches today.  Patient has a history of Crohn's disease, managed by GI, not on DMARD.     Past Medical History:  Diagnosis Date  . Acid reflux   . Allergy   . Anxiety   . Arthritis   . Asthma   . Bipolar 1 disorder ([place])   . Chronic bronchitis ([place])   . Depression   . Miscarriage within last 12 months     Patient Active Problem List   Diagnosis Date Noted  . MDD (major depressive disorder), severe ([place]) 06/14/2018  . MDD (major depressive disorder), recurrent severe, without psychosis ([place])   . Ulcerative colitis ([place]) 05/12/2018  . Diarrhea 04/26/2018  . Generalized abdominal pain 04/26/2018  . Nausea and vomiting 04/26/2018  . Depression   . Bipolar 1 disorder ([place])   . Asthma   . Acid reflux     Past Surgical History:  Procedure Laterality Date  . TONSILECTOMY/ADENOIDECTOMY WITH MYRINGOTOMY       OB History    Gravida  2   Para      Term      Preterm      AB      Living        SAB      TAB      Ectopic      Multiple      Live Births               Home Medications    Prior to Admission medications   Medication Sig Start Date End Date Taking? Authorizing Provider  albuterol (PROVENTIL HFA;VENTOLIN HFA) 108 (90 Base)  MCG/ACT inhaler Inhale 2 puffs into the lungs every 4 (four) hours as needed for wheezing or shortness of breath.    [provider]  ARIPiprazole (ABILIFY) 5 MG tablet Take 1 tablet (5 mg total) by mouth daily. For mood control 06/26/18   Money, Lowry Ram, FNP  citalopram (CELEXA) 40 MG tablet Take 1 tablet (40 mg total) by mouth daily. For mood control 06/26/18   Money, Lowry Ram, FNP  hydrOXYzine (ATARAX/VISTARIL) 25 MG tablet Take 1 tablet (25 mg total) by mouth 3 (three) times daily as needed for anxiety. 06/26/18   Money, Lowry Ram, FNP  Probiotic Product ([place]) Take by mouth daily.    [provider]  raNITIdine HCl (ZANTAC PO) Take 75 mg by mouth 2 (two) times daily.     [provider]  sulfaSALAzine (AZULFIDINE) 500 MG tablet Take 2 tablets by mouth twice daily with food. 07/05/18   Zehr, Laban Emperor, PA-C  traZODone (DESYREL) 50 MG tablet Take 1 tablet (50 mg total) by mouth at bedtime as needed  for sleep. 06/26/18   Money, Lowry Ram, FNP    Family History Family History  Problem Relation Age of Onset  . Hypertension Mother   . Throat cancer Paternal Grandfather   . Colon cancer Cousin        2 cousins  . Breast cancer Cousin   . Rectal cancer Cousin   . Breast cancer Cousin   . Esophageal cancer Neg Hx   . Stomach cancer Neg Hx     Social History Social History   Tobacco Use  . Smoking status: Never Smoker  . Smokeless tobacco: Never Used  Substance Use Topics  . Alcohol use: No  . Drug use: No     Allergies   Prednisone   Review of Systems Review of Systems  Constitutional: Positive for chills. Negative for diaphoresis.  HENT: Positive for congestion. Negative for ear pain, sinus pressure, sinus pain and sore throat.   Respiratory: Positive for cough. Negative for shortness of breath and wheezing.   Cardiovascular: Negative for chest pain.  Gastrointestinal: Negative for abdominal pain, constipation, diarrhea, nausea and  vomiting.  Genitourinary: Negative for difficulty urinating, dysuria, frequency and urgency.  Musculoskeletal: Positive for arthralgias. Negative for joint swelling.  Skin: Negative for rash and wound.  Allergic/Immunologic: Positive for immunocompromised state.  Hematological: Negative for adenopathy.  Psychiatric/Behavioral: Negative for confusion.  All other systems reviewed and are negative.    Physical Exam Updated Vital Signs BP 112/64 (BP Location: Left Arm)   Pulse 91   Temp 98.6 F (37 C) (Oral)   Resp 20   LMP 11/14/2017   SpO2 98%   Physical Exam  Constitutional: She is oriented to person, place, and time. She appears well-developed and well-nourished. No distress.  HENT:  Head: Normocephalic and atraumatic.  Right Ear: External ear normal.  Left Ear: External ear normal.  Nose: Nose normal.  Mouth/Throat: Oropharynx is clear and moist. No oropharyngeal exudate.  Eyes: Conjunctivae are normal.  Neck: Neck supple.  Cardiovascular: Normal rate, regular rhythm, normal heart sounds and intact distal pulses.  No murmur heard. Pulmonary/Chest: Effort normal and breath sounds normal. No respiratory distress.  Abdominal: Soft. There is no tenderness.  Lymphadenopathy:    She has no cervical adenopathy.  Neurological: She is alert and oriented to person, place, and time.  Skin: Skin is warm and dry. No rash noted. She is not diaphoretic. No erythema.  Psychiatric: She has a normal mood and affect. Her behavior is normal.  Nursing note and vitals reviewed.    ED Treatments / Results  Labs (all labs ordered are listed, but only abnormal results are displayed) Labs Reviewed  CBC - Abnormal; Notable for the following components:      Result Value   Hemoglobin 10.8 (*)    HCT 34.2 (*)    All other components within normal limits  BASIC METABOLIC PANEL - Abnormal; Notable for the following components:   Calcium 8.8 (*)    Anion gap 3 (*)    All other components  within normal limits  URINALYSIS, ROUTINE W REFLEX MICROSCOPIC - Abnormal; Notable for the following components:   Hgb urine dipstick MODERATE (*)    Leukocytes, UA TRACE (*)    All other components within normal limits    EKG None  Radiology No results found.  Procedures Procedures (including critical care time)  Medications Ordered in ED Medications  acetaminophen (TYLENOL) tablet 650 mg (has no administration in time range)     Initial Impression /  Assessment and Plan / ED Course  I have reviewed the triage vital signs and the nursing notes.  Pertinent labs & imaging results that were available during my care of the patient were reviewed by me and considered in my medical decision making (see chart for details).  Clinical Course as of Aug 20 1511  Sat Aug 20, 1487  5160 27 year old female with complaint of chills and body aches.  Reports nonproductive cough and congestion for the past few days, slept in a homeless shelter with her mother last night and woke up this morning feeling very cold (notable change in the weather last night and patient states that shelter is not well heated).  On exam patient feels uncomfortably cold otherwise not ill-appearing, not febrile, repeat temperature in the room oral temp is 98.2.  Urinalysis with moderate hemoglobin, trace leukocytes, no white cells no bacteria.  CBC with evidence of patient's baseline anemia, BMP without significant changes.  Discussed with patient possible viral illness such as the flu with report of nonproductive cough and congestion now with body aches and chills although patient is afebrile.  Patient is not on DMARD for her ulcerative colitis.  Patient has not taking anything for her symptoms, patient was given Tylenol and advised to continue with same as well as symptomatic treatment for her mild cold symptoms.  Advised to return to ER for worsening or concerning symptoms otherwise follow-up with PCP.  Patient requests a note  for shelter to give her an extra blanket tonight, note provided.   [LM]    Clinical Course User Index [LM] Tacy Learn, PA-C   Final Clinical Impressions(s) / ED Diagnoses   Final diagnoses:  Influenza-like illness    ED Discharge Orders    None       Tacy Learn, PA-C 08/19/18 1513    Quintella Reichert, MD 08/20/18 [phone]

## 2018-08-19 NOTE — Discharge Instructions (Signed)
Take Tylenol as directed. Use OTC medications for symptom relief such as Zyrtec, Flonase, cough medication. Follow-up with primary care provider, referral given if needed.  Return to ER for worsening or concerning symptoms.

## 2018-08-22 ENCOUNTER — Ambulatory Visit: Payer: Self-pay | Admitting: Gastroenterology

## 2018-08-25 NOTE — [facility] Nurse Program (Signed)
  Dept: [facility] Nurse Program Note  Date of Encounter: 08/25/2018  Past Medical History: Past Medical History:  Diagnosis Date  . Acid reflux   . Allergy   . Anxiety   . Arthritis   . Asthma   . Bipolar 1 disorder ([place])   . Chronic bronchitis ([place])   . Depression   . Miscarriage within last 12 months     Encounter Details: CNP Questionnaire - 08/25/18 1334      Questionnaire   Patient Status  Not Applicable    Race  Black or African American    Location Patient Served At  [place]    Uninsured  Not Applicable    Food  No food insecurities    Housing/Utilities  No permanent housing    Transportation  No transportation needs    Interpersonal Safety  Yes, feel physically and emotionally safe where you currently live    Medication  Provided medication assistance    Medical Provider  Yes    Referrals  Primary Care Provider/Clinic    ED Visit Averted  Not Applicable    Life-Saving Intervention Made  Not Applicable      Client wanted her blood pressure checked and I was able to do this for her. Also she wants to talk with Social  Worker about her disability. I left a message for Vickie and the SW whom would be in later. [place] CN  Phd. [phone].Marland Kitchen

## 2018-08-28 ENCOUNTER — Telehealth: Payer: Self-pay | Admitting: Gastroenterology

## 2018-08-28 NOTE — Telephone Encounter (Signed)
Pt's mother Neoma Laming is requesting a letter that states that pt cannot go outside after meals due to her condition. Pt's mother stated that pt lives in a shelter and they make her go outside after every meal and she is concerned about her health. Please mail letter to address on file. Call Mrs. Grandville Silos for more information.

## 2018-08-28 NOTE — Telephone Encounter (Signed)
The pt NO SHOWED for last appt.  She wants a letter stating she can not go outside.  She was scheduled for 11/13 with Janett Billow

## 2018-08-29 ENCOUNTER — Encounter: Payer: Self-pay | Admitting: Pediatric Intensive Care

## 2018-08-30 ENCOUNTER — Ambulatory Visit: Payer: Self-pay | Admitting: Gastroenterology

## 2018-08-30 ENCOUNTER — Encounter: Payer: Self-pay | Admitting: Gastroenterology

## 2018-08-30 ENCOUNTER — Other Ambulatory Visit (INDEPENDENT_AMBULATORY_CARE_PROVIDER_SITE_OTHER): Payer: PRIVATE HEALTH INSURANCE

## 2018-08-30 ENCOUNTER — Ambulatory Visit (INDEPENDENT_AMBULATORY_CARE_PROVIDER_SITE_OTHER): Payer: PRIVATE HEALTH INSURANCE | Admitting: Gastroenterology

## 2018-08-30 VITALS — BP 120/66 | HR 80 | Ht 65.0 in | Wt 253.0 lb

## 2018-08-30 DIAGNOSIS — K51819 Other ulcerative colitis with unspecified complications: Secondary | ICD-10-CM

## 2018-08-30 LAB — CBC WITH DIFFERENTIAL/PLATELET
BASOS PCT: 0.8 % (ref 0.0–3.0)
Basophils Absolute: 0 10*3/uL (ref 0.0–0.1)
EOS ABS: 0.2 10*3/uL (ref 0.0–0.7)
EOS PCT: 3.7 % (ref 0.0–5.0)
HCT: 33.9 % — ABNORMAL LOW (ref 36.0–46.0)
HEMOGLOBIN: 11.3 g/dL — AB (ref 12.0–15.0)
Lymphocytes Relative: 38.1 % (ref 12.0–46.0)
Lymphs Abs: 2.1 10*3/uL (ref 0.7–4.0)
MCHC: 33.3 g/dL (ref 30.0–36.0)
MCV: 87.6 fl (ref 78.0–100.0)
MONO ABS: 0.5 10*3/uL (ref 0.1–1.0)
Monocytes Relative: 8.1 % (ref 3.0–12.0)
NEUTROS PCT: 49.3 % (ref 43.0–77.0)
Neutro Abs: 2.8 10*3/uL (ref 1.4–7.7)
PLATELETS: 365 10*3/uL (ref 150.0–400.0)
RBC: 3.87 Mil/uL (ref 3.87–5.11)
RDW: 16.5 % — AB (ref 11.5–15.5)
WBC: 5.6 10*3/uL (ref 4.0–10.5)

## 2018-08-30 LAB — COMPREHENSIVE METABOLIC PANEL
ALBUMIN: 4 g/dL (ref 3.5–5.2)
ALT: 9 U/L (ref 0–35)
AST: 12 U/L (ref 0–37)
Alkaline Phosphatase: 45 U/L (ref 39–117)
BUN: 15 mg/dL (ref 6–23)
CHLORIDE: 107 meq/L (ref 96–112)
CO2: 27 mEq/L (ref 19–32)
CREATININE: 0.79 mg/dL (ref 0.40–1.20)
Calcium: 9.2 mg/dL (ref 8.4–10.5)
GFR: 110.57 mL/min (ref >60.00)
GLUCOSE: 83 mg/dL (ref 70–99)
POTASSIUM: 4.2 meq/L (ref 3.5–5.1)
SODIUM: 139 meq/L (ref 135–145)
Total Bilirubin: 0.2 mg/dL (ref 0.2–1.2)
Total Protein: 7.5 g/dL (ref 6.0–8.3)

## 2018-08-30 NOTE — Progress Notes (Signed)
Assessment and plan reviewed 

## 2018-08-30 NOTE — Progress Notes (Signed)
08/30/2018 Sydney Deleon [id number] 1991/02/23   HISTORY OF PRESENT ILLNESS: This is a 27 year old female with newly diagnosed moderate to severe pancolitis.  She had initially been put placed on prednisone, but developed severe depression and psychosis and had to be admitted to behavioral health.  Prednisone was subsequently tapered quickly.  At her last visit on September 18 I placed her on sulfasalazine 1 g by mouth twice daily with at least 2 g of folic acid daily as well.  Says that she is actually taking 4 mcg of folic acid daily.  This has been the only thing that she is able to afford.  She has been taking the medication and so far is doing well.  She admits to about 2 bowel movements per day that she says are solid.  Denies any significant abdominal pain.  Says that she had a little bit of rectal bleeding the other day, but nothing significant or nothing that lasted.  Has gained some weight.   Past Medical History:  Diagnosis Date  . Acid reflux   . Allergy   . Anxiety   . Arthritis   . Asthma   . Bipolar 1 disorder ([place])   . Chronic bronchitis ([place])   . Depression   . Miscarriage within last 12 months    Past Surgical History:  Procedure Laterality Date  . TONSILECTOMY/ADENOIDECTOMY WITH MYRINGOTOMY      reports that she has never smoked. She has never used smokeless tobacco. She reports that she does not drink alcohol or use drugs. family history includes Breast cancer in her cousin and cousin; Colon cancer in her cousin; Hypertension in her mother; Rectal cancer in her cousin; Throat cancer in her paternal grandfather. Allergies  Allergen Reactions  . Prednisone Other (See Comments)    Suicidal thoughts, tremors, face paralysis, homicidal thoughts,caused mental health issues      Outpatient Encounter Medications as of 08/30/2018  Medication Sig  . albuterol (PROVENTIL HFA;VENTOLIN HFA) 108 (90 Base) MCG/ACT inhaler Inhale 2 puffs into the lungs every 4  (four) hours as needed for wheezing or shortness of breath.  . ARIPiprazole (ABILIFY) 5 MG tablet Take 1 tablet (5 mg total) by mouth daily. For mood control  . citalopram (CELEXA) 40 MG tablet Take 1 tablet (40 mg total) by mouth daily. For mood control  . hydrOXYzine (ATARAX/VISTARIL) 25 MG tablet Take 1 tablet (25 mg total) by mouth 3 (three) times daily as needed for anxiety.  . Probiotic Product ([place]) Take by mouth daily.  . raNITIdine HCl (ZANTAC PO) Take 75 mg by mouth 2 (two) times daily.   Marland Kitchen sulfaSALAzine (AZULFIDINE) 500 MG tablet Take 2 tablets by mouth twice daily with food.  . traZODone (DESYREL) 50 MG tablet Take 1 tablet (50 mg total) by mouth at bedtime as needed for sleep.   No facility-administered encounter medications on file as of 08/30/2018.      REVIEW OF SYSTEMS  : All other systems reviewed and negative except where noted in the History of Present Illness.   PHYSICAL EXAM: BP 120/66   Pulse 80   Ht 5' 5"  (1.651 m)   Wt 253 lb (114.8 kg)   LMP 08/26/2018 (Approximate)   BMI 42.10 kg/m  General: Well developed black female in no acute distress Head: Normocephalic and atraumatic Eyes:  Sclerae anicteric, conjunctiva pink. Ears: Normal auditory acuity Lungs: Clear throughout to auscultation; no increased WOB. Heart: Regular rate and rhythm; no M/R/G.  Abdomen: Soft, non-distended.  BS present.  Non-tender. Musculoskeletal: Symmetrical with no gross deformities  Skin: No lesions on visible extremities Extremities: No edema  Neurological: Alert oriented x 4, grossly non-focal Psychological:  Alert and cooperative. Normal mood and affect  ASSESSMENT AND PLAN: *27 year old female with newly diagnosed moderate to severe pancolitis.  Doing well so far on sulfasalazine 1 gram BID (this is the only thing that she is able to afford).  Taking folic acid 4 mg daily.  Will check and CBC and CMP today.  If labs ok then will continue medication and  return in 6 months at which time she will need repeat labs.   CC:  No ref. provider found

## 2018-08-30 NOTE — Patient Instructions (Signed)
Your provider has requested that you go to the basement level for lab work before leaving today. Press "B" on the elevator. The lab is located at the first door on the left as you exit the elevator.  We gave you a letter today to take to the [facility] for early entry.

## 2018-09-04 ENCOUNTER — Telehealth: Payer: Self-pay | Admitting: Gastroenterology

## 2018-09-04 NOTE — Telephone Encounter (Signed)
I advised the pt's mother and the pt that there is nothing further that we can do in regards to the shelter not allowing her to enter during the day

## 2018-09-04 NOTE — Telephone Encounter (Signed)
Pt's mother called stating that shelter still does not let pt in despite letter. Pls call her.

## 2018-09-08 ENCOUNTER — Encounter: Payer: Self-pay | Admitting: Pediatric Intensive Care

## 2018-09-09 ENCOUNTER — Emergency Department (HOSPITAL_COMMUNITY): Payer: PRIVATE HEALTH INSURANCE

## 2018-09-09 ENCOUNTER — Encounter (HOSPITAL_COMMUNITY): Payer: Self-pay | Admitting: Emergency Medicine

## 2018-09-09 ENCOUNTER — Emergency Department (HOSPITAL_COMMUNITY)
Admission: EM | Admit: 2018-09-09 | Discharge: 2018-09-09 | Disposition: A | Discharge status: Home / Self Care | Payer: PRIVATE HEALTH INSURANCE | Attending: Emergency Medicine | Admitting: Emergency Medicine

## 2018-09-09 DIAGNOSIS — B9789 Other viral agents as the cause of diseases classified elsewhere: Secondary | ICD-10-CM

## 2018-09-09 DIAGNOSIS — Z79899 Other long term (current) drug therapy: Secondary | ICD-10-CM | POA: Insufficient documentation

## 2018-09-09 DIAGNOSIS — J069 Acute upper respiratory infection, unspecified: Secondary | ICD-10-CM | POA: Diagnosis not present

## 2018-09-09 DIAGNOSIS — J45909 Unspecified asthma, uncomplicated: Secondary | ICD-10-CM | POA: Insufficient documentation

## 2018-09-09 DIAGNOSIS — F319 Bipolar disorder, unspecified: Secondary | ICD-10-CM | POA: Diagnosis not present

## 2018-09-09 DIAGNOSIS — J029 Acute pharyngitis, unspecified: Secondary | ICD-10-CM | POA: Diagnosis present

## 2018-09-09 DIAGNOSIS — F419 Anxiety disorder, unspecified: Secondary | ICD-10-CM | POA: Diagnosis not present

## 2018-09-09 LAB — GROUP A STREP BY PCR: GROUP A STREP BY PCR: NOT DETECTED

## 2018-09-09 MED ORDER — IBUPROFEN 400 MG PO TABS
600.0000 mg | ORAL_TABLET | Freq: Once | ORAL | Status: AC
  Administered 2018-09-09: 600 mg via ORAL
  Filled 2018-09-09: qty 1

## 2018-09-09 MED ORDER — IPRATROPIUM-ALBUTEROL 0.5-2.5 (3) MG/3ML IN SOLN
3.0000 mL | Freq: Once | RESPIRATORY_TRACT | Status: AC
  Administered 2018-09-09: 3 mL via RESPIRATORY_TRACT
  Filled 2018-09-09: qty 3

## 2018-09-09 MED ORDER — BENZONATATE 100 MG PO CAPS
200.0000 mg | ORAL_CAPSULE | Freq: Once | ORAL | Status: AC
  Administered 2018-09-09: 200 mg via ORAL
  Filled 2018-09-09: qty 2

## 2018-09-09 MED ORDER — BENZONATATE 100 MG PO CAPS: 200.0000 mg | ORAL_CAPSULE | Freq: Three times a day (TID) | ORAL | 0 refills | Status: DC

## 2018-09-09 MED ORDER — ALBUTEROL SULFATE HFA 108 (90 BASE) MCG/ACT IN AERS
2.0000 | INHALATION_SPRAY | Freq: Once | RESPIRATORY_TRACT | Status: AC
  Administered 2018-09-09: 2 via RESPIRATORY_TRACT
  Filled 2018-09-09: qty 6.7

## 2018-09-09 NOTE — ED Notes (Signed)
Declined W/C at D/C and was escorted to lobby by RN. 

## 2018-09-09 NOTE — ED Triage Notes (Signed)
Pt states she started having a sore throat, cough, runny nose, night sweats, SOB last week. Pt has chronic bronchitis.

## 2018-09-09 NOTE — ED Provider Notes (Signed)
[facility] EMERGENCY DEPARTMENT Provider Note   CSN: [id number] Arrival date & time: 09/09/18  7829     History   Chief Complaint Chief Complaint  Patient presents with  . Sore Throat    HPI Sydney Deleon is a 27 y.o. female.  Sydney Deleon is a 27 y.o. Female with a history of bipolar disorder, asthma, chronic bronchitis and ulcerative colitis, who presents to the emergency department for evaluation of 7 days of sore throat, nasal congestion, runny nose, cough.  Since onset symptoms have been constant and worsening and she is intermittently felt short of breath.  She has not noticed any wheezing.  Does report history of chronic bronchitis but does not have an inhaler at this time.  She reports she thinks the shortness of breath is primarily due to nasal congestion.  She has been drinking lots of orange juice but has not been taking anything to manage her symptoms.  She reports associated chills and some night sweats but no fevers.  Cough is occasionally productive, but primarily dry.  She denies any associated chest pain.  No nausea, vomiting or abdominal pain.  Denies any ear pain.  No headaches or neck stiffness.  Denies any other aggravating or relieving factors.     Past Medical History:  Diagnosis Date  . Acid reflux   . Allergy   . Anxiety   . Arthritis   . Asthma   . Bipolar 1 disorder ([place])   . Chronic bronchitis ([place])   . Depression   . Miscarriage within last 12 months     Patient Active Problem List   Diagnosis Date Noted  . MDD (major depressive disorder), severe ([place]) 06/14/2018  . MDD (major depressive disorder), recurrent severe, without psychosis ([place])   . Ulcerative colitis ([place]) 05/12/2018  . Diarrhea 04/26/2018  . Generalized abdominal pain 04/26/2018  . Nausea and vomiting 04/26/2018  . Depression   . Bipolar 1 disorder ([place])   . Asthma   . Acid reflux     Past Surgical History:  Procedure Laterality Date  .  TONSILECTOMY/ADENOIDECTOMY WITH MYRINGOTOMY       OB History    Gravida  2   Para      Term      Preterm      AB      Living        SAB      TAB      Ectopic      Multiple      Live Births               Home Medications    Prior to Admission medications   Medication Sig Start Date End Date Taking? Authorizing Provider  albuterol (PROVENTIL HFA;VENTOLIN HFA) 108 (90 Base) MCG/ACT inhaler Inhale 2 puffs into the lungs every 4 (four) hours as needed for wheezing or shortness of breath.    [provider]  ARIPiprazole (ABILIFY) 5 MG tablet Take 1 tablet (5 mg total) by mouth daily. For mood control 06/26/18   Money, Lowry Ram, FNP  citalopram (CELEXA) 40 MG tablet Take 1 tablet (40 mg total) by mouth daily. For mood control 06/26/18   Money, Lowry Ram, FNP  folic acid (FOLVITE) 562 MCG tablet Take 400 mcg by mouth daily.    [provider]  hydrOXYzine (ATARAX/VISTARIL) 25 MG tablet Take 1 tablet (25 mg total) by mouth 3 (three) times daily as needed for anxiety. 06/26/18  Money, Lowry Ram, FNP  Probiotic Product ([place]) Take by mouth daily.    [provider]  raNITIdine HCl (ZANTAC PO) Take 75 mg by mouth 2 (two) times daily.     [provider]  sulfaSALAzine (AZULFIDINE) 500 MG tablet Take 2 tablets by mouth twice daily with food. 07/05/18   Zehr, Laban Emperor, PA-C  traZODone (DESYREL) 50 MG tablet Take 1 tablet (50 mg total) by mouth at bedtime as needed for sleep. 06/26/18   Money, Lowry Ram, FNP    Family History Family History  Problem Relation Age of Onset  . Hypertension Mother   . Throat cancer Paternal Grandfather   . Colon cancer Cousin        2 cousins  . Breast cancer Cousin   . Rectal cancer Cousin   . Breast cancer Cousin   . Esophageal cancer Neg Hx   . Stomach cancer Neg Hx     Social History Social History   Tobacco Use  . Smoking status: Never Smoker  . Smokeless tobacco: Never Used    Substance Use Topics  . Alcohol use: No  . Drug use: No     Allergies   Prednisone   Review of Systems Review of Systems  Constitutional: Positive for chills. Negative for fever.  HENT: Positive for congestion, rhinorrhea, sinus pressure and sore throat. Negative for ear pain, trouble swallowing and voice change.   Eyes: Negative for visual disturbance.  Respiratory: Positive for cough and shortness of breath. Negative for chest tightness and wheezing.   Cardiovascular: Negative for chest pain.  Gastrointestinal: Negative for abdominal pain, nausea and vomiting.  Genitourinary: Negative for dysuria.  Musculoskeletal: Positive for myalgias. Negative for arthralgias, neck pain and neck stiffness.  Skin: Negative for color change and rash.  Neurological: Negative for dizziness, syncope, light-headedness and headaches.     Physical Exam Updated Vital Signs BP 123/75 (BP Location: Right Arm)   Pulse 97   Temp 97.6 F (36.4 C) (Oral)   Resp 20   LMP 08/26/2018 (Approximate)   SpO2 97%   Physical Exam  Constitutional: She appears well-developed and well-nourished. She does not appear ill. No distress.  HENT:  Head: Normocephalic and atraumatic.  Right Ear: Tympanic membrane normal.  Left Ear: Tympanic membrane normal.  Mouth/Throat: Uvula is midline. Posterior oropharyngeal erythema present. Tonsils are 1+ on the right. Tonsils are 1+ on the left.  TMs clear with good landmarks, moderate nasal mucosa edema with clear rhinorrhea, posterior oropharynx clear and moist, with some erythema, no edema or exudates, uvula midline  Eyes: Right eye exhibits no discharge. Left eye exhibits no discharge.  Neck: Neck supple.  No rigidity  Cardiovascular: Normal rate, regular rhythm, normal heart sounds and intact distal pulses.  Pulmonary/Chest: Effort normal and breath sounds normal. No respiratory distress.  Respirations equal and unlabored, patient able to speak in full sentences,  lungs clear to auscultation bilaterally, occasional cough during exam  Abdominal: Soft. Bowel sounds are normal. She exhibits no distension and no mass. There is no tenderness. There is no guarding.  Abdomen soft, nondistended, nontender to palpation in all quadrants without guarding or peritoneal signs  Musculoskeletal: She exhibits no deformity.  Lymphadenopathy:    She has no cervical adenopathy.  Neurological: She is alert.  Skin: Skin is warm and dry. Capillary refill takes less than 2 seconds. She is not diaphoretic.  Nursing note and vitals reviewed.    ED Treatments / Results  Labs (all  labs ordered are listed, but only abnormal results are displayed) Labs Reviewed  GROUP A STREP BY PCR    EKG None  Radiology Dg Chest 2 View  Result Date: 09/09/2018 CLINICAL DATA:  Sore throat, cough and shortness of breath for 1 week. EXAM: CHEST - 2 VIEW COMPARISON:  None. FINDINGS: The cardiomediastinal silhouette is unremarkable. There is no evidence of focal airspace disease, pulmonary edema, suspicious pulmonary nodule/mass, pleural effusion, or pneumothorax. No acute bony abnormalities are identified. IMPRESSION: No active cardiopulmonary disease. Electronically Signed   By: Margarette Canada M.D.   On: 09/09/2018 09:05    Procedures Procedures (including critical care time)  Medications Ordered in ED Medications  albuterol (PROVENTIL HFA;VENTOLIN HFA) 108 (90 Base) MCG/ACT inhaler 2 puff (has no administration in time range)  ipratropium-albuterol (DUONEB) 0.5-2.5 (3) MG/3ML nebulizer solution 3 mL (3 mLs Nebulization Given 09/09/18 0912)  ibuprofen (ADVIL,MOTRIN) tablet 600 mg (600 mg Oral Given 09/09/18 0912)  benzonatate (TESSALON) capsule 200 mg (200 mg Oral Given 09/09/18 0912)     Initial Impression / Assessment and Plan / ED Course  I have reviewed the triage vital signs and the nursing notes.  Pertinent labs & imaging results that were available during my care of the  patient were reviewed by me and considered in my medical decision making (see chart for details).  Pt presents with nasal congestion and cough. Pt is well appearing and vitals are normal. Lungs CTA on exam. Pt CXR negative for acute infiltrate. Strep negative. Patients symptoms are consistent with URI, likely viral etiology. Discussed that antibiotics are not indicated for viral infections. Pt will be discharged with symptomatic treatment.  Verbalizes understanding and is agreeable with plan. Pt is hemodynamically stable & in NAD prior to dc.   Final Clinical Impressions(s) / ED Diagnoses   Final diagnoses:  Viral URI with cough    ED Discharge Orders    None       Janet Berlin 09/09/18 1007    Julianne Rice, MD 09/10/18 [phone]

## 2018-09-09 NOTE — Discharge Instructions (Signed)
Your symptoms are likely caused by a viral upper respiratory infection. Antibiotics are not helpful in treating viral infection, the virus should run its course in about 5-7 days. Please make sure you are drinking plenty of fluids. You can treat your symptoms supportively with tylenol/ibuprofen for fevers and pains, Zyrtec and Flonase to help with nasal congestion, and prescribed cough medication and throat lozenges to help with cough. Albuterol inhaler as needed for SOB. If your symptoms are not improving please follow up with you Primary doctor.   If you develop persistent fevers, shortness of breath or difficulty breathing, chest pain, severe headache and neck pain, persistent nausea and vomiting or other new or concerning symptoms return to the Emergency department.

## 2018-09-21 NOTE — Congregational Nurse Program (Signed)
Client requested assistance with gaining access to the [facility] clinic.  Assisted client with completing paperwork for the clinic and to see Sydney Coots NP

## 2018-09-26 NOTE — Congregational Nurse Program (Signed)
Client complained of sore throat and dry cough. BBS- CTA. She has a history of asthma and doesn't have an inhaler. CN advised client to be evaluated at urgent care if symptoms did not improve/she was having increased breathing difficulty. Client agrees to plan.

## 2018-09-26 NOTE — Congregational Nurse Program (Signed)
New client encounter. Referred by [place] intern. Client states long history of behavioral health concerns and has not had access to medication. She states she is very stressed living in the shelter. CN advised seeing Talbert Nan nurse at [facility] clinic and walk in appointment at [facility] for [facility] medication. Bus passes given. Cn advised follow up in CN clinic on Friday to talknabout completion of appointments. Client agrees to plan.

## 2018-10-09 ENCOUNTER — Emergency Department (HOSPITAL_COMMUNITY)
Admission: EM | Admit: 2018-10-09 | Discharge: 2018-10-09 | Disposition: A | Discharge status: Home / Self Care | Payer: Self-pay | Attending: Emergency Medicine | Admitting: Emergency Medicine

## 2018-10-09 ENCOUNTER — Other Ambulatory Visit: Payer: Self-pay

## 2018-10-09 ENCOUNTER — Emergency Department (HOSPITAL_COMMUNITY): Payer: Self-pay

## 2018-10-09 ENCOUNTER — Encounter (HOSPITAL_COMMUNITY): Payer: Self-pay | Admitting: Emergency Medicine

## 2018-10-09 DIAGNOSIS — Z79899 Other long term (current) drug therapy: Secondary | ICD-10-CM | POA: Insufficient documentation

## 2018-10-09 DIAGNOSIS — R69 Illness, unspecified: Secondary | ICD-10-CM

## 2018-10-09 DIAGNOSIS — R0789 Other chest pain: Secondary | ICD-10-CM | POA: Insufficient documentation

## 2018-10-09 DIAGNOSIS — J111 Influenza due to unidentified influenza virus with other respiratory manifestations: Secondary | ICD-10-CM | POA: Insufficient documentation

## 2018-10-09 DIAGNOSIS — J209 Acute bronchitis, unspecified: Secondary | ICD-10-CM | POA: Insufficient documentation

## 2018-10-09 DIAGNOSIS — R05 Cough: Secondary | ICD-10-CM | POA: Insufficient documentation

## 2018-10-09 DIAGNOSIS — F319 Bipolar disorder, unspecified: Secondary | ICD-10-CM | POA: Insufficient documentation

## 2018-10-09 LAB — URINALYSIS, ROUTINE W REFLEX MICROSCOPIC
Bilirubin Urine: NEGATIVE
GLUCOSE, UA: NEGATIVE mg/dL
Ketones, ur: NEGATIVE mg/dL
Leukocytes, UA: NEGATIVE
Nitrite: NEGATIVE
Protein, ur: NEGATIVE mg/dL
Specific Gravity, Urine: 1.01 (ref 1.005–1.030)
pH: 7 (ref 5.0–8.0)

## 2018-10-09 LAB — CBC WITH DIFFERENTIAL/PLATELET
Abs Immature Granulocytes: 0.01 10*3/uL (ref 0.00–0.07)
Basophils Absolute: 0 10*3/uL (ref 0.0–0.1)
Basophils Relative: 1 %
EOS PCT: 6 %
Eosinophils Absolute: 0.3 10*3/uL (ref 0.0–0.5)
HCT: 34.2 % — ABNORMAL LOW (ref 36.0–46.0)
Hemoglobin: 11 g/dL — ABNORMAL LOW (ref 12.0–15.0)
Immature Granulocytes: 0 %
LYMPHS PCT: 26 %
Lymphs Abs: 1.4 10*3/uL (ref 0.7–4.0)
MCH: 28.9 pg (ref 26.0–34.0)
MCHC: 32.2 g/dL (ref 30.0–36.0)
MCV: 89.8 fL (ref 80.0–100.0)
Monocytes Absolute: 0.7 10*3/uL (ref 0.1–1.0)
Monocytes Relative: 13 %
Neutro Abs: 2.9 10*3/uL (ref 1.7–7.7)
Neutrophils Relative %: 54 %
Platelets: 308 10*3/uL (ref 150–400)
RBC: 3.81 MIL/uL — ABNORMAL LOW (ref 3.87–5.11)
RDW: 16.1 % — ABNORMAL HIGH (ref 11.5–15.5)
WBC: 5.3 10*3/uL (ref 4.0–10.5)
nRBC: 0 % (ref 0.0–0.2)

## 2018-10-09 LAB — URINALYSIS, MICROSCOPIC (REFLEX)

## 2018-10-09 LAB — COMPREHENSIVE METABOLIC PANEL
ALT: 15 U/L (ref 0–44)
AST: 28 U/L (ref 15–41)
Albumin: 3.8 g/dL (ref 3.5–5.0)
Alkaline Phosphatase: 36 U/L — ABNORMAL LOW (ref 38–126)
Anion gap: 9 (ref 5–15)
BUN: 12 mg/dL (ref 6–20)
CO2: 19 mmol/L — ABNORMAL LOW (ref 22–32)
Calcium: 8.9 mg/dL (ref 8.9–10.3)
Chloride: 108 mmol/L (ref 98–111)
Creatinine, Ser: 0.88 mg/dL (ref 0.44–1.00)
GFR calc Af Amer: >60 mL/min (ref >60)
Glucose, Bld: 102 mg/dL — ABNORMAL HIGH (ref 70–99)
Potassium: 4.6 mmol/L (ref 3.5–5.1)
Sodium: 136 mmol/L (ref 135–145)
Total Bilirubin: 0.7 mg/dL (ref 0.3–1.2)
Total Protein: 7.4 g/dL (ref 6.5–8.1)

## 2018-10-09 LAB — PREGNANCY, URINE: Preg Test, Ur: NEGATIVE

## 2018-10-09 LAB — TROPONIN I: Troponin I: <0.03 ng/mL (ref <0.03)

## 2018-10-09 MED ORDER — DEXAMETHASONE SODIUM PHOSPHATE 10 MG/ML IJ SOLN
10.0000 mg | Freq: Once | INTRAMUSCULAR | Status: AC
  Administered 2018-10-09: 10 mg via INTRAVENOUS
  Filled 2018-10-09: qty 1

## 2018-10-09 MED ORDER — ALBUTEROL SULFATE HFA 108 (90 BASE) MCG/ACT IN AERS: 2.0000 | INHALATION_SPRAY | RESPIRATORY_TRACT | 0 refills | Status: AC | PRN

## 2018-10-09 MED ORDER — SODIUM CHLORIDE 0.9 % IV BOLUS
1000.0000 mL | Freq: Once | INTRAVENOUS | Status: AC
  Administered 2018-10-09: 1000 mL via INTRAVENOUS

## 2018-10-09 MED ORDER — ALBUTEROL SULFATE (2.5 MG/3ML) 0.083% IN NEBU
5.0000 mg | INHALATION_SOLUTION | Freq: Once | RESPIRATORY_TRACT | Status: AC
  Administered 2018-10-09: 5 mg via RESPIRATORY_TRACT
  Filled 2018-10-09: qty 6

## 2018-10-09 MED ORDER — IBUPROFEN 400 MG PO TABS
600.0000 mg | ORAL_TABLET | Freq: Once | ORAL | Status: AC
  Administered 2018-10-09: 600 mg via ORAL
  Filled 2018-10-09: qty 1

## 2018-10-09 NOTE — ED Notes (Signed)
Assisted to [facility] - u/a obtained

## 2018-10-09 NOTE — ED Notes (Signed)
Pt was able to use bedside toilet, tolerated well with one assist.

## 2018-10-09 NOTE — ED Triage Notes (Signed)
To ED via [facility] from homeless [facility] with c/o shortness of breath/flu like symptoms/ fever/coughing/ Started this am.

## 2018-10-09 NOTE — ED Provider Notes (Signed)
[facility] EMERGENCY DEPARTMENT Provider Note   CSN: [id number] Arrival date & time: 10/09/18  0707     History   Chief Complaint Chief Complaint  Patient presents with  . flu like sx  . URI    HPI Sydney Deleon is a 27 y.o. female.  HPI  27 year old female with a history of chronic bronchitis and bipolar disorder presents with generalized weakness and chest/back pain.  She is been having a cough and sore throat for about 6 days.  She states the cough is wet but nothing comes up.  She is been having shortness of breath with some wheezing that is similar to her chronic bronchitis.  She has been having some muscle aches diffusely as well as chills and night sweats.  She has not noticed a fever though EMS had a possible fever on their thermometer.  However EMS told me that there thermometer had various readings that were never similar, a couple of which were febrile.  She was given 1000 mg Tylenol by them.  The patient states that she is been having continuous chest pain and left-sided back pain during the same time.  Worse with coughing.  No leg swelling or hemoptysis.  The patient states she was hyperventilating when EMS arrived and was having some tingling in her hands and some numbness in her left hand and arm.  This went away after about 2 minutes.  Past Medical History:  Diagnosis Date  . Acid reflux   . Allergy   . Anxiety   . Arthritis   . Asthma   . Bipolar 1 disorder ([place])   . Chronic bronchitis ([place])   . Depression   . Miscarriage within last 12 months     Patient Active Problem List   Diagnosis Date Noted  . MDD (major depressive disorder), severe ([place]) 06/14/2018  . MDD (major depressive disorder), recurrent severe, without psychosis ([place])   . Ulcerative colitis ([place]) 05/12/2018  . Diarrhea 04/26/2018  . Generalized abdominal pain 04/26/2018  . Nausea and vomiting 04/26/2018  . Depression   . Bipolar 1 disorder ([place])   . Asthma   .  Acid reflux     Past Surgical History:  Procedure Laterality Date  . TONSILECTOMY/ADENOIDECTOMY WITH MYRINGOTOMY       OB History    Gravida  2   Para      Term      Preterm      AB      Living        SAB      TAB      Ectopic      Multiple      Live Births               Home Medications    Prior to Admission medications   Medication Sig Start Date End Date Taking? Authorizing Provider  ARIPiprazole (ABILIFY) 5 MG tablet Take 1 tablet (5 mg total) by mouth daily. For mood control 06/26/18  Yes Money, Lowry Ram, FNP  citalopram (CELEXA) 40 MG tablet Take 1 tablet (40 mg total) by mouth daily. For mood control 06/26/18  Yes Money, Lowry Ram, FNP  folic acid (FOLVITE) 409 MCG tablet Take 400 mcg by mouth daily.   Yes [provider]  hydrOXYzine (ATARAX/VISTARIL) 25 MG tablet Take 1 tablet (25 mg total) by mouth 3 (three) times daily as needed for anxiety. 06/26/18  Yes Money, Lowry Ram, FNP  Probiotic Product (PHILLIPS COLON  HEALTH PO) Take 1 capsule by mouth daily.    Yes [provider]  raNITIdine HCl (ZANTAC PO) Take 75 mg by mouth 2 (two) times daily.    Yes [provider]  sulfaSALAzine (AZULFIDINE) 500 MG tablet Take 2 tablets by mouth twice daily with food. 07/05/18  Yes Zehr, Laban Emperor, PA-C  traZODone (DESYREL) 50 MG tablet Take 1 tablet (50 mg total) by mouth at bedtime as needed for sleep. 06/26/18  Yes Money, Lowry Ram, FNP  albuterol (PROVENTIL HFA;VENTOLIN HFA) 108 (90 Base) MCG/ACT inhaler Inhale 2 puffs into the lungs every 4 (four) hours as needed for wheezing or shortness of breath. 10/09/18   Sherwood Gambler, MD  benzonatate (TESSALON) 100 MG capsule Take 2 capsules (200 mg total) by mouth every 8 (eight) hours. Patient not taking: Reported on 10/09/2018 09/09/18   Jacqlyn Larsen, PA-C    Family History Family History  Problem Relation Age of Onset  . Hypertension Mother   . Throat cancer Paternal Grandfather   . Colon  cancer Cousin        2 cousins  . Breast cancer Cousin   . Rectal cancer Cousin   . Breast cancer Cousin   . Esophageal cancer Neg Hx   . Stomach cancer Neg Hx     Social History Social History   Tobacco Use  . Smoking status: Never Smoker  . Smokeless tobacco: Never Used  Substance Use Topics  . Alcohol use: No  . Drug use: No     Allergies   Prednisone   Review of Systems Review of Systems  Constitutional: Positive for chills and diaphoresis. Negative for fever.  HENT: Positive for sore throat.   Respiratory: Positive for cough and shortness of breath.   Gastrointestinal: Negative for abdominal pain and vomiting.  Musculoskeletal: Positive for back pain.  Neurological: Positive for numbness. Negative for headaches.  All other systems reviewed and are negative.    Physical Exam Updated Vital Signs BP 118/65   Pulse 90   Temp 98.2 F (36.8 C) (Oral)   Resp (!) 22   Ht 5' 5"  (1.651 m)   Wt 115.2 kg   LMP 09/20/2018 (Exact Date)   SpO2 99%   BMI 42.27 kg/m   Physical Exam Vitals signs and nursing note reviewed.  Constitutional:      Appearance: She is well-developed. She is obese.  HENT:     Head: Normocephalic and atraumatic.     Right Ear: External ear normal.     Left Ear: External ear normal.     Nose: Nose normal.     Mouth/Throat:     Pharynx: No oropharyngeal exudate or posterior oropharyngeal erythema.  Eyes:     General:        Right eye: No discharge.        Left eye: No discharge.  Cardiovascular:     Rate and Rhythm: Normal rate and regular rhythm.     Heart sounds: Normal heart sounds.  Pulmonary:     Effort: Tachypnea present. No accessory muscle usage.     Breath sounds: Wheezing (mild, expiratory) present.  Abdominal:     Palpations: Abdomen is soft.     Tenderness: There is no abdominal tenderness.  Musculoskeletal:     Right lower leg: No edema.     Left lower leg: No edema.  Skin:    General: Skin is warm and dry.    Neurological:     Mental Status: She is  alert.     Comments: CN 3-12 grossly intact. Equal strength in all 4 extremities though with poor effort.  Grossly normal sensation.   Psychiatric:        Mood and Affect: Mood is not anxious.      ED Treatments / Results  Labs (all labs ordered are listed, but only abnormal results are displayed) Labs Reviewed  COMPREHENSIVE METABOLIC PANEL - Abnormal; Notable for the following components:      Result Value   CO2 19 (*)    Glucose, Bld 102 (*)    Alkaline Phosphatase 36 (*)    All other components within normal limits  CBC WITH DIFFERENTIAL/PLATELET - Abnormal; Notable for the following components:   RBC 3.81 (*)    Hemoglobin 11.0 (*)    HCT 34.2 (*)    RDW 16.1 (*)    All other components within normal limits  URINALYSIS, ROUTINE W REFLEX MICROSCOPIC - Abnormal; Notable for the following components:   Hgb urine dipstick SMALL (*)    All other components within normal limits  URINALYSIS, MICROSCOPIC (REFLEX) - Abnormal; Notable for the following components:   Bacteria, UA RARE (*)    All other components within normal limits  TROPONIN I  PREGNANCY, URINE    EKG EKG Interpretation  Date/Time:  Monday October 09 2018 07:16:18 EST Ventricular Rate:  81 PR Interval:    QRS Duration: 90 QT Interval:  388 QTC Calculation: 451 R Axis:   14 Text Interpretation:  Sinus rhythm Low voltage, precordial leads No significant change since last tracing Confirmed by Duffy Bruce [phone]) on 10/09/2018 7:25:40 AM   Radiology Dg Chest 2 View  Result Date: 10/09/2018 CLINICAL DATA:  Cold sweats.  Body aches.  Productive cough. EXAM: CHEST - 2 VIEW COMPARISON:  09/09/2018. FINDINGS: Mediastinum hilar structures normal. Low lung volumes. No focal alveolar infiltrate. No pleural effusion or pneumothorax. Heart size stable. No acute bony abnormality. IMPRESSION: Low lung volumes.  No acute infiltrate noted. Electronically Signed   By: Marcello Moores   Register   On: 10/09/2018 08:25    Procedures Procedures (including critical care time)  Medications Ordered in ED Medications  sodium chloride 0.9 % bolus 1,000 mL (0 mLs Intravenous Stopped 10/09/18 0944)  ibuprofen (ADVIL,MOTRIN) tablet 600 mg (600 mg Oral Given 10/09/18 0741)  albuterol (PROVENTIL) (2.5 MG/3ML) 0.083% nebulizer solution 5 mg (5 mg Nebulization Given 10/09/18 0741)  dexamethasone (DECADRON) injection 10 mg (10 mg Intravenous Given 10/09/18 0944)     Initial Impression / Assessment and Plan / ED Course  I have reviewed the triage vital signs and the nursing notes.  Pertinent labs & imaging results that were available during my care of the patient were reviewed by me and considered in my medical decision making (see chart for details).     Patient likely has a respiratory illness causing all of her symptoms.  I think the transient numbness/tingling even those more unilateral than bilateral was probably from the tachypnea/hyperventilation.  This is supported by the decreased bicarb yet with no anion gap elevation.  Otherwise her lab work is baseline and reassuring.  She is feeling better after albuterol.  I discussion with patient and she does say that she has asthma.  She has an allergic reaction to prednisone but is more side effect/psychiatric complaints.  I discussed trying IV Decadron and she is okay with this.  Otherwise she is not ill-appearing and is feeling better and much more alert and awake.  She appears stable  for discharge home with continued fluids and symptomatic care.  There is no obvious bacterial infection so I do not think antibiotics are warranted.  Is possible she had the flu but I do not think testing after 6 days of symptoms would change management as she is well outside the Tamiflu treatment window.  Thus she will be given return precautions but otherwise instructed to follow-up closely with PCP.  Final Clinical Impressions(s) / ED Diagnoses   Final  diagnoses:  Acute bronchitis, unspecified organism  Influenza-like illness    ED Discharge Orders         Ordered    albuterol (PROVENTIL HFA;VENTOLIN HFA) 108 (90 Base) MCG/ACT inhaler  Every 4 hours PRN     10/09/18 4801           Sherwood Gambler, MD 10/09/18 1034

## 2018-10-09 NOTE — Discharge Instructions (Addendum)
Be sure to use your albuterol inhaler 2 puffs every 4 hours.  You most likely have a viral illness.  If you develop high fever, worsening shortness of breath, vomiting, or any other new/concerning symptoms and return to the ER for evaluation.  Otherwise follow-up closely with your primary care physician.

## 2018-10-25 ENCOUNTER — Other Ambulatory Visit: Payer: Self-pay | Admitting: Critical Care Medicine

## 2018-10-30 ENCOUNTER — Encounter (HOSPITAL_COMMUNITY): Payer: Self-pay

## 2018-10-30 ENCOUNTER — Other Ambulatory Visit: Payer: Self-pay

## 2018-10-30 ENCOUNTER — Emergency Department (HOSPITAL_COMMUNITY)
Admission: EM | Admit: 2018-10-30 | Discharge: 2018-10-30 | Disposition: A | Discharge status: Home / Self Care | Payer: Self-pay | Attending: Emergency Medicine | Admitting: Emergency Medicine

## 2018-10-30 DIAGNOSIS — J45909 Unspecified asthma, uncomplicated: Secondary | ICD-10-CM | POA: Insufficient documentation

## 2018-10-30 DIAGNOSIS — Z79899 Other long term (current) drug therapy: Secondary | ICD-10-CM | POA: Insufficient documentation

## 2018-10-30 DIAGNOSIS — R05 Cough: Secondary | ICD-10-CM | POA: Insufficient documentation

## 2018-10-30 DIAGNOSIS — R059 Cough, unspecified: Secondary | ICD-10-CM

## 2018-10-30 MED ORDER — BENZONATATE 100 MG PO CAPS: 100.0000 mg | ORAL_CAPSULE | Freq: Three times a day (TID) | ORAL | 0 refills | Status: DC

## 2018-10-30 NOTE — ED Provider Notes (Signed)
[facility] EMERGENCY DEPARTMENT Provider Note   CSN: [id number] Arrival date & time: 10/30/18  1041   History   Chief Complaint Chief Complaint  Patient presents with  . Fatigue  . Cough    HPI Sydney Deleon is a 28 y.o. female.  HPI   28 year old female presents today with complaints of cough.  Patient notes she was not feeling well last night and developed cough.  She took black elderberry but felt that it made her cloudy headed this morning.  Patient notes she felt hot but no objective fever.  She is a non-smoker but notes a history of asthma.  She notes some minor shortness of breath.  She notes associated runny nose nasal congestion.  Past Medical History:  Diagnosis Date  . Acid reflux   . Allergy   . Anxiety   . Arthritis   . Asthma   . Bipolar 1 disorder ([place])   . Chronic bronchitis ([place])   . Depression   . Miscarriage within last 12 months     Patient Active Problem List   Diagnosis Date Noted  . MDD (major depressive disorder), severe ([place]) 06/14/2018  . MDD (major depressive disorder), recurrent severe, without psychosis ([place])   . Ulcerative colitis ([place]) 05/12/2018  . Diarrhea 04/26/2018  . Generalized abdominal pain 04/26/2018  . Nausea and vomiting 04/26/2018  . Depression   . Bipolar 1 disorder ([place])   . Asthma   . Acid reflux     Past Surgical History:  Procedure Laterality Date  . TONSILECTOMY/ADENOIDECTOMY WITH MYRINGOTOMY       OB History    Gravida  2   Para      Term      Preterm      AB      Living        SAB      TAB      Ectopic      Multiple      Live Births               Home Medications    Prior to Admission medications   Medication Sig Start Date End Date Taking? Authorizing Provider  albuterol (PROVENTIL HFA;VENTOLIN HFA) 108 (90 Base) MCG/ACT inhaler Inhale 2 puffs into the lungs every 4 (four) hours as needed for wheezing or shortness of breath. 10/09/18   Sherwood Gambler, MD   ARIPiprazole (ABILIFY) 5 MG tablet Take 1 tablet (5 mg total) by mouth daily. For mood control 06/26/18   Money, Lowry Ram, FNP  benzonatate (TESSALON) 100 MG capsule Take 1 capsule (100 mg total) by mouth every 8 (eight) hours. 10/30/18   [employer]andi [employer], Dellis Filbert, PA-C  citalopram (CELEXA) 40 MG tablet Take 1 tablet (40 mg total) by mouth daily. For mood control 06/26/18   Money, Lowry Ram, FNP  folic acid (FOLVITE) 387 MCG tablet Take 400 mcg by mouth daily.    [provider]  hydrOXYzine (ATARAX/VISTARIL) 25 MG tablet Take 1 tablet (25 mg total) by mouth 3 (three) times daily as needed for anxiety. 06/26/18   Money, Lowry Ram, FNP  Probiotic Product (PHILLIPS COLON HEALTH PO) Take 1 capsule by mouth daily.     [provider]  raNITIdine HCl (ZANTAC PO) Take 75 mg by mouth 2 (two) times daily.     [provider]  sulfaSALAzine (AZULFIDINE) 500 MG tablet Take 2 tablets by mouth twice daily with food. 07/05/18   Zehr, Laban Emperor, PA-C  traZODone (  DESYREL) 50 MG tablet Take 1 tablet (50 mg total) by mouth at bedtime as needed for sleep. 06/26/18   Money, Lowry Ram, FNP    Family History Family History  Problem Relation Age of Onset  . Hypertension Mother   . Throat cancer Paternal Grandfather   . Colon cancer Cousin        2 cousins  . Breast cancer Cousin   . Rectal cancer Cousin   . Breast cancer Cousin   . Esophageal cancer Neg Hx   . Stomach cancer Neg Hx     Social History Social History   Tobacco Use  . Smoking status: Never Smoker  . Smokeless tobacco: Never Used  Substance Use Topics  . Alcohol use: No  . Drug use: No     Allergies   Prednisone   Review of Systems Review of Systems  All other systems reviewed and are negative.    Physical Exam Updated Vital Signs BP 131/74 (BP Location: Right Arm)   Pulse 93   Temp 97.7 F (36.5 C) (Oral)   Resp 18   LMP 10/17/2017   SpO2 100%   Physical Exam Vitals signs and nursing note reviewed.    Constitutional:      Appearance: She is well-developed.  HENT:     Head: Normocephalic and atraumatic.     Comments: Rhinorrhea Eyes:     General: No scleral icterus.       Right eye: No discharge.        Left eye: No discharge.     Conjunctiva/sclera: Conjunctivae normal.     Pupils: Pupils are equal, round, and reactive to light.  Neck:     Musculoskeletal: Normal range of motion.     Vascular: No JVD.     Trachea: No tracheal deviation.  Pulmonary:     Effort: Pulmonary effort is normal. No respiratory distress.     Breath sounds: No stridor. No wheezing, rhonchi or rales.     Comments: Cough noted Neurological:     Mental Status: She is alert and oriented to person, place, and time.     Coordination: Coordination normal.  Psychiatric:        Behavior: Behavior normal.        Thought Content: Thought content normal.        Judgment: Judgment normal.      ED Treatments / Results  Labs (all labs ordered are listed, but only abnormal results are displayed) Labs Reviewed - No data to display  EKG None  Radiology No results found.  Procedures Procedures (including critical care time)  Medications Ordered in ED Medications - No data to display   Initial Impression / Assessment and Plan / ED Course  I have reviewed the triage vital signs and the nursing notes.  Pertinent labs & imaging results that were available during my care of the patient were reviewed by me and considered in my medical decision making (see chart for details).     Labs:   Imaging:  Consults:  Therapeutics:  Discharge Meds:   Assessment/Plan: 28 year old female presents today with cough likely secondary to upper respiratory infection.  She is afebrile well-appearing in no acute distress.  She has clear lung sounds reassuring oxygen saturation and afebrile.  Low suspicion for bacterial infection low suspicion for influenza.  Discharged with cough medication outpatient follow-up and  strict return precautions.  She verbalized understanding and agreement to today's plan.      Final Clinical Impressions(s) /  ED Diagnoses   Final diagnoses:  Cough    ED Discharge Orders         Ordered    benzonatate (TESSALON) 100 MG capsule  Every 8 hours     10/30/18 1111           Okey Regal, PA-C 10/30/18 1114    Julianne Rice, MD 10/31/18 [phone]

## 2018-10-30 NOTE — ED Notes (Signed)
EDP at bedside  

## 2018-10-30 NOTE — Discharge Instructions (Addendum)
Please read attached information. If you experience any new or worsening signs or symptoms please return to the emergency room for evaluation. Please follow-up with your primary care provider or specialist as discussed. Please use medication prescribed only as directed and discontinue taking if you have any concerning signs or symptoms.   °

## 2018-10-30 NOTE — ED Triage Notes (Signed)
Pt states she has had cough and took elderberry syrup this morning. Woke up feeling light headed as well as sore throat. Pt alert and oriented. Lives at homeless shelter and states many other residents have been sick.

## 2018-11-10 ENCOUNTER — Emergency Department (HOSPITAL_COMMUNITY): Payer: Self-pay

## 2018-11-10 ENCOUNTER — Emergency Department (HOSPITAL_COMMUNITY)
Admission: EM | Admit: 2018-11-10 | Discharge: 2018-11-11 | Disposition: A | Discharge status: Home / Self Care | Payer: Self-pay | Attending: Emergency Medicine | Admitting: Emergency Medicine

## 2018-11-10 ENCOUNTER — Encounter (HOSPITAL_COMMUNITY): Payer: Self-pay | Admitting: Emergency Medicine

## 2018-11-10 DIAGNOSIS — Z79899 Other long term (current) drug therapy: Secondary | ICD-10-CM | POA: Insufficient documentation

## 2018-11-10 DIAGNOSIS — R1011 Right upper quadrant pain: Secondary | ICD-10-CM | POA: Insufficient documentation

## 2018-11-10 DIAGNOSIS — N939 Abnormal uterine and vaginal bleeding, unspecified: Secondary | ICD-10-CM | POA: Insufficient documentation

## 2018-11-10 DIAGNOSIS — R1010 Upper abdominal pain, unspecified: Secondary | ICD-10-CM | POA: Insufficient documentation

## 2018-11-10 DIAGNOSIS — R1012 Left upper quadrant pain: Secondary | ICD-10-CM | POA: Insufficient documentation

## 2018-11-10 DIAGNOSIS — J45909 Unspecified asthma, uncomplicated: Secondary | ICD-10-CM | POA: Insufficient documentation

## 2018-11-10 DIAGNOSIS — R6883 Chills (without fever): Secondary | ICD-10-CM | POA: Insufficient documentation

## 2018-11-10 DIAGNOSIS — R110 Nausea: Secondary | ICD-10-CM | POA: Insufficient documentation

## 2018-11-10 LAB — CBC WITH DIFFERENTIAL/PLATELET
ABS IMMATURE GRANULOCYTES: 0.04 10*3/uL (ref 0.00–0.07)
Basophils Absolute: 0 10*3/uL (ref 0.0–0.1)
Basophils Relative: 0 %
Eosinophils Absolute: 0.2 10*3/uL (ref 0.0–0.5)
Eosinophils Relative: 2 %
HCT: 40.2 % (ref 36.0–46.0)
Hemoglobin: 12.8 g/dL (ref 12.0–15.0)
IMMATURE GRANULOCYTES: 0 %
LYMPHS PCT: 9 %
Lymphs Abs: 1 10*3/uL (ref 0.7–4.0)
MCH: 28.6 pg (ref 26.0–34.0)
MCHC: 31.8 g/dL (ref 30.0–36.0)
MCV: 89.9 fL (ref 80.0–100.0)
Monocytes Absolute: 0.9 10*3/uL (ref 0.1–1.0)
Monocytes Relative: 8 %
NEUTROS ABS: 8.7 10*3/uL — AB (ref 1.7–7.7)
NEUTROS PCT: 81 %
Platelets: 335 10*3/uL (ref 150–400)
RBC: 4.47 MIL/uL (ref 3.87–5.11)
RDW: 15.5 % (ref 11.5–15.5)
WBC: 10.8 10*3/uL — ABNORMAL HIGH (ref 4.0–10.5)
nRBC: 0 % (ref 0.0–0.2)

## 2018-11-10 LAB — COMPREHENSIVE METABOLIC PANEL
ALBUMIN: 4.2 g/dL (ref 3.5–5.0)
ALT: 16 U/L (ref 0–44)
ANION GAP: 11 (ref 5–15)
AST: 20 U/L (ref 15–41)
Alkaline Phosphatase: 39 U/L (ref 38–126)
BUN: 14 mg/dL (ref 6–20)
CO2: 22 mmol/L (ref 22–32)
Calcium: 9.3 mg/dL (ref 8.9–10.3)
Chloride: 103 mmol/L (ref 98–111)
Creatinine, Ser: 0.76 mg/dL (ref 0.44–1.00)
GFR calc Af Amer: >60 mL/min (ref >60)
GFR calc non Af Amer: >60 mL/min (ref >60)
Glucose, Bld: 94 mg/dL (ref 70–99)
Potassium: 3.6 mmol/L (ref 3.5–5.1)
Sodium: 136 mmol/L (ref 135–145)
Total Bilirubin: 0.5 mg/dL (ref 0.3–1.2)
Total Protein: 8.2 g/dL — ABNORMAL HIGH (ref 6.5–8.1)

## 2018-11-10 LAB — I-STAT BETA HCG BLOOD, ED (MC, WL, AP ONLY): I-stat hCG, quantitative: <5 m[IU]/mL (ref <5)

## 2018-11-10 LAB — LACTIC ACID, PLASMA: Lactic Acid, Venous: 1.2 mmol/L (ref 0.5–1.9)

## 2018-11-10 LAB — I-STAT TROPONIN, ED: Troponin i, poc: 0.01 ng/mL (ref 0.00–0.08)

## 2018-11-10 LAB — LIPASE, BLOOD: LIPASE: 48 U/L (ref 11–51)

## 2018-11-10 MED ORDER — ALUM & MAG HYDROXIDE-SIMETH 200-200-20 MG/5ML PO SUSP
30.0000 mL | Freq: Once | ORAL | Status: AC
  Administered 2018-11-10: 30 mL via ORAL
  Filled 2018-11-10: qty 30

## 2018-11-10 MED ORDER — IOHEXOL 300 MG/ML  SOLN
100.0000 mL | Freq: Once | INTRAMUSCULAR | Status: AC | PRN
  Administered 2018-11-10: 100 mL via INTRAVENOUS

## 2018-11-10 MED ORDER — LIDOCAINE VISCOUS HCL 2 % MT SOLN
15.0000 mL | Freq: Once | OROMUCOSAL | Status: AC
  Administered 2018-11-10: 15 mL via ORAL
  Filled 2018-11-10: qty 15

## 2018-11-10 MED ORDER — ONDANSETRON HCL 4 MG/2ML IJ SOLN
4.0000 mg | Freq: Once | INTRAMUSCULAR | Status: AC
  Administered 2018-11-10: 4 mg via INTRAVENOUS
  Filled 2018-11-10: qty 2

## 2018-11-10 MED ORDER — MORPHINE SULFATE (PF) 4 MG/ML IV SOLN
4.0000 mg | Freq: Once | INTRAVENOUS | Status: AC
  Administered 2018-11-10: 4 mg via INTRAVENOUS
  Filled 2018-11-10: qty 1

## 2018-11-10 MED ORDER — SODIUM CHLORIDE 0.9 % IV BOLUS
1000.0000 mL | Freq: Once | INTRAVENOUS | Status: AC
  Administered 2018-11-10: 1000 mL via INTRAVENOUS

## 2018-11-10 NOTE — ED Provider Notes (Signed)
[facility] EMERGENCY DEPARTMENT Provider Note   CSN: [id number] Arrival date & time: 11/10/18  2028     History   Chief Complaint Chief Complaint  Patient presents with  . Syncope    HPI Sydney Deleon is a 28 y.o. female.  The history is provided by the patient, a parent and medical records. No language interpreter was used.  Abdominal Pain  Pain location:  Epigastric, RUQ and LUQ Pain quality: aching, cramping, dull and sharp   Pain radiates to:  Does not radiate Pain severity:  Severe Onset quality:  Gradual Duration:  1 day Timing:  Constant Progression:  Waxing and waning Chronicity:  New Relieved by:  Nothing Worsened by:  Palpation and eating Ineffective treatments:  None tried Associated symptoms: chills, nausea and vaginal bleeding (on menstrual cycle)   Associated symptoms: no chest pain, no constipation, no cough, no diarrhea, no dysuria, no fatigue, no fever, no shortness of breath, no vaginal discharge and no vomiting   Nausea:    Severity:  Severe   Past Medical History:  Diagnosis Date  . Acid reflux   . Allergy   . Anxiety   . Arthritis   . Asthma   . Bipolar 1 disorder ([place])   . Chronic bronchitis ([place])   . Depression   . Miscarriage within last 12 months     Patient Active Problem List   Diagnosis Date Noted  . MDD (major depressive disorder), severe ([place]) 06/14/2018  . MDD (major depressive disorder), recurrent severe, without psychosis ([place])   . Ulcerative colitis ([place]) 05/12/2018  . Diarrhea 04/26/2018  . Generalized abdominal pain 04/26/2018  . Nausea and vomiting 04/26/2018  . Depression   . Bipolar 1 disorder ([employer])   . Asthma   . Acid reflux     Past Surgical History:  Procedure Laterality Date  . TONSILECTOMY/ADENOIDECTOMY WITH MYRINGOTOMY       OB History    Gravida  2   Para      Term      Preterm      AB      Living        SAB      TAB      Ectopic      Multiple      Live  Births               Home Medications    Prior to Admission medications   Medication Sig Start Date End Date Taking? Authorizing Provider  albuterol (PROVENTIL HFA;VENTOLIN HFA) 108 (90 Base) MCG/ACT inhaler Inhale 2 puffs into the lungs every 4 (four) hours as needed for wheezing or shortness of breath. 10/09/18   Sherwood Gambler, MD  ARIPiprazole (ABILIFY) 5 MG tablet Take 1 tablet (5 mg total) by mouth daily. For mood control 06/26/18   Money, Lowry Ram, FNP  benzonatate (TESSALON) 100 MG capsule Take 1 capsule (100 mg total) by mouth every 8 (eight) hours. 10/30/18   Hedges, Dellis Filbert, PA-C  citalopram (CELEXA) 40 MG tablet Take 1 tablet (40 mg total) by mouth daily. For mood control 06/26/18   Money, Lowry Ram, FNP  folic acid (FOLVITE) 300 MCG tablet Take 400 mcg by mouth daily.    [provider]  hydrOXYzine (ATARAX/VISTARIL) 25 MG tablet Take 1 tablet (25 mg total) by mouth 3 (three) times daily as needed for anxiety. 06/26/18   Money, Lowry Ram, FNP  Probiotic Product (PHILLIPS COLON HEALTH PO) Take 1 capsule by  mouth daily.     [provider]  raNITIdine HCl (ZANTAC PO) Take 75 mg by mouth 2 (two) times daily.     [provider]  sulfaSALAzine (AZULFIDINE) 500 MG tablet Take 2 tablets by mouth twice daily with food. 07/05/18   Zehr, Laban Emperor, PA-C  traZODone (DESYREL) 50 MG tablet Take 1 tablet (50 mg total) by mouth at bedtime as needed for sleep. 06/26/18   Money, Lowry Ram, FNP    Family History Family History  Problem Relation Age of Onset  . Hypertension Mother   . Throat cancer Paternal Grandfather   . Colon cancer Cousin        2 cousins  . Breast cancer Cousin   . Rectal cancer Cousin   . Breast cancer Cousin   . Esophageal cancer Neg Hx   . Stomach cancer Neg Hx     Social History Social History   Tobacco Use  . Smoking status: Never Smoker  . Smokeless tobacco: Never Used  Substance Use Topics  . Alcohol use: No  . Drug use: No      Allergies   Prednisone   Review of Systems Review of Systems  Constitutional: Positive for chills. Negative for diaphoresis, fatigue and fever.  HENT: Negative for congestion.   Respiratory: Negative for cough, chest tightness and shortness of breath.   Cardiovascular: Negative for chest pain, palpitations and leg swelling.  Gastrointestinal: Positive for abdominal pain and nausea. Negative for blood in stool, constipation, diarrhea and vomiting.  Genitourinary: Positive for vaginal bleeding (on menstrual cycle). Negative for dysuria, flank pain, frequency and vaginal discharge.  Musculoskeletal: Negative for back pain, neck pain and neck stiffness.  Skin: Negative for rash and wound.  Neurological: Negative for light-headedness and headaches.  Psychiatric/Behavioral: Negative for agitation and confusion.  All other systems reviewed and are negative.    Physical Exam Updated Vital Signs BP 132/84 (BP Location: Right Arm)   Pulse 84   Temp 98.6 F (37 C) (Oral)   Resp 16   LMP 11/08/2018   SpO2 100%   Physical Exam Vitals signs and nursing note reviewed.  Constitutional:      General: She is not in acute distress.    Appearance: She is well-developed. She is not ill-appearing, toxic-appearing or diaphoretic.  HENT:     Head: Normocephalic and atraumatic.     Nose: No congestion or rhinorrhea.     Mouth/Throat:     Pharynx: No oropharyngeal exudate or posterior oropharyngeal erythema.  Eyes:     Conjunctiva/sclera: Conjunctivae normal.  Neck:     Musculoskeletal: Neck supple.  Cardiovascular:     Rate and Rhythm: Normal rate and regular rhythm.     Pulses: Normal pulses.     Heart sounds: Normal heart sounds. No murmur.  Pulmonary:     Effort: Pulmonary effort is normal. No respiratory distress.     Breath sounds: Normal breath sounds. No stridor. No wheezing, rhonchi or rales.  Chest:     Chest wall: No tenderness.  Abdominal:     General: Abdomen is flat.      Palpations: Abdomen is soft.     Tenderness: There is abdominal tenderness.  Skin:    General: Skin is warm and dry.     Capillary Refill: Capillary refill takes less than 2 seconds.     Coloration: Skin is not jaundiced.     Findings: No bruising.  Neurological:     General: No focal deficit present.  Mental Status: She is alert.      ED Treatments / Results  Labs (all labs ordered are listed, but only abnormal results are displayed) Labs Reviewed  CBC WITH DIFFERENTIAL/PLATELET - Abnormal; Notable for the following components:      Result Value   WBC 10.8 (*)    Neutro Abs 8.7 (*)    All other components within normal limits  COMPREHENSIVE METABOLIC PANEL - Abnormal; Notable for the following components:   Total Protein 8.2 (*)    All other components within normal limits  URINE CULTURE  CULTURE, BLOOD (ROUTINE X 2)  LIPASE, BLOOD  LACTIC ACID, PLASMA  URINALYSIS, ROUTINE W REFLEX MICROSCOPIC  I-STAT BETA HCG BLOOD, ED ([facility], [facility], [facility] ONLY)  I-STAT TROPONIN, ED    EKG EKG Interpretation  Date/Time:  Friday November 10 2018 20:41:51 EST Ventricular Rate:  88 PR Interval:    QRS Duration: 91 QT Interval:  376 QTC Calculation: 455 R Axis:   6 Text Interpretation:  Sinus rhythm Low voltage, precordial leads Borderline T abnormalities, diffuse leads When compared to prior, no significant changes seen.  No sTEMI Confirmed by Antony Blackbird [phone]) on 11/10/2018 9:11:48 PM   Radiology No results found.  Procedures Procedures (including critical care time)  Medications Ordered in ED Medications  morphine 4 MG/ML injection 4 mg (4 mg Intravenous Given 11/10/18 2155)  ondansetron (ZOFRAN) injection 4 mg (4 mg Intravenous Given 11/10/18 2155)  sodium chloride 0.9 % bolus 1,000 mL (0 mLs Intravenous Stopped 11/10/18 2258)  alum & mag hydroxide-simeth (MAALOX/MYLANTA) 200-200-20 MG/5ML suspension 30 mL (30 mLs Oral Given 11/10/18 2156)    And  lidocaine (XYLOCAINE) 2 %  viscous mouth solution 15 mL (15 mLs Oral Given 11/10/18 2156)  iohexol (OMNIPAQUE) 300 MG/ML solution 100 mL (100 mLs Intravenous Contrast Given 11/10/18 2342)     Initial Impression / Assessment and Plan / ED Course  I have reviewed the triage vital signs and the nursing notes.  Pertinent labs & imaging results that were available during my care of the patient were reviewed by me and considered in my medical decision making (see chart for details).     SHIRI HODAPP is a 28 y.o. female with a past medical history significant for ulcerative colitis, acid reflux, asthma, anxiety, depression, and bipolar disorder who presents with severe upper abdominal pain and nausea.  Patient reports that she started having abdominal pain today which she describes as extremely severe.  She describes as a 20 out of 10 in severity and is been gradually worsening all day.  She reports a normal bowel movement earlier with no constipation diarrhea or bleeding.  She reports she is had nausea but no vomiting.  She reports has had some chills but is otherwise had no complaints.  No recent urinary symptoms.  No recent trauma.  She describes the pain is across her upper abdomen and is both aching, cramping, and sharp.  She says this pain does not feel exactly like her prior ulcerative colitis pain and she denies any history of pancreatitis, liver problems, or cholecystitis.  She has not taken any medicine for her symptoms.  She is on her menstrual cycle currently.  No other pelvic complaints.  On exam, patient's upper abdomen is tender to palpation.  Patient had audible bowel sounds.  No significant abdominal distention.  No evidence of trauma on abdominal examination.  Lungs clear chest nontender.  Back is nontender.  No CVA tenderness.  Legs nontender.  Patient  resting comfortably with reassuring vital signs on arrival.  Patient will be given pain medicine nausea medicine and a small amount of fluids.  She will have  laboratory testing as well as imaging to further evaluate.  Given her history of ulcerative colitis, she could have an atypical ulcerative colitis flare versus pancreatitis versus diverticulitis versus obstruction versus constipation versus cholecystitis.  Anticipate reassessment after work-up and imaging.  If work-up is reassuring, anticipate discharge home.  12:09 AM Care transferred to Dr. Wyvonnia Dusky while awaiting results of CT scan.  Patient's pain is still severe and a CT scan completed remarkable, would consider right upper quadrant ultrasound however laboratory testing showed no elevation in LFTs and lipase is nonelevated.  Doubt a biliary etiology of her symptoms.  Anticipate following up on the results and reassessment of the patient.  Anticipate discharge if work-up is reassuring.  Final Clinical Impressions(s) / ED Diagnoses   Final diagnoses:  Upper abdominal pain  Nausea  Chills    ED Discharge Orders    None     Clinical Impression: 1. Upper abdominal pain   2. Nausea   3. Chills     Disposition: Awaiting reassessment after imaging results.  Care transferred in stable condition.  This note was prepared with assistance of Systems analyst. Occasional wrong-word or sound-a-like substitutions may have occurred due to the inherent limitations of voice recognition software.     Tegeler, Gwenyth Allegra, MD 11/11/18 [phone]

## 2018-11-10 NOTE — ED Triage Notes (Addendum)
Patient arrived with EMS from [facility] ( [facility]) reports brief syncopal episode this evening with nausea , alert and oriented at arrival , no fever or chills . Pt. stated 2nd day of menstrual period .  CBG=95.

## 2018-11-11 ENCOUNTER — Emergency Department (HOSPITAL_COMMUNITY): Payer: Self-pay

## 2018-11-11 LAB — D-DIMER, QUANTITATIVE: D-Dimer, Quant: 0.75 ug/mL-FEU — ABNORMAL HIGH (ref 0.00–0.50)

## 2018-11-11 MED ORDER — IOPAMIDOL (ISOVUE-370) INJECTION 76%
80.0000 mL | Freq: Once | INTRAVENOUS | Status: AC | PRN
  Administered 2018-11-11: 80 mL via INTRAVENOUS

## 2018-11-11 MED ORDER — IOPAMIDOL (ISOVUE-370) INJECTION 76%
INTRAVENOUS | Status: AC
  Filled 2018-11-11: qty 100

## 2018-11-11 MED ORDER — ONDANSETRON 4 MG PO TBDP: 4.0000 mg | ORAL_TABLET | Freq: Three times a day (TID) | ORAL | 0 refills | Status: DC | PRN

## 2018-11-11 NOTE — ED Notes (Signed)
Patient transported to Ultrasound 

## 2018-11-11 NOTE — Discharge Instructions (Addendum)
Your Testing is reassuring.  Your gallbladder appears normal.  Follow-up with your doctor.  Return to the ED if you develop new or worsening symptoms.

## 2018-11-11 NOTE — ED Provider Notes (Signed)
CT scan pending at time of sign out from Dr. Sherry Ruffing.  Patient with sudden onset of upper abdominal pain with nausea and near syncopal episode.  No vomiting.  CT scan pending.  Labs are reassuring.  CT scan is negative for acute pathology.  LFTs and lipase are normal.  Gallbladder ultrasound is negative.  Patient is tolerating p.o. and ambulatory.  Her abdomen is soft without peritoneal signs.  Given elevated d-dimer and near syncope, CT angiogram was performed which is negative.  Follow-up with PCP.  Return precautions discussed.  BP 136/72 (BP Location: Right Arm)   Pulse 84   Temp 98.6 F (37 C) (Oral)   Resp 16   LMP 11/08/2018   SpO2 100%     Ezequiel Essex, MD 11/11/18 [phone]

## 2018-11-11 NOTE — ED Notes (Addendum)
Pts mother requesting multiple times to be updated with results, hoping they can go home because pt has to be at work in the morning. Pts mother reassured that the Dr would be by to speak with them as soon as possible.

## 2018-11-11 NOTE — ED Notes (Signed)
Patient currently at ultrasound .

## 2018-11-15 LAB — CULTURE, BLOOD (ROUTINE X 2)
Culture: NO GROWTH
Special Requests: ADEQUATE

## 2019-02-28 ENCOUNTER — Other Ambulatory Visit: Payer: Self-pay

## 2019-02-28 ENCOUNTER — Encounter: Payer: Self-pay | Admitting: Internal Medicine

## 2019-02-28 ENCOUNTER — Ambulatory Visit: Payer: Medicaid Other | Admitting: Internal Medicine

## 2019-03-31 ENCOUNTER — Other Ambulatory Visit: Payer: Self-pay | Admitting: *Deleted

## 2019-03-31 DIAGNOSIS — Z20822 Contact with and (suspected) exposure to covid-19: Secondary | ICD-10-CM

## 2019-04-02 NOTE — Addendum Note (Signed)
Addended by: Brigitte Pulse on: 04/02/2019 09:24 AM   Modules accepted: Orders

## 2019-04-09 ENCOUNTER — Telehealth: Payer: Self-pay | Admitting: Internal Medicine

## 2019-04-10 MED ORDER — SULFASALAZINE 500 MG PO TABS: ORAL_TABLET | ORAL | 6 refills | Status: DC

## 2019-04-10 NOTE — Telephone Encounter (Signed)
Sent Sulfasalazine to [facility] on [address]

## 2019-06-21 ENCOUNTER — Other Ambulatory Visit: Payer: Self-pay

## 2019-06-21 ENCOUNTER — Encounter (HOSPITAL_COMMUNITY): Payer: Self-pay

## 2019-06-21 ENCOUNTER — Emergency Department (HOSPITAL_COMMUNITY)
Admission: EM | Admit: 2019-06-21 | Discharge: 2019-06-21 | Disposition: A | Discharge status: Home / Self Care | Payer: BC Managed Care – PPO | Attending: Emergency Medicine | Admitting: Emergency Medicine

## 2019-06-21 ENCOUNTER — Emergency Department (HOSPITAL_COMMUNITY): Payer: BC Managed Care – PPO

## 2019-06-21 DIAGNOSIS — R112 Nausea with vomiting, unspecified: Secondary | ICD-10-CM | POA: Diagnosis not present

## 2019-06-21 DIAGNOSIS — R0981 Nasal congestion: Secondary | ICD-10-CM | POA: Insufficient documentation

## 2019-06-21 DIAGNOSIS — J45909 Unspecified asthma, uncomplicated: Secondary | ICD-10-CM | POA: Diagnosis not present

## 2019-06-21 DIAGNOSIS — R42 Dizziness and giddiness: Secondary | ICD-10-CM | POA: Insufficient documentation

## 2019-06-21 DIAGNOSIS — Z79899 Other long term (current) drug therapy: Secondary | ICD-10-CM | POA: Insufficient documentation

## 2019-06-21 HISTORY — DX: Post-traumatic stress disorder, unspecified: F43.10

## 2019-06-21 HISTORY — DX: Attention-deficit hyperactivity disorder, unspecified type: F90.9

## 2019-06-21 LAB — URINALYSIS, ROUTINE W REFLEX MICROSCOPIC
Bacteria, UA: NONE SEEN
Bilirubin Urine: NEGATIVE
Glucose, UA: NEGATIVE mg/dL
Ketones, ur: NEGATIVE mg/dL
Leukocytes,Ua: NEGATIVE
Nitrite: NEGATIVE
Protein, ur: NEGATIVE mg/dL
Specific Gravity, Urine: 1.024 (ref 1.005–1.030)
pH: 5 (ref 5.0–8.0)

## 2019-06-21 LAB — PREGNANCY, URINE: Preg Test, Ur: NEGATIVE

## 2019-06-21 MED ORDER — ONDANSETRON 4 MG PO TBDP
4.0000 mg | ORAL_TABLET | Freq: Once | ORAL | Status: AC
  Administered 2019-06-21: 4 mg via ORAL
  Filled 2019-06-21: qty 1

## 2019-06-21 MED ORDER — BENZONATATE 100 MG PO CAPS: 100.0000 mg | ORAL_CAPSULE | Freq: Three times a day (TID) | ORAL | 0 refills | Status: DC

## 2019-06-21 MED ORDER — FLUTICASONE PROPIONATE 50 MCG/ACT NA SUSP: 1.0000 | Freq: Every day | NASAL | 0 refills | Status: DC

## 2019-06-21 MED ORDER — ONDANSETRON 4 MG PO TBDP: 4.0000 mg | ORAL_TABLET | Freq: Three times a day (TID) | ORAL | 0 refills | Status: DC | PRN

## 2019-06-21 NOTE — ED Notes (Signed)
Pt refused discharge vital signs

## 2019-06-21 NOTE — Discharge Instructions (Signed)
You are seen in the ER today for congestion, cough, and lightheadedness.  Your work-up was overall reassuring.  Your urine had minimal blood in it, have this rechecked by primary care.  You do not have signs of a UTI.  Your chest x-ray was normal.  WE are sending home with Flonase to help with congestion, Tessalon to help with cough, and Zofran to help with nausea/vomiting.  Please take medicines as prescribed.  We have prescribed you new medication(s) today. Discuss the medications prescribed today with your pharmacist as they can have adverse effects and interactions with your other medicines including over the counter and prescribed medications. Seek medical evaluation if you start to experience new or abnormal symptoms after taking one of these medicines, seek care immediately if you start to experience difficulty breathing, feeling of your throat closing, facial swelling, or rash as these could be indications of a more serious allergic reaction  Please be sure to drink plenty of water and hydrate well with fluids.  Please follow-up with primary care within 1 week.  If you do not have a primary care provider please call the phone number circled in your discharge instructions.  Return to the ER for new or worsening symptoms or any other concerns.

## 2019-06-21 NOTE — ED Provider Notes (Signed)
[facility] DEPT Provider Note   CSN: [id number] Arrival date & time: 06/21/19  1607     History   Chief Complaint Chief Complaint  Patient presents with  . Nasal Congestion  . Emesis    HPI Sydney Deleon is a 28 y.o. female with a hx of bipolar 1 disorder, depression, anxiety, allergies, asthma, & UC who presents to the ED w/ complaints of generally not feeling well for about 2 weeks now. Patient states she has had a variety of sxs that have been coming and going including nasal congestion, ear pressure, scratchy throat, cough productive of clear sputum, & intermittent lightheadedness/dizziness with position changes sometimes. No significant alleviating/aggravating factors. She did vomit twice yesterday but otherwise no nausea/vomiting. No intervention PTA. Denies fever, chills, hematemesis, diarrhea, melena, abdominal pain, pelvic pain, vaginal discharge/bleeding, dysuria, chest pain, dyspnea, headache, numbness, tingling, or focal weakness. LMP 06/01/19     HPI  Past Medical History:  Diagnosis Date  . Acid reflux   . ADHD   . Allergy   . Anxiety   . Arthritis   . Asthma   . Bipolar 1 disorder ([place])   . Chronic bronchitis ([place])   . Depression   . Miscarriage within last 12 months   . PTSD (post-traumatic stress disorder)     Patient Active Problem List   Diagnosis Date Noted  . MDD (major depressive disorder), severe ([place]) 06/14/2018  . MDD (major depressive disorder), recurrent severe, without psychosis ([place])   . Ulcerative colitis ([place]) 05/12/2018  . Diarrhea 04/26/2018  . Generalized abdominal pain 04/26/2018  . Nausea and vomiting 04/26/2018  . Depression   . Bipolar 1 disorder ([place])   . Asthma   . Acid reflux     Past Surgical History:  Procedure Laterality Date  . TONSILECTOMY/ADENOIDECTOMY WITH MYRINGOTOMY       OB History    Gravida  2   Para      Term      Preterm      AB      Living        SAB      TAB      Ectopic      Multiple      Live Births               Home Medications    Prior to Admission medications   Medication Sig Start Date End Date Taking? Authorizing Provider  albuterol (PROVENTIL HFA;VENTOLIN HFA) 108 (90 Base) MCG/ACT inhaler Inhale 2 puffs into the lungs every 4 (four) hours as needed for wheezing or shortness of breath. 10/09/18   Sherwood Gambler, MD  ARIPiprazole (ABILIFY) 5 MG tablet Take 1 tablet (5 mg total) by mouth daily. For mood control 06/26/18   Money, Lowry Ram, FNP  benzonatate (TESSALON) 100 MG capsule Take 1 capsule (100 mg total) by mouth every 8 (eight) hours. Patient not taking: Reported on 11/10/2018 10/30/18   Hedges, Dellis Filbert, PA-C  citalopram (CELEXA) 40 MG tablet Take 1 tablet (40 mg total) by mouth daily. For mood control 06/26/18   Money, Lowry Ram, FNP  folic acid (FOLVITE) 025 MCG tablet Take 400 mcg by mouth daily.    [provider]  hydrOXYzine (ATARAX/VISTARIL) 25 MG tablet Take 1 tablet (25 mg total) by mouth 3 (three) times daily as needed for anxiety. 06/26/18   Money, Lowry Ram, FNP  ondansetron (ZOFRAN ODT) 4 MG disintegrating tablet Take 1 tablet (4 mg total) by  mouth every 8 (eight) hours as needed for nausea or vomiting. 11/11/18   Rancour, Annie Main, MD  Probiotic Product ([place]) Take 1 capsule by mouth daily.     [provider]  raNITIdine HCl (ZANTAC PO) Take 75 mg by mouth 2 (two) times daily.     [provider]  sulfaSALAzine (AZULFIDINE) 500 MG tablet Take 2 tablets by mouth twice daily with food. 04/10/19   Irene Shipper, MD  traZODone (DESYREL) 50 MG tablet Take 1 tablet (50 mg total) by mouth at bedtime as needed for sleep. 06/26/18   Money, Lowry Ram, FNP    Family History Family History  Problem Relation Age of Onset  . Hypertension Mother   . Throat cancer Paternal Grandfather   . Colon cancer Cousin        2 cousins  . Breast cancer Cousin   . Rectal cancer Cousin   .  Breast cancer Cousin   . Esophageal cancer Neg Hx   . Stomach cancer Neg Hx     Social History Social History   Tobacco Use  . Smoking status: Never Smoker  . Smokeless tobacco: Never Used  Substance Use Topics  . Alcohol use: No  . Drug use: No     Allergies   Prednisone   Review of Systems Review of Systems  Constitutional: Negative for chills and fever.  HENT: Positive for congestion, ear pain and sore throat (scratchy).   Respiratory: Positive for cough. Negative for shortness of breath.   Cardiovascular: Negative for chest pain and leg swelling.  Gastrointestinal: Positive for vomiting (x2). Negative for abdominal pain, blood in stool, constipation and diarrhea.  Genitourinary: Negative for dysuria, vaginal bleeding and vaginal discharge.  Neurological: Positive for dizziness and light-headedness. Negative for syncope.  All other systems reviewed and are negative.    Physical Exam Updated Vital Signs BP (!) 153/85 (BP Location: Left Arm)   Pulse (!) 101   Temp 98.6 F (37 C) (Oral)   Resp 18   Ht 5' 5"  (1.651 m)   Wt 126 kg   LMP 06/01/2019   SpO2 95%   BMI 46.23 kg/m   Physical Exam Vitals signs and nursing note reviewed.  Constitutional:      General: She is not in acute distress.    Appearance: She is well-developed.  HENT:     Head: Normocephalic and atraumatic.     Right Ear: Ear canal normal. Tympanic membrane is not perforated, erythematous, retracted or bulging.     Left Ear: Ear canal normal. Tympanic membrane is not perforated, erythematous, retracted or bulging.     Ears:     Comments: Some fullness noted to bilateral TMs.  No mastoid erythema/swelling/tenderness.     Nose: Congestion present.     Right Sinus: No maxillary sinus tenderness or frontal sinus tenderness.     Left Sinus: No maxillary sinus tenderness or frontal sinus tenderness.     Mouth/Throat:     Pharynx: Uvula midline. No oropharyngeal exudate or posterior oropharyngeal  erythema.     Comments: Posterior oropharynx is symmetric appearing. Patient tolerating own secretions without difficulty. No trismus. No drooling. No hot potato voice. No swelling beneath the tongue, submandibular compartment is soft.  Eyes:     General:        Right eye: No discharge.        Left eye: No discharge.     Extraocular Movements: Extraocular movements intact.     Right  eye: No nystagmus.     Left eye: No nystagmus.     Conjunctiva/sclera: Conjunctivae normal.     Pupils: Pupils are equal, round, and reactive to light.  Neck:     Musculoskeletal: Normal range of motion and neck supple. No edema or neck rigidity.  Cardiovascular:     Rate and Rhythm: Normal rate and regular rhythm.     Heart sounds: No murmur.  Pulmonary:     Effort: Pulmonary effort is normal. No respiratory distress.     Breath sounds: Normal breath sounds. No wheezing, rhonchi or rales.  Abdominal:     General: There is no distension.     Palpations: Abdomen is soft.     Tenderness: There is no abdominal tenderness.  Lymphadenopathy:     Cervical: No cervical adenopathy.  Skin:    General: Skin is warm and dry.     Findings: No rash.  Neurological:     Mental Status: She is alert.     Comments: Alert. Clear speech. CN III-XII grossly intact. Sensation grossly intact w/ symmetric strength x 4. Ambulatory with steady gait. Normal finger to nose.   Psychiatric:        Behavior: Behavior normal.    ED Treatments / Results  Labs (all labs ordered are listed, but only abnormal results are displayed) Labs Reviewed  URINALYSIS, ROUTINE W REFLEX MICROSCOPIC - Abnormal; Notable for the following components:      Result Value   Hgb urine dipstick SMALL (*)    All other components within normal limits  PREGNANCY, URINE    EKG None  Radiology Dg Chest Port 1 View  Result Date: 06/21/2019 CLINICAL DATA:  Nasal congestion over the last 2 weeks. Vomiting yesterday. EXAM: PORTABLE CHEST 1 VIEW  COMPARISON:  10/09/2018 FINDINGS: The heart size and mediastinal contours are within normal limits. Both lungs are clear. The visualized skeletal structures are unremarkable. IMPRESSION: No active disease. Electronically Signed   By: Nelson Chimes M.D.   On: 06/21/2019 20:43    Procedures Procedures (including critical care time)  Medications Ordered in ED Medications  ondansetron (ZOFRAN-ODT) disintegrating tablet 4 mg (4 mg Oral Given 06/21/19 2040)     Initial Impression / Assessment and Plan / ED Course  I have reviewed the triage vital signs and the nursing notes.  Pertinent labs & imaging results that were available during my care of the patient were reviewed by me and considered in my medical decision making (see chart for details).   Patient presents with complaints of URI symptoms, cough, and intermittent lightheadedness/dizziness with position changes.  She is nontoxic-appearing, no apparent distress, vitals with elevated BP, doubt HTN emergency.  No sinus tenderness on exam, afebrile, doubt acute bacterial sinusitis.  Exam not consistent with AOM, AOE, or mastoiditis.  Oropharynx is clear.  Lungs clear to auscultation, chest x-ray without infiltrate, doubt pneumonia.  Nasal congestion noted, TMs with some fullness present-there may be a degree of eustachian tube dysfunction leading to some of her symptoms.  Regarding her intermittent lightheadedness/dizziness, no focal neurologic deficits, ambulatory with steady gait, do not suspect acute central neurologic cause.  Urinalysis without UTI or significant findings of dehydration.  Pregnancy test negative.  She is tolerating p.o. without difficulty prior to Zofran in the ER.  Encouraged oral hydration, will provide prescription for Flonase, zofran, and Tessalon, PCP follow-up. I discussed results, treatment plan, need for follow-up, and return precautions with the patient. Provided opportunity for questions, patient confirmed understanding and  is in agreement with plan.   Final Clinical Impressions(s) / ED Diagnoses   Final diagnoses:  Nasal congestion    ED Discharge Orders         Ordered    fluticasone (FLONASE) 50 MCG/ACT nasal spray  Daily     06/21/19 2134    benzonatate (TESSALON) 100 MG capsule  Every 8 hours     06/21/19 2134    ondansetron (ZOFRAN ODT) 4 MG disintegrating tablet  Every 8 hours PRN     06/21/19 2134           Amaryllis Dyke, PA-C 06/21/19 2135    Blanchie Dessert, MD 06/25/19 1012

## 2019-06-21 NOTE — ED Triage Notes (Signed)
Patient c/o nasal congestion x 2 weeks and emesis x 2 yesterday only. patient states she is dizzy at times.

## 2019-07-16 ENCOUNTER — Ambulatory Visit: Payer: BC Managed Care – PPO | Admitting: Nurse Practitioner

## 2019-07-20 ENCOUNTER — Telehealth: Payer: Self-pay | Admitting: Nurse Practitioner

## 2019-07-20 NOTE — Telephone Encounter (Signed)
Questions for Screening COVID-19  Symptom onset: n/a  Travel or Contacts: no  During this illness, did/does the patient experience any of the following symptoms? Fever >100.77F []   Yes [x]   No []   Unknown Subjective fever (felt feverish) []   Yes [x]   No []   Unknown Chills []   Yes [x]   No []   Unknown Muscle aches (myalgia) []   Yes [x]   No []   Unknown Runny nose (rhinorrhea) []   Yes [x]   No []   Unknown Sore throat []   Yes [x]   No []   Unknown Cough (new onset or worsening of chronic cough) []   Yes [x]   No []   Unknown Shortness of breath (dyspnea) []   Yes [x]   No []   Unknown Nausea or vomiting []   Yes [x]   No []   Unknown Headache []   Yes [x]   No []   Unknown Abdominal pain  []   Yes [x]   No []   Unknown Diarrhea (?3 loose/looser than normal stools/24hr period) []   Yes [x]   No []   Unknown Other, specify:

## 2019-07-23 ENCOUNTER — Other Ambulatory Visit: Payer: Self-pay

## 2019-07-23 ENCOUNTER — Ambulatory Visit (INDEPENDENT_AMBULATORY_CARE_PROVIDER_SITE_OTHER): Payer: BC Managed Care – PPO | Admitting: Nurse Practitioner

## 2019-07-23 ENCOUNTER — Encounter: Payer: Self-pay | Admitting: Nurse Practitioner

## 2019-07-23 VITALS — BP 120/70 | HR 108 | Temp 98.0°F | Ht 65.0 in | Wt 284.4 lb

## 2019-07-23 DIAGNOSIS — F319 Bipolar disorder, unspecified: Secondary | ICD-10-CM | POA: Diagnosis not present

## 2019-07-23 DIAGNOSIS — N911 Secondary amenorrhea: Secondary | ICD-10-CM

## 2019-07-23 DIAGNOSIS — Z114 Encounter for screening for human immunodeficiency virus [HIV]: Secondary | ICD-10-CM | POA: Diagnosis not present

## 2019-07-23 DIAGNOSIS — F339 Major depressive disorder, recurrent, unspecified: Secondary | ICD-10-CM

## 2019-07-23 NOTE — Patient Instructions (Addendum)
Please inform psychiatry and GI about pregnancy state ASAP.  You will be contacted to schedule appt with OB/GYN.  Start prenatal multivitamin 1tab daily.  Go to lab for blood draw.   Eating Plan for Pregnant Women While you are pregnant, your body requires additional nutrition to help support your growing baby. You also have a higher need for some vitamins and minerals, such as folic acid, calcium, iron, and vitamin D. Eating a healthy, well-balanced diet is very important for your health and your baby's health. Your need for extra calories varies for the three 7-monthsegments of your pregnancy (trimesters). For most women, it is recommended to consume:  150 extra calories a day during the first trimester.  300 extra calories a day during the second trimester.  300 extra calories a day during the third trimester. What are tips for following this plan?   Do not try to lose weight or go on a diet during pregnancy.  Limit your overall intake of foods that have "empty calories." These are foods that have little nutritional value, such as sweets, desserts, candies, and sugar-sweetened beverages.  Eat a variety of foods (especially fruits and vegetables) to get a full range of vitamins and minerals.  Take a prenatal vitamin to help meet your additional vitamin and mineral needs during pregnancy, specifically for folic acid, iron, calcium, and vitamin D.  Remember to stay active. Ask your health care provider what types of exercise and activities are safe for you.  Practice good food safety and cleanliness. Wash your hands before you eat and after you prepare raw meat. Wash all fruits and vegetables well before peeling or eating. Taking these actions can help to prevent food-borne illnesses that can be very dangerous to your baby, such as listeriosis. Ask your health care provider for more information about listeriosis. What does 150 extra calories look like? Healthy options that provide  150 extra calories each day could be any of the following:  6-8 oz (170-230 g) of plain low-fat yogurt with  cup of berries.  1 apple with 2 teaspoons (11 g) of peanut butter.  Cut-up vegetables with  cup (60 g) of hummus.  8 oz (230 mL) or 1 cup of low-fat chocolate milk.  1 stick of string cheese with 1 medium orange.  1 peanut butter and jelly sandwich that is made with one slice of whole-wheat bread and 1 tsp (5 g) of peanut butter. For 300 extra calories, you could eat two of those healthy options each day. What is a healthy amount of weight to gain? The right amount of weight gain for you is based on your BMI before you became pregnant. If your BMI:  Was less than 18 (underweight), you should gain 28-40 lb (13-18 kg).  Was 18-24.9 (normal), you should gain 25-35 lb (11-16 kg).  Was 25-29.9 (overweight), you should gain 15-25 lb (7-11 kg).  Was 30 or greater (obese), you should gain 11-20 lb (5-9 kg). What if I am having twins or multiples? Generally, if you are carrying twins or multiples:  You may need to eat 300-600 extra calories a day.  The recommended range for total weight gain is 25-54 lb (11-25 kg), depending on your BMI before pregnancy.  Talk with your health care provider to find out about nutritional needs, weight gain, and exercise that is right for you. What foods can I eat?  Grains All grains. Choose whole grains, such as whole-wheat bread, oatmeal, or brown rice. Vegetables All vegetables.  Eat a variety of colors and types of vegetables. Remember to wash your vegetables well before peeling or eating. Fruits All fruits. Eat a variety of colors and types of fruit. Remember to wash your fruits well before peeling or eating. Meats and other protein foods Lean meats, including chicken, [place], fish, and lean cuts of beef, veal, or pork. If you eat fish or seafood, choose options that are higher in omega-3 fatty acids and lower in mercury, such as salmon,  herring, mussels, trout, sardines, pollock, shrimp, crab, and lobster. Tofu. Tempeh. Beans. Eggs. Peanut butter and other nut butters. Make sure that all meats, poultry, and eggs are cooked to food-safe temperatures or "well-done." Two or more servings of fish are recommended each week in order to get the most benefits from omega-3 fatty acids that are found in seafood. Choose fish that are lower in mercury. You can find more information online:  [employer]Gaming.ch Dairy Pasteurized milk and milk alternatives (such as almond milk). Pasteurized yogurt and pasteurized cheese. Cottage cheese. Sour cream. Beverages Water. Juices that contain 100% fruit juice or vegetable juice. Caffeine-free teas and decaffeinated coffee. Drinks that contain caffeine are okay to drink, but it is better to avoid caffeine. Keep your total caffeine intake to less than 200 mg each day (which is 12 oz or 355 mL of coffee, tea, or soda) or the limit as told by your health care provider. Fats and oils Fats and oils are okay to include in moderation. Sweets and desserts Sweets and desserts are okay to include in moderation. Seasoning and other foods All pasteurized condiments. The items listed above may not be a complete list of recommended foods and beverages. Contact your dietitian for more options. The items listed above may not be a complete list of foods and beverages [you/your child] can eat. Contact a dietitian for more information. What foods are not recommended? Vegetables Raw (unpasteurized) vegetable juices. Fruits Unpasteurized fruit juices. Meats and other protein foods Lunch meats, bologna, hot dogs, or other deli meats. (If you must eat those meats, reheat them until they are steaming hot.) Refrigerated pat, meat spreads from a meat counter, smoked seafood that is found in the refrigerated section of a store. Raw or undercooked meats, poultry, and eggs. Raw fish, such as sushi or sashimi. Fish that have high  mercury content, such as tilefish, shark, swordfish, and king mackerel. To learn more about mercury in fish, talk with your health care provider or look for online resources, such as:  [employer]Gaming.ch Dairy Raw (unpasteurized) milk and any foods that have raw milk in them. Soft cheeses, such as feta, queso blanco, queso fresco, Brie, Camembert cheeses, blue-veined cheeses, and Panela cheese (unless it is made with pasteurized milk, which must be stated on the label). Beverages Alcohol. Sugar-sweetened beverages, such as sodas, teas, or energy drinks. Seasoning and other foods Homemade fermented foods and drinks, such as pickles, sauerkraut, or kombucha drinks. (Store-bought pasteurized versions of these are okay.) Salads that are made in a store or deli, such as ham salad, chicken salad, egg salad, tuna salad, and seafood salad. The items listed above may not be a complete list of foods and beverages to avoid. Contact your dietitian for more information. The items listed above may not be a complete list of foods and beverages [you/your child] should avoid. Contact a dietitian for more information. Where to find more information To calculate the number of calories you need based on your height, weight, and activity level, you can use  an Freight forwarder such as:  MobileTransition.ch To calculate how much weight you should gain during pregnancy, you can use an online pregnancy weight gain calculator such as:  StreamingFood.com.cy Summary  While you are pregnant, your body requires additional nutrition to help support your growing baby.  Eat a variety of foods, especially fruits and vegetables to get a full range of vitamins and minerals.  Practice good food safety and cleanliness. Wash your hands before you eat and after you prepare raw meat. Wash all fruits and vegetables well before peeling or eating. Taking these actions can help to prevent  food-borne illnesses, such as listeriosis, that can be very dangerous to your baby.  Do not eat raw meat or fish. Do not eat fish that have high mercury content, such as tilefish, shark, swordfish, and king mackerel. Do not eat unpasteurized (raw) dairy.  Take a prenatal vitamin to help meet your additional vitamin and mineral needs during pregnancy, specifically for folic acid, iron, calcium, and vitamin D. This information is not intended to replace advice given to you by your health care provider. Make sure you discuss any questions you have with your health care provider. Document Released: 07/19/2014 Document Revised: 01/25/2019 Document Reviewed: 07/01/2017 Elsevier Patient Education  2020 [employer].
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
## 2019-07-24 LAB — BASIC METABOLIC PANEL
BUN: 9 mg/dL (ref 6–23)
CO2: 19 mEq/L (ref 19–32)
Calcium: 9.6 mg/dL (ref 8.4–10.5)
Chloride: 107 mEq/L (ref 96–112)
Creatinine, Ser: 0.73 mg/dL (ref 0.40–1.20)
GFR: 113.26 mL/min (ref >60.00)
Glucose, Bld: 104 mg/dL — ABNORMAL HIGH (ref 70–99)
Potassium: 3.7 mEq/L (ref 3.5–5.1)
Sodium: 137 mEq/L (ref 135–145)

## 2019-07-24 LAB — TSH: TSH: 0.74 u[IU]/mL (ref 0.35–4.50)

## 2019-07-25 ENCOUNTER — Encounter: Payer: Self-pay | Admitting: Nurse Practitioner

## 2019-07-26 ENCOUNTER — Telehealth (HOSPITAL_COMMUNITY): Payer: Self-pay | Admitting: Professional

## 2019-07-26 NOTE — Telephone Encounter (Signed)
Cln called to f/u on ref. Again. Cln orients pt to PHP. Pt reports she is pregnant, has a hx of BiPolar, ADHD, Dep, PTSD, and anx. Pt denies providers for the last 3 years. Pt denies SI/HI/AVH. No decrease in ADLs. Pt reports her PCP ref. Her for psychiatrist because she is preg. And PCP thinks she may need to come off some meds for safety of baby. Pt wants to continue to ref. For psychiatrist. Not appropriate for PHP at this time. Will send back to [employer].

## 2019-07-30 ENCOUNTER — Other Ambulatory Visit (INDEPENDENT_AMBULATORY_CARE_PROVIDER_SITE_OTHER): Payer: BC Managed Care – PPO

## 2019-07-30 DIAGNOSIS — F319 Bipolar disorder, unspecified: Secondary | ICD-10-CM

## 2019-07-30 DIAGNOSIS — F339 Major depressive disorder, recurrent, unspecified: Secondary | ICD-10-CM

## 2019-07-30 DIAGNOSIS — Z8759 Personal history of other complications of pregnancy, childbirth and the puerperium: Secondary | ICD-10-CM

## 2019-07-30 DIAGNOSIS — Z3A08 8 weeks gestation of pregnancy: Secondary | ICD-10-CM

## 2019-07-30 DIAGNOSIS — Z114 Encounter for screening for human immunodeficiency virus [HIV]: Secondary | ICD-10-CM

## 2019-07-30 LAB — CBC
HCT: 36.5 % (ref 36.0–46.0)
Hemoglobin: 12.2 g/dL (ref 12.0–15.0)
MCHC: 33.5 g/dL (ref 30.0–36.0)
MCV: 91.9 fl (ref 78.0–100.0)
Platelets: 323 10*3/uL (ref 150.0–400.0)
RBC: 3.97 Mil/uL (ref 3.87–5.11)
RDW: 15 % (ref 11.5–15.5)
WBC: 7.7 10*3/uL (ref 4.0–10.5)

## 2019-07-31 ENCOUNTER — Encounter: Payer: Self-pay | Admitting: Nurse Practitioner

## 2019-07-31 LAB — HIV ANTIBODY (ROUTINE TESTING W REFLEX): HIV 1&2 Ab, 4th Generation: NONREACTIVE

## 2019-07-31 LAB — HCG, SERUM, QUALITATIVE: Preg, Serum: POSITIVE — AB

## 2019-08-16 ENCOUNTER — Ambulatory Visit: Payer: BC Managed Care – PPO | Admitting: Gastroenterology

## 2019-08-23 ENCOUNTER — Other Ambulatory Visit: Payer: Self-pay

## 2019-08-23 DIAGNOSIS — Z20822 Contact with and (suspected) exposure to covid-19: Secondary | ICD-10-CM

## 2019-08-24 LAB — NOVEL CORONAVIRUS, NAA: SARS-CoV-2, NAA: NOT DETECTED

## 2019-08-27 ENCOUNTER — Other Ambulatory Visit: Payer: Self-pay

## 2019-08-27 ENCOUNTER — Encounter: Payer: Self-pay | Admitting: Psychiatry

## 2019-08-27 ENCOUNTER — Ambulatory Visit (INDEPENDENT_AMBULATORY_CARE_PROVIDER_SITE_OTHER): Payer: BC Managed Care – PPO | Admitting: Psychiatry

## 2019-08-27 DIAGNOSIS — F3342 Major depressive disorder, recurrent, in full remission: Secondary | ICD-10-CM | POA: Insufficient documentation

## 2019-08-27 MED ORDER — CITALOPRAM HYDROBROMIDE 40 MG PO TABS: 40.0000 mg | ORAL_TABLET | Freq: Every day | ORAL | 0 refills | Status: AC

## 2019-08-27 NOTE — Progress Notes (Signed)
Psychiatric Initial Adult Assessment   I connected with  [name] on 08/27/19 by a video enabled telemedicine application and verified that I am speaking with the correct person using two identifiers.   I discussed the limitations of evaluation and management by telemedicine. The patient expressed understanding and agreed to proceed.    Patient Identification: Sydney Deleon MRN:  [id number] Date of Evaluation:  08/27/2019   Referral Source: Ob/Gyn  Ms. Nche, NP  Chief Complaint:   " Celexa has really been helping me."  Visit Diagnosis:    ICD-10-CM   1. MDD (major depressive disorder), recurrent, in full remission ([place])  F33.42 citalopram (CELEXA) 40 MG tablet    History of Present Illness: This is a 28 year old female currently pregnant in her first trimester was referred for psychiatric evaluation by her OB/GYN.  Patient reported that in 2019 she had to be hospitalized to [facility] following a depressive episode while she was on prednisone for ulcerative colitis.  Patient reported that she was discharged on Celexa, Abilify, trazodone.  She stated that after some time she realized that Celexa was really helping her and she decided that she will never take steroids again due to the adverse reaction she had with them. Patient reported that Celexa helps her with depression, energy levels and anxiety.  She has been sleeping fine and eating okay.  She is happy to find out that she is pregnant.  She is also scheduled to start a new job.  She feels excited and hopeful for the future. Patient was explained that various body literature have differing views of patient's being on medications like SSRIs in first trimester.  Based on her evaluation, it seems like the benefits of continuing SSRI Celexa outweigh the risks in her case.  Patient denied any concerns.  She does not think she needs Abilify anymore that was mainly started due to her reaction and behavioral changes due to  steroid. She denied any symptoms just of of hypomania, mania or psychosis. Denied any suicidal ideations or self-injurious behaviors.  Associated Signs/Symptoms: Depression Symptoms:  See HPI (Hypo) Manic Symptoms:  Denied Anxiety Symptoms:  Denied Psychotic Symptoms:  Denied PTSD Symptoms: Negative  Past Psychiatric History: Hx of MDD, psychiatric hospitalization in august 2-19 following suicidal ideations  Previous Psychotropic Medications: Yes   Substance Abuse History in the last 12 months:  No.  Consequences of Substance Abuse: Negative  Past Medical History:  Past Medical History:  Diagnosis Date  . Acid reflux   . ADHD   . Allergy   . Anxiety   . Arthritis   . Asthma   . Bipolar 1 disorder ([place])   . Chronic bronchitis ([place])   . Depression   . Miscarriage within last 12 months   . PTSD (post-traumatic stress disorder)     Past Surgical History:  Procedure Laterality Date  . TONSILECTOMY/ADENOIDECTOMY WITH MYRINGOTOMY      Family Psychiatric History: Mom- depression  Family History:  Family History  Problem Relation Age of Onset  . Hypertension Mother   . Depression Mother   . Throat cancer Paternal Grandfather   . Colon cancer Cousin        2 cousins  . Breast cancer Cousin   . Rectal cancer Cousin   . Breast cancer Cousin   . Esophageal cancer Neg Hx   . Stomach cancer Neg Hx     Social History:   Social History   Socioeconomic History  . Marital status:  Single    Spouse name: Not on file  . Number of children: Not on file  . Years of education: Not on file  . Highest education level: Not on file  Occupational History  . Not on file  Social Needs  . Financial resource strain: Not on file  . Food insecurity    Worry: Not on file    Inability: Not on file  . Transportation needs    Medical: Not on file    Non-medical: Not on file  Tobacco Use  . Smoking status: Never Smoker  . Smokeless tobacco: Never Used  Substance and Sexual  Activity  . Alcohol use: No  . Drug use: No  . Sexual activity: Yes    Birth control/protection: None    Comment: on period now  Lifestyle  . Physical activity    Days per week: Not on file    Minutes per session: Not on file  . Stress: Not on file  Relationships  . Social Herbalist on phone: Not on file    Gets together: Not on file    Attends religious service: Not on file    Active member of club or organization: Not on file    Attends meetings of clubs or organizations: Not on file    Relationship status: Not on file  Other Topics Concern  . Not on file  Social History Narrative  . Not on file    Additional Social History: Lives with mom, starting new job soon  Allergies:   Allergies  Allergen Reactions  . Prednisone Other (See Comments)    Suicidal thoughts, tremors, face paralysis, homicidal thoughts,caused mental health issues    Metabolic Disorder Labs: No results found for: HGBA1C, MPG No results found for: PROLACTIN No results found for: CHOL, TRIG, HDL, CHOLHDL, VLDL, LDLCALC Lab Results  Component Value Date   TSH 0.74 07/23/2019    Therapeutic Level Labs: No results found for: LITHIUM No results found for: CBMZ No results found for: VALPROATE  Current Medications: Current Outpatient Medications  Medication Sig Dispense Refill  . albuterol (PROVENTIL HFA;VENTOLIN HFA) 108 (90 Base) MCG/ACT inhaler Inhale 2 puffs into the lungs every 4 (four) hours as needed for wheezing or shortness of breath. 1 Inhaler 0  . benzonatate (TESSALON) 100 MG capsule Take 1 capsule (100 mg total) by mouth every 8 (eight) hours. 21 capsule 0  . citalopram (CELEXA) 40 MG tablet Take 1 tablet (40 mg total) by mouth daily. 90 tablet 0  . fluticasone (FLONASE) 50 MCG/ACT nasal spray Place 1 spray into both nostrils daily. 16 g 0  . folic acid (FOLVITE) 109 MCG tablet Take 400 mcg by mouth daily.    . ondansetron (ZOFRAN ODT) 4 MG disintegrating tablet Take 1 tablet  (4 mg total) by mouth every 8 (eight) hours as needed for nausea or vomiting. 5 tablet 0  . Probiotic Product (PHILLIPS COLON HEALTH PO) Take 1 capsule by mouth daily.     . raNITIdine HCl (ZANTAC PO) Take 75 mg by mouth 2 (two) times daily.     Marland Kitchen sulfaSALAzine (AZULFIDINE) 500 MG tablet Take 2 tablets by mouth twice daily with food. 120 tablet 6   No current facility-administered medications for this visit.     Musculoskeletal: Strength & Muscle Tone: unable to assess due to telemed visit Gait & Station: unable to assess due to telemed visit Patient leans: unable to assess due to telemed visit  Psychiatric Specialty Exam: ROS  There were no vitals taken for this visit.There is no height or weight on file to calculate BMI.  General Appearance: Fairly Groomed  Eye Contact:  Good  Speech:  Clear and Coherent and Normal Rate  Volume:  Normal  Mood:  Euthymic  Affect:  Appropriate  Thought Process:  Goal Directed, Linear and Descriptions of Associations: Intact  Orientation:  Full (Time, Place, and Person)  Thought Content:  Logical  Suicidal Thoughts:  No  Homicidal Thoughts:  No  Memory:  Negative  Judgement:  Fair  Insight:  Fair  Psychomotor Activity:  Normal  Concentration:  Concentration: Good and Attention Span: Good  Recall:  Good  Fund of Knowledge:Good  Language: Good  Akathisia:  Negative  Handed:  Right  AIMS (if indicated): not done  Assets:  Communication Skills Desire for Improvement Financial Resources/Insurance Social Support Talents/Skills Transportation Vocational/Educational  ADL's:  Intact  Cognition: WNL  Sleep:  Good   Screenings: AIMS     Admission (Discharged) from 06/14/2018 in [facility]  AIMS Total Score  0    AUDIT     Admission (Discharged) from 06/14/2018 in [facility]  Alcohol Use Disorder Identification Test Final Score (AUDIT)  0    PHQ2-9     Office Visit from  07/23/2019 in [facility]  PHQ-2 Total Score  0      Assessment and Plan: 28 year old female with history of depression now stable on Celexa 40 mg seen during first trimester pregnancy.  The risks versus benefits of continuing SSRI during pregnancy were discussed, in her case the benefits of weight the risks so decision to continue Celexa 40 mg daily was made.  Patient is starting a new job and is looking forward to all the new events in her life.  1. MDD (major depressive disorder), recurrent, in full remission ([place])  - Continue citalopram (CELEXA) 40 MG tablet; Take 1 tablet (40 mg total) by mouth daily.  Dispense: 90 tablet; Refill: 0  F/up in 2 months.  N[place] Crane, MD 11/9/20201:01 PM

## 2019-10-15 ENCOUNTER — Encounter (HOSPITAL_COMMUNITY): Payer: Self-pay | Admitting: Obstetrics and Gynecology

## 2019-10-15 ENCOUNTER — Other Ambulatory Visit: Payer: Self-pay

## 2019-10-15 ENCOUNTER — Inpatient Hospital Stay (HOSPITAL_COMMUNITY)
Admission: AD | Admit: 2019-10-15 | Discharge: 2019-10-15 | Disposition: A | Discharge status: Home / Self Care | Payer: Medicaid Other | Attending: Obstetrics and Gynecology | Admitting: Obstetrics and Gynecology

## 2019-10-15 DIAGNOSIS — Z888 Allergy status to other drugs, medicaments and biological substances status: Secondary | ICD-10-CM | POA: Insufficient documentation

## 2019-10-15 DIAGNOSIS — K5190 Ulcerative colitis, unspecified, without complications: Secondary | ICD-10-CM | POA: Insufficient documentation

## 2019-10-15 DIAGNOSIS — F319 Bipolar disorder, unspecified: Secondary | ICD-10-CM | POA: Insufficient documentation

## 2019-10-15 DIAGNOSIS — J45909 Unspecified asthma, uncomplicated: Secondary | ICD-10-CM | POA: Insufficient documentation

## 2019-10-15 DIAGNOSIS — Z818 Family history of other mental and behavioral disorders: Secondary | ICD-10-CM | POA: Insufficient documentation

## 2019-10-15 DIAGNOSIS — O99612 Diseases of the digestive system complicating pregnancy, second trimester: Secondary | ICD-10-CM | POA: Insufficient documentation

## 2019-10-15 DIAGNOSIS — O99892 Other specified diseases and conditions complicating childbirth: Secondary | ICD-10-CM | POA: Insufficient documentation

## 2019-10-15 DIAGNOSIS — R109 Unspecified abdominal pain: Secondary | ICD-10-CM

## 2019-10-15 DIAGNOSIS — N949 Unspecified condition associated with female genital organs and menstrual cycle: Secondary | ICD-10-CM

## 2019-10-15 DIAGNOSIS — Z3A19 19 weeks gestation of pregnancy: Secondary | ICD-10-CM | POA: Insufficient documentation

## 2019-10-15 DIAGNOSIS — O26892 Other specified pregnancy related conditions, second trimester: Secondary | ICD-10-CM

## 2019-10-15 DIAGNOSIS — Z79899 Other long term (current) drug therapy: Secondary | ICD-10-CM | POA: Insufficient documentation

## 2019-10-15 DIAGNOSIS — M791 Myalgia, unspecified site: Secondary | ICD-10-CM | POA: Insufficient documentation

## 2019-10-15 DIAGNOSIS — O99342 Other mental disorders complicating pregnancy, second trimester: Secondary | ICD-10-CM | POA: Insufficient documentation

## 2019-10-15 DIAGNOSIS — K219 Gastro-esophageal reflux disease without esophagitis: Secondary | ICD-10-CM | POA: Insufficient documentation

## 2019-10-15 DIAGNOSIS — O99512 Diseases of the respiratory system complicating pregnancy, second trimester: Secondary | ICD-10-CM | POA: Insufficient documentation

## 2019-10-15 HISTORY — DX: Ulcerative colitis, unspecified, without complications: K51.90

## 2019-10-15 HISTORY — DX: Bipolar disorder, unspecified: F31.9

## 2019-10-15 LAB — URINALYSIS, ROUTINE W REFLEX MICROSCOPIC
Bilirubin Urine: NEGATIVE
Glucose, UA: NEGATIVE mg/dL
Hgb urine dipstick: NEGATIVE
Ketones, ur: NEGATIVE mg/dL
Leukocytes,Ua: NEGATIVE
Nitrite: NEGATIVE
Protein, ur: NEGATIVE mg/dL
Specific Gravity, Urine: 1.02 (ref 1.005–1.030)
pH: 6 (ref 5.0–8.0)

## 2019-10-15 LAB — WET PREP, GENITAL
Clue Cells Wet Prep HPF POC: NONE SEEN
Sperm: NONE SEEN
Trich, Wet Prep: NONE SEEN
WBC, Wet Prep HPF POC: NONE SEEN
Yeast Wet Prep HPF POC: NONE SEEN

## 2019-10-15 LAB — CBC
HCT: 36 % (ref 36.0–46.0)
Hemoglobin: 12.4 g/dL (ref 12.0–15.0)
MCH: 31.3 pg (ref 26.0–34.0)
MCHC: 34.4 g/dL (ref 30.0–36.0)
MCV: 90.9 fL (ref 80.0–100.0)
Platelets: 265 10*3/uL (ref 150–400)
RBC: 3.96 MIL/uL (ref 3.87–5.11)
RDW: 14.5 % (ref 11.5–15.5)
WBC: 11.9 10*3/uL — ABNORMAL HIGH (ref 4.0–10.5)
nRBC: 0 % (ref 0.0–0.2)

## 2019-10-15 LAB — HCG, QUANTITATIVE, PREGNANCY: hCG, Beta Chain, Quant, S: 11245 m[IU]/mL — ABNORMAL HIGH (ref <5)

## 2019-10-15 LAB — POCT PREGNANCY, URINE: Preg Test, Ur: POSITIVE — AB

## 2019-10-15 NOTE — MAU Note (Signed)
Pelvic pain, pressure, and back pain -started this afternoon- getting out of shower 6:30pm.  No leaking. No bleeding. Has started feeling baby movement over the last few days like flutters. Stopped care at [facility] at 9 weeks due to changed jobs and insurance change. Has a new job but no insurance yet.
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
## 2019-10-15 NOTE — Discharge Instructions (Signed)
Prenatal Care Providers           Center for [place] @ [facility]   Phone: [phone]  Center for [place] @ [place]   Phone: [place] [facility]       Phone: [phone]            Center for [place] @ [place]     Phone: [place] for [place] @ [employer]   Phone: [place] for [place] @ [facility]  Phone: [place] for [place] @ [facility] Phone: [phone]      Round Ligament Pain During Pregnancy   Round ligament pain is a sharp pain or jabbing feeling often felt in the lower belly or groin area on one or both sides. It is one of the most common complaints during pregnancy and is considered a normal part of pregnancy. It is most often felt during the second trimester.   Here is what you need to know about round ligament pain, including some tips to help you feel better.   Causes of Round Ligament Pain   Several thick ligaments surround and support your womb (uterus) as it grows during pregnancy. One of them is called the round ligament.   The round ligament connects the front part of the womb to your groin, the area where your legs attach to your pelvis. The round ligament normally tightens and relaxes slowly.   As your baby and womb grow, the round ligament stretches. That makes it more likely to become strained.   Sudden movements can cause the ligament to tighten quickly, like a rubber band snapping. This causes a sudden and quick jabbing feeling.   Symptoms of Round Ligament Pain   Round ligament pain can be concerning and uncomfortable. But it is considered normal as your body changes during pregnancy.   The symptoms of round ligament pain include a sharp, sudden spasm in the belly. It usually affects the right side, but it may happen on both sides. The pain only lasts a few seconds.   Exercise may cause the pain, as will  rapid movements such as:  sneezing  coughing  laughing  rolling over in bed  standing up too quickly   Treatment of Round Ligament Pain   Here are some tips that may help reduce your discomfort:   Pain relief. Take over-the-counter acetaminophen for pain, if necessary. Ask your doctor if this is OK.   Exercise. Get plenty of exercise to keep your stomach (core) muscles strong. Doing stretching exercises or prenatal yoga can be helpful. Ask your doctor which exercises are safe for you and your baby.   A helpful exercise involves putting your hands and knees on the floor, lowering your head, and pushing your backside into the air.   Avoid sudden movements. Change positions slowly (such as standing up or sitting down) to avoid sudden movements that may cause stretching and pain.   Flex your hips. Bend and flex your hips before you cough, sneeze, or laugh to avoid pulling on the ligaments.   Apply warmth. A heating pad or warm bath may be helpful. Ask your doctor if this is OK. Extreme heat can be dangerous to the baby.   You should try to modify your daily activity level and avoid positions that may worsen the condition.   When to Call the Doctor/Midwife   Always tell your  doctor or midwife about any type of pain you have during pregnancy. Round ligament pain is quick and doesn't last long.   Call your health care provider immediately if you have:  severe pain  fever  chills  pain on urination  difficulty walking   Belly pain during pregnancy can be due to many different causes. It is important for your doctor to rule out more serious conditions, including pregnancy complications such as placenta abruption or non-pregnancy illnesses such as:  inguinal hernia  appendicitis  stomach, liver, and kidney problems  Preterm labor pains may sometimes be mistaken for round ligament pain.

## 2019-10-16 LAB — CULTURE, OB URINE: Culture: NO GROWTH

## 2019-10-16 LAB — GC/CHLAMYDIA PROBE AMP (~~LOC~~) NOT AT ARMC
Chlamydia: NEGATIVE
Comment: NEGATIVE
Comment: NORMAL
Neisseria Gonorrhea: NEGATIVE

## 2019-10-26 ENCOUNTER — Ambulatory Visit: Payer: Medicaid Other | Admitting: Psychiatry

## 2019-10-26 ENCOUNTER — Other Ambulatory Visit: Payer: Self-pay

## 2019-10-26 ENCOUNTER — Encounter: Payer: Self-pay | Admitting: *Deleted

## 2019-10-26 DIAGNOSIS — O0990 Supervision of high risk pregnancy, unspecified, unspecified trimester: Secondary | ICD-10-CM | POA: Insufficient documentation

## 2019-11-07 ENCOUNTER — Telehealth: Payer: Self-pay | Admitting: Internal Medicine

## 2019-11-07 ENCOUNTER — Ambulatory Visit (INDEPENDENT_AMBULATORY_CARE_PROVIDER_SITE_OTHER): Payer: Self-pay | Admitting: Obstetrics and Gynecology

## 2019-11-07 ENCOUNTER — Encounter: Payer: Self-pay | Admitting: Obstetrics and Gynecology

## 2019-11-07 ENCOUNTER — Other Ambulatory Visit: Payer: Self-pay

## 2019-11-07 ENCOUNTER — Other Ambulatory Visit (HOSPITAL_COMMUNITY)
Admission: RE | Admit: 2019-11-07 | Discharge: 2019-11-07 | Disposition: A | Discharge status: Home / Self Care | Payer: Medicaid Other | Source: Ambulatory Visit | Attending: Obstetrics and Gynecology | Admitting: Obstetrics and Gynecology

## 2019-11-07 VITALS — BP 123/77 | HR 108 | Wt 285.4 lb

## 2019-11-07 DIAGNOSIS — Z3A22 22 weeks gestation of pregnancy: Secondary | ICD-10-CM

## 2019-11-07 DIAGNOSIS — O99212 Obesity complicating pregnancy, second trimester: Secondary | ICD-10-CM

## 2019-11-07 DIAGNOSIS — Z6841 Body Mass Index (BMI) 40.0 and over, adult: Secondary | ICD-10-CM | POA: Insufficient documentation

## 2019-11-07 DIAGNOSIS — O0990 Supervision of high risk pregnancy, unspecified, unspecified trimester: Secondary | ICD-10-CM | POA: Insufficient documentation

## 2019-11-07 DIAGNOSIS — F419 Anxiety disorder, unspecified: Secondary | ICD-10-CM

## 2019-11-07 DIAGNOSIS — O0992 Supervision of high risk pregnancy, unspecified, second trimester: Secondary | ICD-10-CM

## 2019-11-07 DIAGNOSIS — Z62810 Personal history of physical and sexual abuse in childhood: Secondary | ICD-10-CM | POA: Insufficient documentation

## 2019-11-07 DIAGNOSIS — O99210 Obesity complicating pregnancy, unspecified trimester: Secondary | ICD-10-CM | POA: Insufficient documentation

## 2019-11-07 DIAGNOSIS — F329 Major depressive disorder, single episode, unspecified: Secondary | ICD-10-CM

## 2019-11-07 DIAGNOSIS — J45909 Unspecified asthma, uncomplicated: Secondary | ICD-10-CM

## 2019-11-07 DIAGNOSIS — K5100 Ulcerative (chronic) pancolitis without complications: Secondary | ICD-10-CM

## 2019-11-07 MED ORDER — BLOOD PRESSURE KIT DEVI: 1.0000 | Freq: Every day | 0 refills | Status: DC

## 2019-11-07 NOTE — Telephone Encounter (Signed)
Left message for pt to call back  °

## 2019-11-07 NOTE — Progress Notes (Signed)
New OB Note  11/07/2019   Clinic: Center for [facility]  Chief Complaint: NOB  Transfer of Care Patient: [facility]  History of Present Illness: Sydney Deleon is a 29 y.o. G2P0010 @ 22/5 weeks ([facility] 5/21 [tentative], based on Patient's last menstrual period was 06/01/2019.).  Preg complicated by has Anxiety and depression; Bipolar 1 disorder ([place]); Asthma; Ulcerative colitis ([place]); MDD (major depressive disorder), recurrent severe, without psychosis ([place]); MDD (major depressive disorder), recurrent, in full remission ([place]); Supervision of high risk pregnancy, antepartum; BMI 40.0-44.9, adult ([place]); and Obesity in pregnancy on their problem list.   Any events prior to today's visit: no Her periods were: qmonth, regular She was using no method when she conceived.  She has Negative signs or symptoms of nausea/vomiting of pregnancy. She has Negative signs or symptoms of miscarriage or preterm labor  ROS: A 12-point review of systems was performed and negative, except as stated in the above HPI.  OBGYN History: As per HPI. OB History  Gravida Para Term Preterm AB Living  2       1    SAB TAB Ectopic Multiple Live Births  1            # Outcome Date GA Lbr Len/2nd Weight Sex Delivery Anes PTL Lv  2 Current           1 SAB 12/18/17 [redacted]w[redacted]d          Any issues with any prior pregnancies: not applicable Prior children are healthy, doing well, and without any problems or issues: not applicable History of pap smears: Yes. Last pap smear 2015 and results were negative History of STIs: Yes   Past Medical History: Past Medical History:  Diagnosis Date  . Acid reflux   . ADHD   . Allergy   . Anxiety   . Arthritis   . Asthma   . Bipolar 1 disorder ([place]   . Bipolar disease, chronic ([place]    "since a child"    . Chronic bronchitis ([place]   . Depression   . Miscarriage within last 12 months   . PTSD (post-traumatic stress disorder)   . Ulcerative colitis ([facility] [place]      Past Surgical History: Past Surgical History:  Procedure Laterality Date  . TONSILECTOMY/ADENOIDECTOMY WITH MYRINGOTOMY      Family History:  Family History  Problem Relation Age of Onset  . Hypertension Mother   . Depression Mother   . Throat cancer Paternal Grandfather   . Colon cancer Cousin        2 cousins  . Breast cancer Cousin   . Rectal cancer Cousin   . Breast cancer Cousin   . Esophageal cancer Neg Hx   . Stomach cancer Neg Hx     Social History:  Social History   Socioeconomic History  . Marital status: Single    Spouse name: Not on file  . Number of children: Not on file  . Years of education: Not on file  . Highest education level: Not on file  Occupational History  . Not on file  Tobacco Use  . Smoking status: Never Smoker  . Smokeless tobacco: Never Used  Substance and Sexual Activity  . Alcohol use: No  . Drug use: No  . Sexual activity: Yes    Birth control/protection: None    Comment: on period now  Other Topics Concern  . Not on file  Social History Narrative  . Not on file   Social Determinants  of Health   Financial Resource Strain:   . Difficulty of Paying Living Expenses: Not on file  Food Insecurity:   . Worried About Charity fundraiser in the Last Year: Not on file  . Ran Out of Food in the Last Year: Not on file  Transportation Needs:   . Lack of Transportation (Medical): Not on file  . Lack of Transportation (Non-Medical): Not on file  Physical Activity:   . Days of Exercise per Week: Not on file  . Minutes of Exercise per Session: Not on file  Stress:   . Feeling of Stress : Not on file  Social Connections:   . Frequency of Communication with Friends and Family: Not on file  . Frequency of Social Gatherings with Friends and Family: Not on file  . Attends Religious Services: Not on file  . Active Member of Clubs or Organizations: Not on file  . Attends Archivist Meetings: Not on file  . Marital Status: Not  on file  Intimate Partner Violence:   . Fear of Current or Ex-Partner: Not on file  . Emotionally Abused: Not on file  . Physically Abused: Not on file  . Sexually Abused: Not on file    Allergy: Allergies  Allergen Reactions  . Prednisone Other (See Comments)    Suicidal thoughts, tremors, face paralysis, homicidal thoughts,caused mental health issues    Health Maintenance:  Mammogram Up to Date: not applicable  Current Outpatient Medications: PNV, folic acid, sulfasalazine, albuterol prn   Physical Exam:   BP 123/77   Pulse (!) 108   Wt 285 lb 6.4 oz (129.5 kg)   LMP 06/01/2019   BMI 47.49 kg/m  Body mass index is 47.49 kg/m. Contractions: Not present Vag. Bleeding: None. Fundal height: 22 FHTs: 150s  General appearance: Well nourished, well developed female in no acute distress.  Neck:  Supple, normal appearance, and no thyromegaly  Cardiovascular: S1, S2 normal, no murmur, rub or gallop, regular rate and rhythm Respiratory:  Clear to auscultation bilateral. Normal respiratory effort Abdomen: positive bowel sounds and no masses, hernias; diffusely non tender to palpation, non distended Breasts: pt denies any breast s/s Neuro/Psych:  Normal mood and affect.  Skin:  Warm and dry.  Lymphatic:  No inguinal lymphadenopathy.   Pelvic exam: is not limited by body habitus EGBUS: within normal limits, Vagina: within normal limits and with no blood in the vault, Cervix: normal appearing cervix without discharge or lesions, closed/long/high, Uterus:  enlarged, c/w 20-22 week size, and Adnexa:  normal adnexa and no mass, fullness, tenderness  Laboratory: none  Imaging:  none  Assessment: pt doing well  Plan: 1. Supervision of high risk pregnancy, antepartum Routine care. Offer genetics once dates are confirmed. Pt states she's never had an u/s before - Culture, OB Urine - CHL AMB BABYSCRIPTS SCHEDULE OPTIMIZATION - Obstetric Panel, Including HIV - [place] MFM OB  DETAIL +14 WK; Future - Cytology - PAP - Comprehensive metabolic panel - Protein / creatinine ratio, urine - Hemoglobin A1c - TSH - VITAMIN D 25 Hydroxy (Vit-D Deficiency, Fractures)  2. Obesity in pregnancy  3. Anxiety and depression Continue celexa Seen by [facility] in November  4. BMI 40.0-44.9, adult ([place])  5. Ulcerative pancolitis without complication ([place]) Okay to continue sulfasalazine and extra folic acid. Pt states she thinks she's missed a GI visit since +UPT. Pt to contact GI for routine follow up  6. Uncomplicated asthma, unspecified asthma severity, unspecified whether persistent Not even weekly  albuterol use and only needed at night.   Problem list reviewed and updated.  Follow up in 3 weeks.  The nature of [place] with multiple MDs and other Advanced Practice Providers was explained to patient; also emphasized that residents, students are part of our team.  >50% of 25 min visit spent on counseling and coordination of care.     Durene Romans MD Attending Center for [facility])

## 2019-11-07 NOTE — Progress Notes (Deleted)
08/07/19 flu

## 2019-11-07 NOTE — Patient Instructions (Signed)
Continue folic acid supplementation  Your UC medication is safe to take in pregnancy. Talk to your GI doctor for a follow up appointment and to see if they want to keep this medication going.

## 2019-11-08 ENCOUNTER — Other Ambulatory Visit: Payer: Self-pay | Admitting: Obstetrics and Gynecology

## 2019-11-08 ENCOUNTER — Encounter: Payer: Self-pay | Admitting: Obstetrics and Gynecology

## 2019-11-08 ENCOUNTER — Telehealth: Payer: Self-pay | Admitting: General Practice

## 2019-11-08 DIAGNOSIS — R7989 Other specified abnormal findings of blood chemistry: Secondary | ICD-10-CM | POA: Insufficient documentation

## 2019-11-08 DIAGNOSIS — E559 Vitamin D deficiency, unspecified: Secondary | ICD-10-CM | POA: Insufficient documentation

## 2019-11-08 LAB — OBSTETRIC PANEL, INCLUDING HIV
Antibody Screen: NEGATIVE
Basophils Absolute: 0 10*3/uL (ref 0.0–0.2)
Basos: 0 %
EOS (ABSOLUTE): 0.1 10*3/uL (ref 0.0–0.4)
Eos: 1 %
HIV Screen 4th Generation wRfx: NONREACTIVE
Hematocrit: 34.7 % (ref 34.0–46.6)
Hemoglobin: 11.9 g/dL (ref 11.1–15.9)
Hepatitis B Surface Ag: NEGATIVE
Immature Grans (Abs): 0.1 10*3/uL (ref 0.0–0.1)
Immature Granulocytes: 1 %
Lymphocytes Absolute: 1.7 10*3/uL (ref 0.7–3.1)
Lymphs: 18 %
MCH: 31.5 pg (ref 26.6–33.0)
MCHC: 34.3 g/dL (ref 31.5–35.7)
MCV: 92 fL (ref 79–97)
Monocytes Absolute: 0.6 10*3/uL (ref 0.1–0.9)
Monocytes: 7 %
Neutrophils Absolute: 6.9 10*3/uL (ref 1.4–7.0)
Neutrophils: 73 %
Platelets: 268 10*3/uL (ref 150–450)
RBC: 3.78 x10E6/uL (ref 3.77–5.28)
RDW: 13.5 % (ref 11.7–15.4)
RPR Ser Ql: NONREACTIVE
Rh Factor: POSITIVE
Rubella Antibodies, IGG: 1.9 index (ref >0.99)
WBC: 9.4 10*3/uL (ref 3.4–10.8)

## 2019-11-08 LAB — COMPREHENSIVE METABOLIC PANEL
ALT: 11 IU/L (ref 0–32)
AST: 18 IU/L (ref 0–40)
Albumin/Globulin Ratio: 1.1 — ABNORMAL LOW (ref 1.2–2.2)
Albumin: 3.6 g/dL — ABNORMAL LOW (ref 3.9–5.0)
Alkaline Phosphatase: 44 IU/L (ref 39–117)
BUN/Creatinine Ratio: 8 — ABNORMAL LOW (ref 9–23)
BUN: 5 mg/dL — ABNORMAL LOW (ref 6–20)
Bilirubin Total: <0.2 mg/dL (ref 0.0–1.2)
CO2: 18 mmol/L — ABNORMAL LOW (ref 20–29)
Calcium: 9.3 mg/dL (ref 8.7–10.2)
Chloride: 105 mmol/L (ref 96–106)
Creatinine, Ser: 0.62 mg/dL (ref 0.57–1.00)
GFR calc Af Amer: 140 mL/min/{1.73_m2} (ref >59)
GFR calc non Af Amer: 121 mL/min/{1.73_m2} (ref >59)
Globulin, Total: 3.3 g/dL (ref 1.5–4.5)
Glucose: 106 mg/dL — ABNORMAL HIGH (ref 65–99)
Potassium: 4.1 mmol/L (ref 3.5–5.2)
Sodium: 138 mmol/L (ref 134–144)
Total Protein: 6.9 g/dL (ref 6.0–8.5)

## 2019-11-08 LAB — HEMOGLOBIN A1C
Est. average glucose Bld gHb Est-mCnc: 100 mg/dL
Hgb A1c MFr Bld: 5.1 % (ref 4.8–5.6)

## 2019-11-08 LAB — PROTEIN / CREATININE RATIO, URINE
Creatinine, Urine: 80.9 mg/dL
Protein, Ur: 14.5 mg/dL
Protein/Creat Ratio: 179 mg/g creat (ref 0–200)

## 2019-11-08 LAB — VITAMIN D 25 HYDROXY (VIT D DEFICIENCY, FRACTURES): Vit D, 25-Hydroxy: 13.4 ng/mL — ABNORMAL LOW (ref 30.0–100.0)

## 2019-11-08 LAB — TSH: TSH: 1.46 u[IU]/mL (ref 0.450–4.500)

## 2019-11-08 MED ORDER — VITAMIN D (ERGOCALCIFEROL) 1.25 MG (50000 UNIT) PO CAPS: 50000.0000 [IU] | ORAL_CAPSULE | ORAL | 0 refills | Status: AC

## 2019-11-08 NOTE — Telephone Encounter (Signed)
Patient called and left message on nurse voicemail line stating she just saw Dr Ilda Basset yesterday and has questions about her results. Patient states Dr Ilda Basset asked her to send a picture of her medication through [facility] but she cannot figure out how. Patient is requesting a call back.   Called patient and reviewed results with her. Discussed any labs that are abnormal Dr Ilda Basset will reach out to her. Patient verbalized understanding. She states she cannot figure out how to send picture in [place]. Told patient that is okay and asked for name of medication. Patient states she takes Sulfasalazine 551m BID and sees Dr PBlanch MediaPA JJanett Billow Her next appt with GI is 2/2. Told patient I would forward that information to Dr PIlda Basset Patient verbalized understanding.

## 2019-11-12 LAB — CYTOLOGY - PAP
Chlamydia: NEGATIVE
Comment: NEGATIVE
Comment: NEGATIVE
Comment: NEGATIVE
Comment: NORMAL
Diagnosis: NEGATIVE
High risk HPV: NEGATIVE
Neisseria Gonorrhea: NEGATIVE
Trichomonas: NEGATIVE

## 2019-11-12 LAB — CULTURE, OB URINE

## 2019-11-12 LAB — URINE CULTURE, OB REFLEX

## 2019-11-19 IMAGING — CT CT RENAL STONE PROTOCOL
2 of 4 series · 16 of 46 positions shown, 18 images · non-contrast
Comparison: None.

CLINICAL DATA: 18-year-old female with hematuria of unknown cause.

EXAM:
CT ABDOMEN AND PELVIS WITHOUT CONTRAST
TECHNIQUE: Multidetector CT imaging of the abdomen and pelvis was performed
following the standard protocol without IV contrast.

[Series 3: stone study 5.0 i30f 2 · axial · 0.66mm/px · z∈[+876,+1301]mm · 13 of 93 slices shown, 15 images]
[im 4/93  soft-tissue]
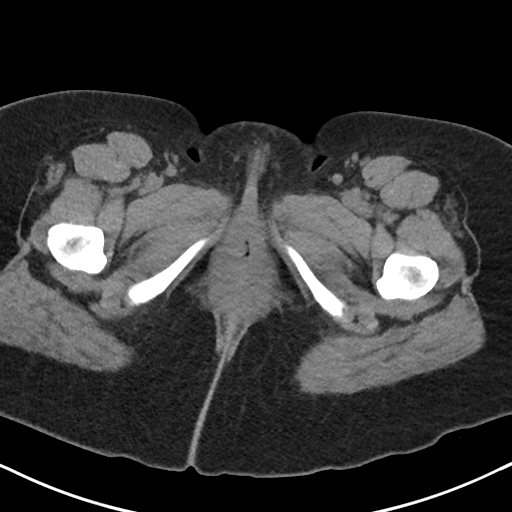
[im 4/93  bone]
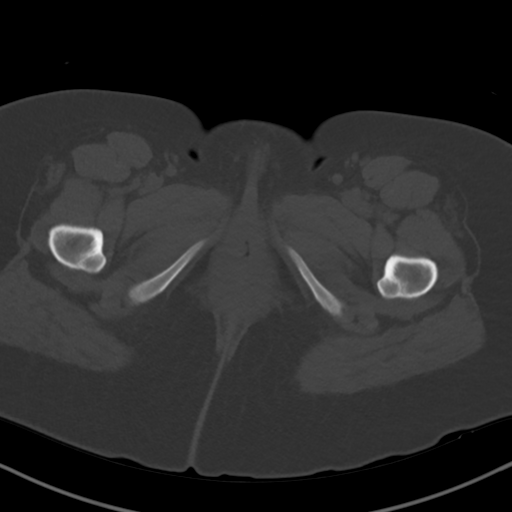
[im 11/93  soft-tissue]
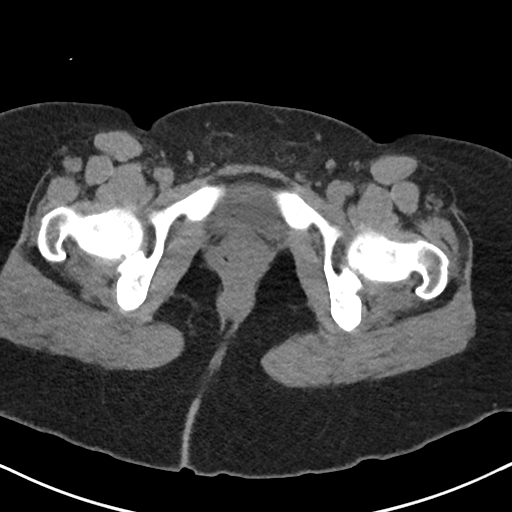
[im 18/93  soft-tissue]
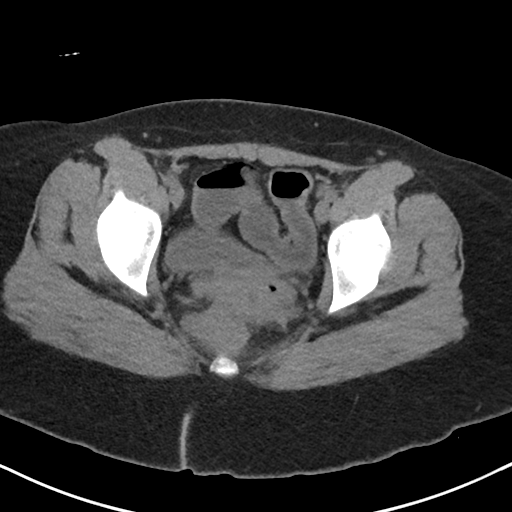
[im 25/93  soft-tissue]
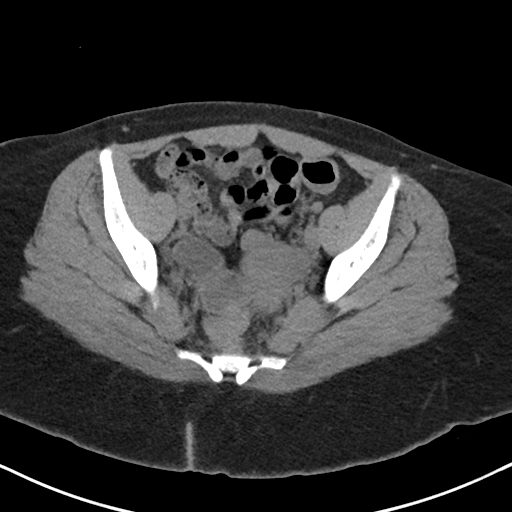
[im 32/93  soft-tissue]
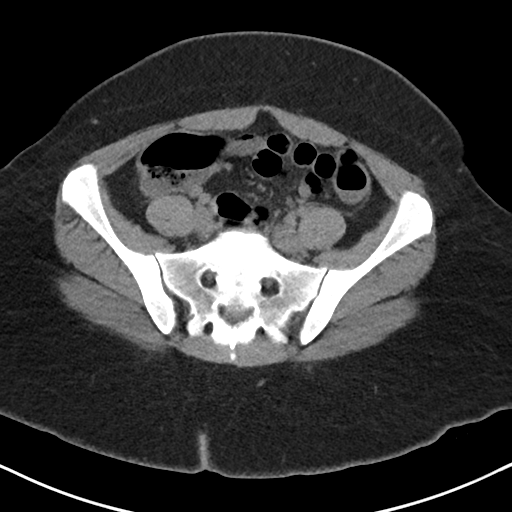
[im 39/93  soft-tissue]
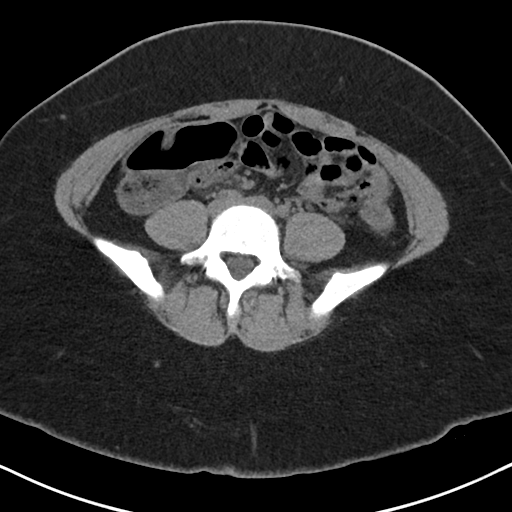
[im 47/93  soft-tissue]
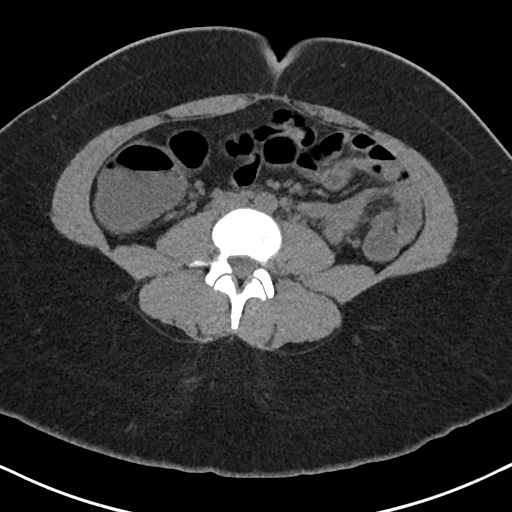
[im 54/93  soft-tissue]
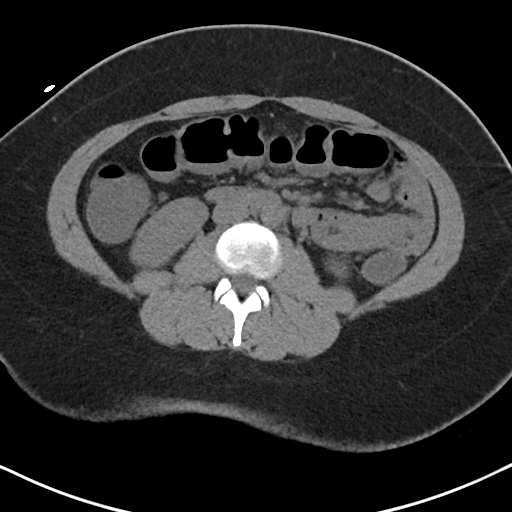
[im 61/93  soft-tissue]
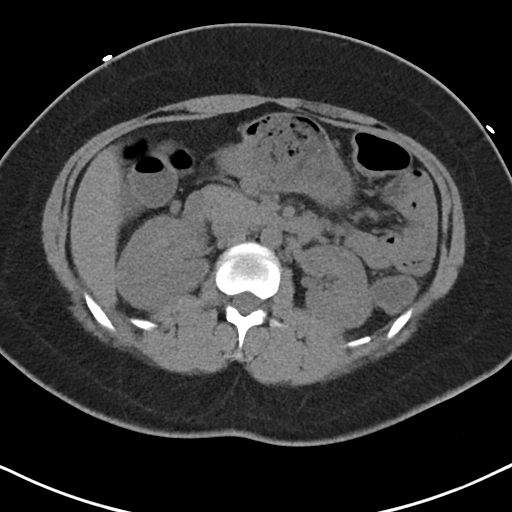
[im 61/93  bone]
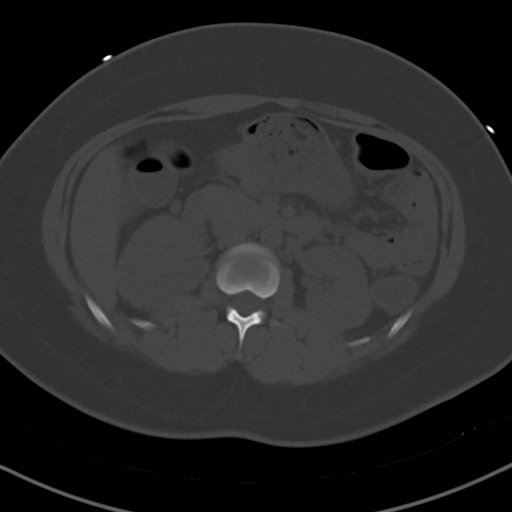
[im 68/93  soft-tissue]
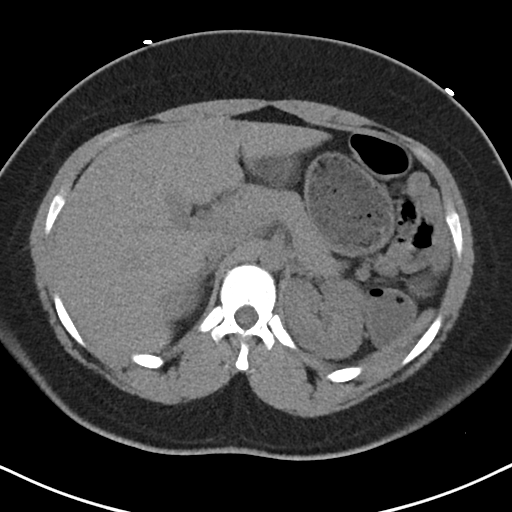
[im 75/93  soft-tissue]
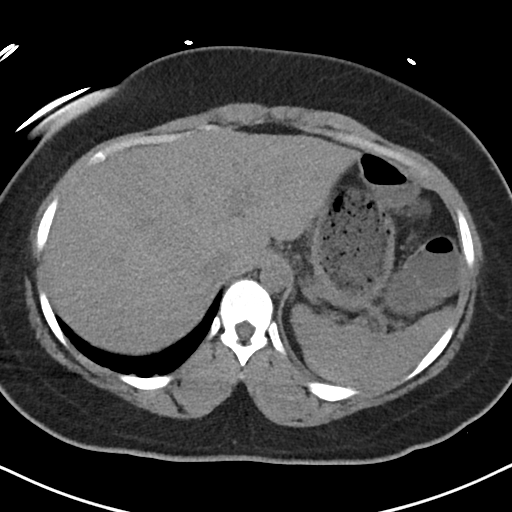
[im 82/93  soft-tissue]
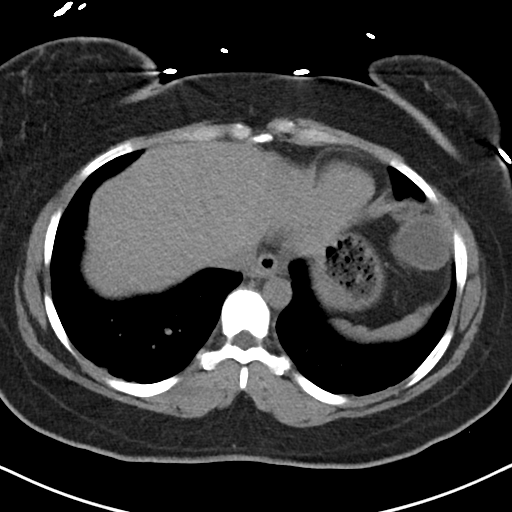
[im 89/93  soft-tissue]
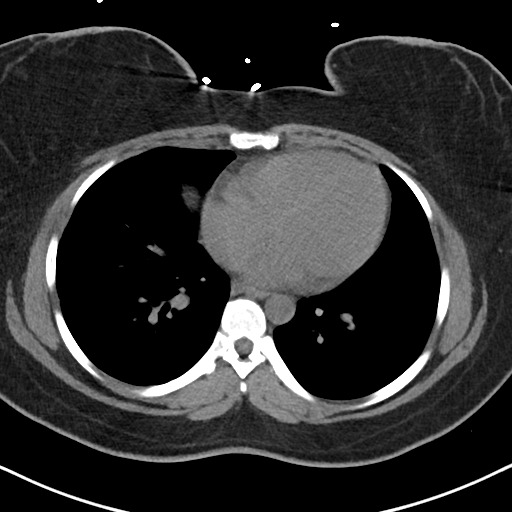

[Series 6: coronal soft tissue · coronal · 0.70mm/px · 3 of 83 slices shown]
[im 28/83  soft-tissue]
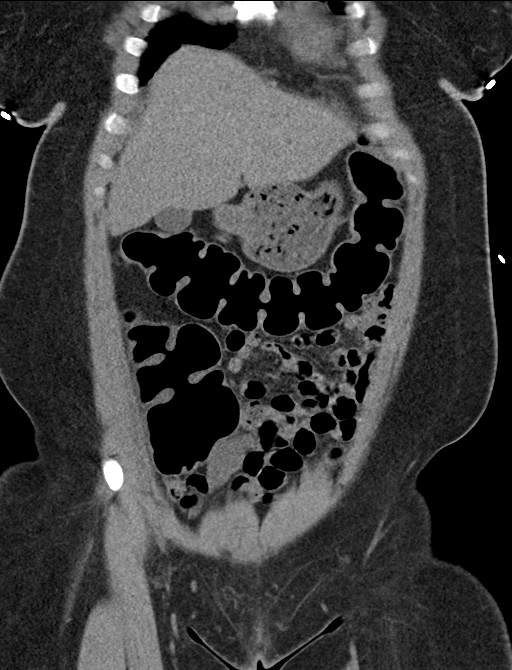
[im 37/83  soft-tissue]
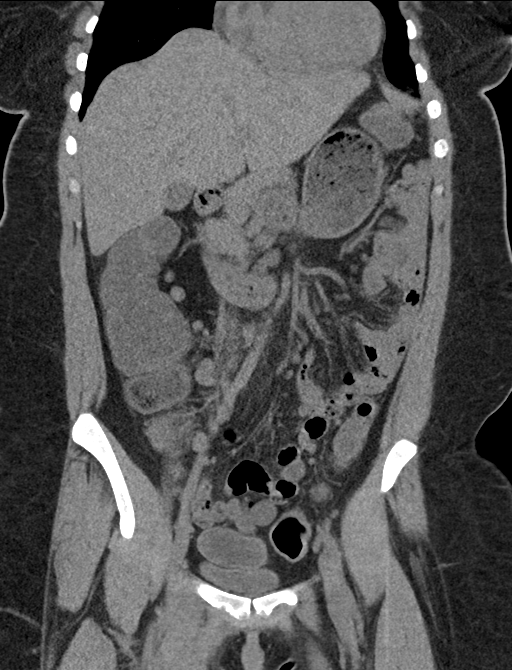
[im 46/83  soft-tissue]
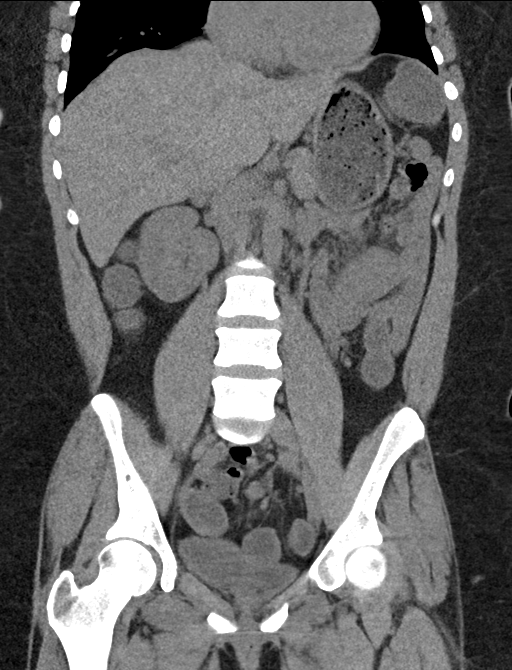

[16 of 46 positions shown; findings below may reference images not displayed]

FINDINGS: Evaluation of this exam is limited in the absence of intravenous
contrast.

Lower chest: Minimal bibasilar dependent atelectatic changes.

No intra-abdominal free air or free fluid.

Hepatobiliary: No focal liver abnormality is seen. No gallstones,
gallbladder wall thickening, or biliary dilatation.

Pancreas: Unremarkable. No pancreatic ductal dilatation or
surrounding inflammatory changes.

Spleen: Normal in size without focal abnormality.

Adrenals/Urinary Tract: Adrenal glands are unremarkable. Kidneys are
normal, without renal calculi, focal lesion, or hydronephrosis.
Bladder is unremarkable.

Stomach/Bowel: There is no bowel obstruction. There is liquid stool
throughout the colon compatible with diarrheal state. Correlation
with clinical exam and stool cultures recommended. Minimal apparent
thickening of the colonic wall, likely reactive. The appendix is
normal.

Vascular/Lymphatic: The abdominal aorta and IVC are unremarkable on
this noncontrast CT. No portal venous gas. Mildly enlarged
peripancreatic and mesenteric lymph nodes, likely reactive.

Reproductive: The uterus and ovaries are grossly unremarkable.

Other: None

Musculoskeletal: No acute or significant osseous findings.
IMPRESSION: 1. No hydronephrosis or nephrolithiasis.
2. Diarrheal state. Correlation with clinical exam and stool
cultures recommended. No bowel obstruction. Normal appendix.
3. Mildly enlarged peripancreatic and mesenteric lymph nodes, likely
reactive. Clinical correlation is recommended.

## 2019-11-20 ENCOUNTER — Ambulatory Visit: Payer: Medicaid Other | Admitting: Gastroenterology

## 2019-11-22 ENCOUNTER — Other Ambulatory Visit: Payer: Self-pay

## 2019-11-22 ENCOUNTER — Ambulatory Visit (HOSPITAL_COMMUNITY)
Admission: RE | Admit: 2019-11-22 | Discharge: 2019-11-22 | Disposition: A | Discharge status: Home / Self Care | Payer: Self-pay | Source: Ambulatory Visit | Attending: Obstetrics and Gynecology | Admitting: Obstetrics and Gynecology

## 2019-11-22 ENCOUNTER — Ambulatory Visit (HOSPITAL_COMMUNITY): Payer: Self-pay | Admitting: *Deleted

## 2019-11-22 ENCOUNTER — Ambulatory Visit (HOSPITAL_COMMUNITY): Payer: Medicaid Other | Attending: Obstetrics and Gynecology | Admitting: Genetic Counselor

## 2019-11-22 DIAGNOSIS — O358XX0 Maternal care for other (suspected) fetal abnormality and damage, not applicable or unspecified: Secondary | ICD-10-CM

## 2019-11-22 DIAGNOSIS — O0990 Supervision of high risk pregnancy, unspecified, unspecified trimester: Secondary | ICD-10-CM

## 2019-11-22 DIAGNOSIS — O0992 Supervision of high risk pregnancy, unspecified, second trimester: Secondary | ICD-10-CM

## 2019-11-22 DIAGNOSIS — O352XX0 Maternal care for (suspected) hereditary disease in fetus, not applicable or unspecified: Secondary | ICD-10-CM

## 2019-11-22 DIAGNOSIS — Z3A25 25 weeks gestation of pregnancy: Secondary | ICD-10-CM

## 2019-11-22 DIAGNOSIS — Z315 Encounter for genetic counseling: Secondary | ICD-10-CM

## 2019-11-22 DIAGNOSIS — O359XX0 Maternal care for (suspected) fetal abnormality and damage, unspecified, not applicable or unspecified: Secondary | ICD-10-CM

## 2019-11-22 DIAGNOSIS — Z3A24 24 weeks gestation of pregnancy: Secondary | ICD-10-CM

## 2019-11-22 DIAGNOSIS — O35HXX0 Maternal care for other (suspected) fetal abnormality and damage, fetal lower extremities anomalies, not applicable or unspecified: Secondary | ICD-10-CM

## 2019-11-22 DIAGNOSIS — O283 Abnormal ultrasonic finding on antenatal screening of mother: Secondary | ICD-10-CM

## 2019-11-22 DIAGNOSIS — O99212 Obesity complicating pregnancy, second trimester: Secondary | ICD-10-CM

## 2019-11-22 NOTE — Consult Note (Signed)
MFM Note  This patient was seen for a detailed fetal anatomy scan due to maternal obesity.  The patient reports a history of ulcerative colitis that is currently treated with sulfasalazine.  She also has a significant history of mental health issues.  She reports a history of bipolar disorder, attention deficit hyperactivity disorder, and posttraumatic stress disorder.  She is currently treated with citalopram.  The patient reports that her current due date of Mar 07, 2020 is based on an accurate last menstrual period.  She has not had any ultrasounds performed in her current pregnancy.  She has not had any screening tests for fetal aneuploidy performed in her current pregnancy.  On today's exam, shortened long bones, bowed legs, and possible clubfeet were noted.  The femur and humerus lengths measured about 6 weeks behind her dates.  The femur also appeared to be curved.  The increased risk of skeletal dysplasia and dwarfism due to today's ultrasound findings were discussed with the patient.  The patient was advised that I highly recommend that she undergo an amniocentesis for definitive diagnosis of fetal aneuploidy and skeletal dysplasia/dwarfism.  The patient initially stated that she wanted the procedure to be performed today.  I had the patient meet with our genetic counselor after the ultrasound appointment so that she can be better informed regarding today's ultrasound findings.  However, during her genetic counseling appointment, the patient stated that she had to leave and walked out of the  office.  The patient inquired from the genetic counselor if it is too late to terminate her pregnancy.  She was advised that it is probably too late for her to terminate in [place].  However, we could refer her to another state where she may still legally terminate her pregnancy.  She was advised to make up her mind as soon as possible.  We will contact the patient via telephone tomorrow to  determine if she would like to return for an amniocentesis.  We will also schedule a follow-up appointment for her via telephone.  We will also refer her for a fetal echocardiogram with pediatric cardiology.  A total of 30 minutes was spent counseling and coordinating the care for this patient.  Greater than 50% of the time was spent in direct face-to-face contact.

## 2019-11-23 ENCOUNTER — Telehealth (HOSPITAL_COMMUNITY): Payer: Self-pay | Admitting: Genetic Counselor

## 2019-11-23 NOTE — Telephone Encounter (Signed)
I called Ms. Flinchum to check in on how she was doing after yesterday's ultrasound. Ms. Grandville Silos apologized for having to leave yesterday, explaining that she was feeling overwhelmed and wanted to go home and process the information with her mother and the father of the pregnancy. I validated that she had been presented with a lot of unexpected, heavy information and many people would feel the same way. She indicated that she has come to terms with the possibility of the fetus having a skeletal dysplasia and "will love and accept her baby girl no matter what." She was comforted by the fact that her baby's heart and chest appeared normal based on what could be seen on yesterday's ultrasound.  Ms. Portee considered the possibility of amniocentesis following her discussion with Dr. Annamaria Boots yesterday. After careful consideration, Ms. Beckstrom declined amniocentesis. She understands that our understanding of prognosis and predictions for postnatal medical/developmental complications will be limited by what can be seen on ultrasound, and that ultrasound cannot rule out all birth defects. She also understands that recurrence risks cannot be accurately predicted until a precise diagnosis is made.  We discussed that postnatal testing is possible to attempt to elucidate a diagnosis for her daughter. [facility] is a laboratory that offers a no-cost sponsored skeletal dysplasia genetic testing panel containing 150 genes, which Ms. Edrington expressed interest in. We also discussed a single gene noninvasive prenatal screening (NIPS) option called Vistara. Vistarascreens a pregnancy for 25 autosomal or X-linked dominant single gene disorders, including a few of the most common skeletal dysplasias. Vistara requires a paternal sample in order for analysis to be run. We discussed that it may be possible for the father of the pregnancy to come for the end of Ms. Candler's next ultrasound when the doctor is presenting the findings.  We could then draw a maternal and paternal sample at that time. Ms. Paolillo expressed interest in this plan and Vistara.  We discussed that the pregnancy will continue to be followed closely with ultrasounds in Maternal Fetal Medicine. The fetus will be monitored for additional features that may be associated with specific skeletal dysplasias as well as fetal growth restriction. It is also recommended that Ms. Steffek have a fetal echocardiogram to rule out congenital heart defects. We will schedule Ms. Gloeckner's follow-up ultrasounds and place a referral for the fetal echocardiogram.  I will call Ms. Giovannetti early next week with the date of her next ultrasound and confirmation about whether the father of the pregnancy is permitted to come at the end. All of her questions were answered to her satisfaction and she confirmed that she had no further questions at this time.  Buelah Manis, MS Genetic Counselor

## 2019-11-23 NOTE — Progress Notes (Signed)
ADDENDUM 2/8: See phone notes from 11/22/19 and 11/26/19 for further discussions about pregnancy management and testing options. ------------------------------------------------------------------------------------------------------------------------------------- 11/23/2019  Sydney Deleon [birth year] MRN: [id number] DOV: 11/22/2019  Sydney Deleon presented to the [facility] for a genetics consultation regarding suspected fetal skeletal dysplasia identified via ultrasound. Sydney Deleon came to her appointment alone due to COVID-19 visitor restrictions.   Indication for genetic counseling - Suspected fetal skeletal dysplasia  Prenatal history  Sydney Deleon is a G20P0010, 29 y.o. female. Her current pregnancy has completed [redacted]w[redacted]d(Estimated Date of Delivery: 03/07/20). Comprehensive prenatal history was not obtained at this visit.  Family History  A three generation pedigree was not drafted at this visit.  Discussion  Sydney. TCarranzapresented to genetic counseling due to ultrasound anomalies concerning for a possible fetal skeletal dysplasia identified on ultrasound today. The ultrasound report will be sent under separate cover. Shortened long bones, bowed legs, and possible clubfeet were noted. The fetuses's femur and humerus lengths measured 6 weeks behind dates. Based on these findings, there is concern for a fetal skeletal dysplasia.    Skeletal dysplasias are a group of heritable disorders characterized by abnormalities of the bone and cartilage. There are 436 disorders that are classified as skeletal dysplasias, with 364 causative genes identified. Several patterns of inheritance are associated with skeletal dysplasias, including autosomal recessive, autosomal dominant, and X-linked inheritance. Additionally, some skeletal dysplasias are associated with imprinting disorders and teratogenic exposures. Recurrence risks are highly dependent on the underlying cause of the  skeletal dysplasia.   Skeletal dysplasias account for 5% of all genetic disorders seen in the neonatal period. Every 1 in 5000 livebirths and 10 in 5000 stillbirths have a skeletal dysplasia. Achondroplasia is the most common skeletal dysplasia; however, it is often not diagnosed prenatally. Approximately 66% of prenatally-diagnosed skeletal dysplasias are attributed to campomelic dysplasia, thanatophoric dysplasia, or osteogenesis imperfecta (OI). Prognosis depends on the specific type of skeletal dysplasia that is identified. Regardless of diagnosis, approximately 50% of infants with skeletal dysplasia are stillborn or die within the first 6 weeks of life.  We discussed that our understanding of expected prognosis for the current fetus is limited to what could be identified on ultrasound today. There are several additional screening/testing options available to get more information about a possible diagnosis for the fetus, which may help to better inform expected prognosis. Firstly, a fetal echocardiogram is recommended for a deeper assessment of fetal cardiac anomalies. Follow-up ultrasounds in Maternal Fetal Medicine are also recommended. Secondly, prenatal screening for chromosomal aneuploidies is possible. Sydney. TLuckeyreported that she had previously had aneuploidy screening performed and that it was reportedly normal. However, I cannot find records to verify this. Sydney. TCranmerwas also informed that screening for chromosomal aneuploidies does not assess for skeletal dysplasias. Thirdly, diagnostic testing via amniocentesis is an option. We discussed the technical aspects of the procedure and quoted up to a 1 in 500 (0.2%) risk for spontaneous pregnancy loss or other adverse pregnancy outcomes as a result of amniocentesis. Cultured cells from an amniocentesis sample allow for the visualization of a fetal karyotype, which can detect >99% of chromosomal aberrations. Chromosomal microarray can also be  performed to identify smaller deletions or duplications of fetal chromosomal material. Amniocentesis could also be performed to assess whether the baby is affected by a skeletal dysplasia. [facility] offers a prenatal panel containing 48 genes associated with various skeletal dysplasias. Finally, Sydney Deleon opt to pursue postnatal evaluation for skeletal dysplasias.  A postnatal evaluation by medical genetics could help to inform suspected differentials, and postnatal testing options for skeletal dysplasias are more comprehensive than those available prenatally. If prenatal diagnostic testing is not desired, obstetric and neonatal management for the pregnancy will continue to be informed by the features identified on ultrasound.    We discussed the possible benefits and limitations associated with diagnostic testing via amniocentesis. Firstly, diagnostic testing could be performed for a variety of genetic conditions that could be associated with the abnormalities identified on ultrasound. We discussed thatsome individuals may choose to end a pregnancy if they learned of a genetic condition in the fetus. Secondly, for individuals who would not alter their pregnancy management regardless of testing outcomes, a prenatal diagnosiscould allowfordelivery planning, prenatal consults with specialists that would be involved intheinfant's care, earlier [employer] resources, and time to plan and prepare emotionally, physically,and financially. Additionally, diagnostic testing could help to inform prognosis, guide neonatal management, and determine recurrence risks for future pregnancies. Limitations associated with amniocentesis include the possibility of receiving a negative result, as prenatal testing cannot identify all possible genetic conditions. Thus, a negative result would not negate the findings on ultrasound.   Sydney Deleon requested more information about termination of pregnancy. She was informed  that it is only an option to end a pregnancy until20-22 weeks' gestationin the state of [place]. However, termination is still an option in [place] until 25 weeks 6 days for some cases and beyond this timeframe in other states throughout the [place]. Results from skeletal dysplasia genetic testing on an amniocentesis sample could take 3-4 weeks to be returned. Some individuals prefer to wait for the results from diagnostic testing before making a decision regarding continuation of a pregnancy; others may consider termination of pregnancy based on identified ultrasound anomalies alone. I informed Sydney. Schexnider that I could contact the [facility] in [place] if she was interested in pursuing termination of pregnancy. However, given that she is almost [redacted] weeks pregnant, I could not  guarantee that an appointment would be available prior to the 25 week 6 day limit. If Sydney. Rader is considering termination but would prefer to have results from diagnostic testing back before undergoing a procedure, we could explore the possibility of termination in another state. If Sydney. Ramberg opts to continue the pregnancy, we will continue to discuss available management options.   Sydney. Bentsen was understandably overwhelmed and emotional about the information presented during her visit today. Sydney. Nissley requested that she go home to process the information presented today before making any definitive decisions, so our session was unfortunately cut short. We made a plan for me to check in with her via phone tomorrow after she has had some time to consider her options. Sydney. Swing was agreeable to this plan.  I counseled Sydney. Halk regarding the above risks and available options. The approximate face-to-face time with the genetic counselor was 20 minutes.  In summary:  Discussed ultrasound findings suspicious for fetal skeletal dysplasia  Shortened long bones, bowed legs, and possible clubfeet  noted  Offered additional testing and screening  Recommend fetal echocardiogram. We will place referral  Offered amniocentesis  Could not decide about further testing at this time. I will follow up with her tomorrow   Buelah Manis, [place]

## 2019-11-24 ENCOUNTER — Encounter: Payer: Self-pay | Admitting: Obstetrics and Gynecology

## 2019-11-24 DIAGNOSIS — O365990 Maternal care for other known or suspected poor fetal growth, unspecified trimester, not applicable or unspecified: Secondary | ICD-10-CM | POA: Insufficient documentation

## 2019-11-24 DIAGNOSIS — O283 Abnormal ultrasonic finding on antenatal screening of mother: Secondary | ICD-10-CM | POA: Insufficient documentation

## 2019-11-26 ENCOUNTER — Telehealth (HOSPITAL_COMMUNITY): Payer: Self-pay | Admitting: Genetic Counselor

## 2019-11-26 NOTE — Telephone Encounter (Signed)
Called Sydney Deleon to let her know that she has been scheduled for a follow-up appointment on 3/1 at 2:15 pm. She will be seen by Dr. Donalee Citrin that day. We discussed that the father of the pregnancy would be permitted to come at the end of the ultrasound when the doctor is presenting and they both could have their samples drawn for [facility]ara at that time; however, Sydney Deleon informed me that it would just be her attending the appointment on 3/1. We discussed that samples for Vistara could always be drawn at an ultrasound date in the future. A referral for a fetal echocardiogram has also been placed, so Sydney Deleon will be hearing directly from the pediatric cardiologist in regards to her fetal echocardiogram appointment. Sydney Deleon confirmed she had no further questions at this time.  Buelah Manis, MS Genetic Counselor

## 2019-11-27 ENCOUNTER — Other Ambulatory Visit (HOSPITAL_COMMUNITY): Payer: Self-pay | Admitting: *Deleted

## 2019-11-27 DIAGNOSIS — O359XX0 Maternal care for (suspected) fetal abnormality and damage, unspecified, not applicable or unspecified: Secondary | ICD-10-CM

## 2019-11-28 ENCOUNTER — Telehealth: Payer: Medicaid Other | Admitting: Family Medicine

## 2019-11-28 ENCOUNTER — Other Ambulatory Visit: Payer: Self-pay

## 2019-11-28 DIAGNOSIS — Z5329 Procedure and treatment not carried out because of patient's decision for other reasons: Secondary | ICD-10-CM

## 2019-11-28 DIAGNOSIS — Z91199 Patient's noncompliance with other medical treatment and regimen due to unspecified reason: Secondary | ICD-10-CM

## 2019-11-28 NOTE — Progress Notes (Signed)
425pm- Called patient for OB visit, no answer- left message to call us back as soon as possible or get into mychart account to begin video visit.

## 2019-11-28 NOTE — Progress Notes (Signed)
Called pt at 1636. VM left stating I am calling to check pt in for virtual appt. Explained since this is our second attempt to contact pt, she will need to reschedule with our office. Call back number given.   Apolonio Schneiders RN 11/28/19

## 2019-11-28 NOTE — Progress Notes (Signed)
  Patient did not keep appointment today. She will be called to reschedule.

## 2019-11-30 ENCOUNTER — Encounter (HOSPITAL_COMMUNITY): Payer: Self-pay | Admitting: Obstetrics and Gynecology

## 2019-12-03 ENCOUNTER — Encounter: Payer: Self-pay | Admitting: Family Medicine

## 2019-12-03 ENCOUNTER — Encounter: Payer: Medicaid Other | Admitting: Family Medicine

## 2019-12-13 ENCOUNTER — Encounter: Payer: Medicaid Other | Admitting: Family Medicine

## 2019-12-17 ENCOUNTER — Ambulatory Visit (HOSPITAL_COMMUNITY): Payer: Medicaid Other

## 2019-12-19 ENCOUNTER — Encounter (HOSPITAL_COMMUNITY): Payer: Self-pay

## 2019-12-19 ENCOUNTER — Ambulatory Visit (HOSPITAL_COMMUNITY): Payer: Medicaid Other | Attending: Obstetrics and Gynecology

## 2019-12-19 ENCOUNTER — Ambulatory Visit (HOSPITAL_COMMUNITY): Payer: Medicaid Other

## 2019-12-27 ENCOUNTER — Encounter: Payer: Self-pay | Admitting: Obstetrics and Gynecology

## 2019-12-27 ENCOUNTER — Ambulatory Visit (INDEPENDENT_AMBULATORY_CARE_PROVIDER_SITE_OTHER): Payer: Self-pay | Admitting: Obstetrics and Gynecology

## 2019-12-27 ENCOUNTER — Other Ambulatory Visit: Payer: Self-pay

## 2019-12-27 VITALS — BP 128/80 | HR 108 | Wt 284.5 lb

## 2019-12-27 DIAGNOSIS — F329 Major depressive disorder, single episode, unspecified: Secondary | ICD-10-CM

## 2019-12-27 DIAGNOSIS — O2243 Hemorrhoids in pregnancy, third trimester: Secondary | ICD-10-CM

## 2019-12-27 DIAGNOSIS — F319 Bipolar disorder, unspecified: Secondary | ICD-10-CM

## 2019-12-27 DIAGNOSIS — O283 Abnormal ultrasonic finding on antenatal screening of mother: Secondary | ICD-10-CM

## 2019-12-27 DIAGNOSIS — O99210 Obesity complicating pregnancy, unspecified trimester: Secondary | ICD-10-CM

## 2019-12-27 DIAGNOSIS — Z3A29 29 weeks gestation of pregnancy: Secondary | ICD-10-CM

## 2019-12-27 DIAGNOSIS — J45909 Unspecified asthma, uncomplicated: Secondary | ICD-10-CM

## 2019-12-27 DIAGNOSIS — Z23 Encounter for immunization: Secondary | ICD-10-CM

## 2019-12-27 DIAGNOSIS — F419 Anxiety disorder, unspecified: Secondary | ICD-10-CM

## 2019-12-27 DIAGNOSIS — Z6841 Body Mass Index (BMI) 40.0 and over, adult: Secondary | ICD-10-CM

## 2019-12-27 DIAGNOSIS — O365930 Maternal care for other known or suspected poor fetal growth, third trimester, not applicable or unspecified: Secondary | ICD-10-CM

## 2019-12-27 DIAGNOSIS — K5100 Ulcerative (chronic) pancolitis without complications: Secondary | ICD-10-CM

## 2019-12-27 DIAGNOSIS — O0990 Supervision of high risk pregnancy, unspecified, unspecified trimester: Secondary | ICD-10-CM
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
## 2019-12-28 ENCOUNTER — Encounter (HOSPITAL_COMMUNITY): Payer: Self-pay | Admitting: Pediatric Cardiology

## 2019-12-28 LAB — HIV ANTIBODY (ROUTINE TESTING W REFLEX): HIV Screen 4th Generation wRfx: NONREACTIVE

## 2019-12-28 LAB — CBC
Hematocrit: 31.5 % — ABNORMAL LOW (ref 34.0–46.6)
Hemoglobin: 11.1 g/dL (ref 11.1–15.9)
MCH: 31.1 pg (ref 26.6–33.0)
MCHC: 35.2 g/dL (ref 31.5–35.7)
MCV: 88 fL (ref 79–97)
Platelets: 261 10*3/uL (ref 150–450)
RBC: 3.57 x10E6/uL — ABNORMAL LOW (ref 3.77–5.28)
RDW: 13 % (ref 11.7–15.4)
WBC: 10.1 10*3/uL (ref 3.4–10.8)

## 2019-12-28 LAB — RPR: RPR Ser Ql: NONREACTIVE

## 2019-12-28 LAB — GLUCOSE TOLERANCE, 1 HOUR: Glucose, 1Hr PP: 119 mg/dL (ref 65–199)

## 2020-01-01 DIAGNOSIS — Z029 Encounter for administrative examinations, unspecified: Secondary | ICD-10-CM

## 2020-01-02 ENCOUNTER — Ambulatory Visit (HOSPITAL_COMMUNITY): Admission: RE | Admit: 2020-01-02 | Payer: Medicaid Other | Source: Ambulatory Visit

## 2020-01-02 ENCOUNTER — Ambulatory Visit (HOSPITAL_COMMUNITY): Payer: Medicaid Other

## 2020-01-02 ENCOUNTER — Encounter: Payer: Medicaid Other | Admitting: Obstetrics & Gynecology

## 2020-01-02 NOTE — Progress Notes (Deleted)
   Patient did not show up today for her scheduled appointment.   Verita Schneiders, MD, [place] for [employer], [place]

## 2020-01-03 ENCOUNTER — Ambulatory Visit: Payer: Medicaid Other | Admitting: Gastroenterology

## 2020-01-03 ENCOUNTER — Ambulatory Visit (HOSPITAL_COMMUNITY): Payer: Medicaid Other

## 2020-01-03 ENCOUNTER — Inpatient Hospital Stay (HOSPITAL_COMMUNITY): Admission: RE | Admit: 2020-01-03 | Payer: Medicaid Other | Source: Ambulatory Visit

## 2020-01-04 ENCOUNTER — Telehealth: Payer: Self-pay | Admitting: *Deleted

## 2020-01-04 NOTE — Telephone Encounter (Signed)
-----   Message from Loralie Champagne, PA-C sent at 01/03/2020  9:24 AM EDT ----- Patient needs to be contacted.  She has same-day canceled and no-showed for multiple visits (3 since 07/2019) and was last seen here in November 2019.  She is not going to be able to continue to receive her medication from our office if she is not seen.  Please save this to the chart after patient is contacted.  Thank you,  Jess

## 2020-01-04 NOTE — Telephone Encounter (Signed)
Called patient and confirmed identity, once I said I was calling from [place] patient hung up on me. Called number in chart again and was sent straighter to voicemail. Left patient a voicemail to call office. Will send mychart message to inform patient we can no longer refill her Sulfasalazine until she is seen in the office.

## 2020-01-08 ENCOUNTER — Encounter: Payer: Self-pay | Admitting: General Practice

## 2020-01-24 ENCOUNTER — Emergency Department (HOSPITAL_COMMUNITY)
Admission: EM | Admit: 2020-01-24 | Discharge: 2020-01-24 | Disposition: A | Discharge status: Home / Self Care | Payer: Medicaid Other | Attending: Emergency Medicine | Admitting: Emergency Medicine

## 2020-01-24 ENCOUNTER — Emergency Department (HOSPITAL_COMMUNITY): Payer: Medicaid Other

## 2020-01-24 ENCOUNTER — Encounter (HOSPITAL_COMMUNITY): Payer: Self-pay | Admitting: *Deleted

## 2020-01-24 ENCOUNTER — Other Ambulatory Visit: Payer: Self-pay

## 2020-01-24 DIAGNOSIS — Z3A33 33 weeks gestation of pregnancy: Secondary | ICD-10-CM | POA: Insufficient documentation

## 2020-01-24 DIAGNOSIS — O99513 Diseases of the respiratory system complicating pregnancy, third trimester: Secondary | ICD-10-CM | POA: Diagnosis not present

## 2020-01-24 DIAGNOSIS — J45909 Unspecified asthma, uncomplicated: Secondary | ICD-10-CM | POA: Insufficient documentation

## 2020-01-24 DIAGNOSIS — O99891 Other specified diseases and conditions complicating pregnancy: Secondary | ICD-10-CM | POA: Insufficient documentation

## 2020-01-24 DIAGNOSIS — R1011 Right upper quadrant pain: Secondary | ICD-10-CM | POA: Insufficient documentation

## 2020-01-24 DIAGNOSIS — F909 Attention-deficit hyperactivity disorder, unspecified type: Secondary | ICD-10-CM | POA: Insufficient documentation

## 2020-01-24 DIAGNOSIS — R4789 Other speech disturbances: Secondary | ICD-10-CM

## 2020-01-24 DIAGNOSIS — R519 Headache, unspecified: Secondary | ICD-10-CM | POA: Insufficient documentation

## 2020-01-24 DIAGNOSIS — O0990 Supervision of high risk pregnancy, unspecified, unspecified trimester: Secondary | ICD-10-CM

## 2020-01-24 DIAGNOSIS — Z79899 Other long term (current) drug therapy: Secondary | ICD-10-CM | POA: Diagnosis not present

## 2020-01-24 LAB — BASIC METABOLIC PANEL
Anion gap: 9 (ref 5–15)
BUN: 5 mg/dL — ABNORMAL LOW (ref 6–20)
CO2: 19 mmol/L — ABNORMAL LOW (ref 22–32)
Calcium: 8.7 mg/dL — ABNORMAL LOW (ref 8.9–10.3)
Chloride: 108 mmol/L (ref 98–111)
Creatinine, Ser: 0.6 mg/dL (ref 0.44–1.00)
GFR calc Af Amer: >60 mL/min (ref >60)
GFR calc non Af Amer: >60 mL/min (ref >60)
Glucose, Bld: 90 mg/dL (ref 70–99)
Potassium: 4.1 mmol/L (ref 3.5–5.1)
Sodium: 136 mmol/L (ref 135–145)

## 2020-01-24 LAB — CBC WITH DIFFERENTIAL/PLATELET
Abs Immature Granulocytes: 0.04 10*3/uL (ref 0.00–0.07)
Basophils Absolute: 0 10*3/uL (ref 0.0–0.1)
Basophils Relative: 0 %
Eosinophils Absolute: 0.1 10*3/uL (ref 0.0–0.5)
Eosinophils Relative: 1 %
HCT: 35.7 % — ABNORMAL LOW (ref 36.0–46.0)
Hemoglobin: 11.7 g/dL — ABNORMAL LOW (ref 12.0–15.0)
Immature Granulocytes: 0 %
Lymphocytes Relative: 15 %
Lymphs Abs: 1.4 10*3/uL (ref 0.7–4.0)
MCH: 31 pg (ref 26.0–34.0)
MCHC: 32.8 g/dL (ref 30.0–36.0)
MCV: 94.4 fL (ref 80.0–100.0)
Monocytes Absolute: 0.6 10*3/uL (ref 0.1–1.0)
Monocytes Relative: 7 %
Neutro Abs: 6.8 10*3/uL (ref 1.7–7.7)
Neutrophils Relative %: 77 %
Platelets: 244 10*3/uL (ref 150–400)
RBC: 3.78 MIL/uL — ABNORMAL LOW (ref 3.87–5.11)
RDW: 13.8 % (ref 11.5–15.5)
WBC: 9 10*3/uL (ref 4.0–10.5)
nRBC: 0 % (ref 0.0–0.2)

## 2020-01-24 LAB — HEPATIC FUNCTION PANEL
ALT: 17 U/L (ref 0–44)
AST: 17 U/L (ref 15–41)
Albumin: 2.5 g/dL — ABNORMAL LOW (ref 3.5–5.0)
Alkaline Phosphatase: 46 U/L (ref 38–126)
Bilirubin, Direct: <0.1 mg/dL (ref 0.0–0.2)
Total Bilirubin: 0.1 mg/dL — ABNORMAL LOW (ref 0.3–1.2)
Total Protein: 6.2 g/dL — ABNORMAL LOW (ref 6.5–8.1)

## 2020-01-24 LAB — URINALYSIS, ROUTINE W REFLEX MICROSCOPIC
Bilirubin Urine: NEGATIVE
Glucose, UA: NEGATIVE mg/dL
Hgb urine dipstick: NEGATIVE
Ketones, ur: NEGATIVE mg/dL
Leukocytes,Ua: NEGATIVE
Nitrite: NEGATIVE
Protein, ur: NEGATIVE mg/dL
Specific Gravity, Urine: 1.016 (ref 1.005–1.030)
pH: 7 (ref 5.0–8.0)

## 2020-01-24 LAB — PROTEIN / CREATININE RATIO, URINE
Creatinine, Urine: 137.22 mg/dL
Protein Creatinine Ratio: 0.11 mg/mg{Cre} (ref 0.00–0.15)
Total Protein, Urine: 15 mg/dL

## 2020-01-24 LAB — RAPID URINE DRUG SCREEN, HOSP PERFORMED
Amphetamines: NOT DETECTED
Barbiturates: NOT DETECTED
Benzodiazepines: NOT DETECTED
Cocaine: NOT DETECTED
Opiates: NOT DETECTED
Tetrahydrocannabinol: NOT DETECTED

## 2020-01-24 LAB — LIPASE, BLOOD: Lipase: 23 U/L (ref 11–51)

## 2020-01-24 LAB — CBG MONITORING, ED: Glucose-Capillary: 95 mg/dL (ref 70–99)

## 2020-01-24 MED ORDER — SODIUM CHLORIDE 0.9 % IV BOLUS
1000.0000 mL | Freq: Once | INTRAVENOUS | Status: AC
  Administered 2020-01-24: 1000 mL via INTRAVENOUS

## 2020-01-24 MED ORDER — ACETAMINOPHEN 500 MG PO TABS
1000.0000 mg | ORAL_TABLET | Freq: Once | ORAL | Status: AC
  Administered 2020-01-24: 1000 mg via ORAL
  Filled 2020-01-24: qty 2
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
## 2020-01-24 NOTE — Consult Note (Signed)
Neurology Consultation  Reason for Consult: Code stroke secondary to abnormal speech Referring Physician: Dr. Gilford Raid  CC: Stuttering speech  History is obtained from: Patient and EMS  HPI: Sydney Deleon is a 29 y.o. female with history of PTSD, depression, bipolar disease, ADHD, and anxiety.  Patient is [redacted] weeks pregnant, and called the secretary at her OB/GYN.  The nurse at the OB/GYN office told her to call EMS and fear she may have be having a stroke.  At the bridge patient was noted to have stuttering speech and no other neuro deficits.  Due to the fact that she is [redacted] weeks pregnant we did not do a CT scan in addition patient speech was not indicative of stroke.  Code stroke was canceled.  In route blood pressure is 130/96, blood glucose was 102.  Chart review (patient has no past neurology notes)  LKW: 8:30 AM tpa given?: no, NIH stroke scale of 0 Premorbid modified Rankin scale (mRS): 0 NIH stroke score: 0   Past Medical History:  Diagnosis Date  . Acid reflux   . ADHD   . Allergy   . Anxiety   . Arthritis   . Asthma   . Bipolar 1 disorder ([place])   . Bipolar disease, chronic ([place])    "since a child"    . Chronic bronchitis ([place])   . Depression   . Miscarriage within last 12 months   . PTSD (post-traumatic stress disorder)   . Ulcerative colitis ([place])    Family History  Problem Relation Age of Onset  . Hypertension Mother   . Depression Mother   . Throat cancer Paternal Grandfather   . Colon cancer Cousin        2 cousins  . Breast cancer Cousin   . Rectal cancer Cousin   . Breast cancer Cousin   . Esophageal cancer Neg Hx   . Stomach cancer Neg Hx    Social History:   reports that she has never smoked. She has never used smokeless tobacco. She reports that she does not drink alcohol or use drugs.  Medications No current facility-administered medications for this encounter.  Current Outpatient Medications:  .  albuterol (PROVENTIL HFA;VENTOLIN HFA)  108 (90 Base) MCG/ACT inhaler, Inhale 2 puffs into the lungs every 4 (four) hours as needed for wheezing or shortness of breath., Disp: 1 Inhaler, Rfl: 0 .  Blood Pressure Monitoring (BLOOD PRESSURE KIT) DEVI, 1 Device by Does not apply route daily. Large Cuff  ICD 10: Z34.00, Disp: 1 each, Rfl: 0 .  citalopram (CELEXA) 40 MG tablet, Take 1 tablet (40 mg total) by mouth daily., Disp: 90 tablet, Rfl: 0 .  fluticasone (FLONASE) 50 MCG/ACT nasal spray, Place 1 spray into both nostrils daily. (Patient not taking: Reported on 11/22/2019), Disp: 16 g, Rfl: 0 .  folic acid (FOLVITE) 956 MCG tablet, Take 400 mcg by mouth daily., Disp: , Rfl:  .  ondansetron (ZOFRAN ODT) 4 MG disintegrating tablet, Take 1 tablet (4 mg total) by mouth every 8 (eight) hours as needed for nausea or vomiting. (Patient not taking: Reported on 12/27/2019), Disp: 5 tablet, Rfl: 0 .  Prenatal Vit-Fe Fumarate-FA (PRENATAL MULTIVITAMIN) TABS tablet, Take 1 tablet by mouth daily at 12 noon., Disp: , Rfl:  .  Probiotic Product (PHILLIPS COLON HEALTH PO), Take 1 capsule by mouth daily. , Disp: , Rfl:  .  raNITIdine HCl (ZANTAC PO), Take 75 mg by mouth 2 (two) times daily. , Disp: , Rfl:  .  sulfaSALAzine (AZULFIDINE) 500 MG tablet, Take 500 mg by mouth 2 (two) times daily. , Disp: , Rfl:  .  Vitamin D, Ergocalciferol, (DRISDOL) 1.25 MG (50000 UNIT) CAPS capsule, Take 1 capsule (50,000 Units total) by mouth every 7 (seven) days for 12 doses., Disp: 12 capsule, Rfl: 0  ROS:    General ROS: negative for - chills, fatigue, fever, night sweats, weight gain or weight loss Psychological ROS: negative for - behavioral disorder, hallucinations, memory difficulties, mood swings or suicidal ideation Ophthalmic ROS: negative for - blurry vision, double vision, eye pain or loss of vision ENT ROS: negative for - epistaxis, nasal discharge, oral lesions, sore throat, tinnitus or vertigo Allergy and Immunology ROS: negative for - hives or itchy/watery  eyes Hematological and Lymphatic ROS: negative for - bleeding problems, bruising or swollen lymph nodes Endocrine ROS: negative for - galactorrhea, hair pattern changes, polydipsia/polyuria or temperature intolerance Respiratory ROS: negative for - cough, hemoptysis, shortness of breath or wheezing Cardiovascular ROS: negative for - chest pain, dyspnea on exertion, edema or irregular heartbeat Gastrointestinal ROS: negative for - abdominal pain, diarrhea, hematemesis, nausea/vomiting or stool incontinence Genito-Urinary ROS: negative for - dysuria, hematuria, incontinence or urinary frequency/urgency Musculoskeletal ROS: negative for - joint swelling or muscular weakness Neurological ROS: as noted in HPI Dermatological ROS: negative for rash and skin lesion changes  Exam: Current vital signs: BP 133/79   Pulse 99   Resp (!) 22   LMP 06/01/2019   SpO2 99%  Vital signs in last 24 hours: Pulse Rate:  [99] 99 (04/08 1126) Resp:  [16-22] 22 (04/08 1126) BP: (133)/(79) 133/79 (04/08 1125) SpO2:  [99 %] 99 % (04/08 1126)   Constitutional: Appears well-developed and well-nourished.  Eyes: No scleral injection HENT: No OP obstrucion Head: Normocephalic.  Cardiovascular: Normal rate and regular rhythm.  Respiratory: Effort normal, non-labored breathing GI: Soft.  No distension. There is no tenderness.  Skin: WDI  Neuro: Mental Status: Patient is awake, alert, oriented to person, place, month, year, and situation. Speech-intact naming, repeating, comprehension.  No aphasia or dysarthria.  Positive stuttering speech Patient is able to give a clear and coherent history. Cranial Nerves: II: Visual Fields are full.  III,IV, VI: EOMI without ptosis or diploplia. Pupils equal, round and reactive to light V: Facial sensation is symmetric to temperature VII: Facial movement is symmetric.  VIII: hearing is intact to voice X: Palat elevates symmetrically XI: Shoulder shrug is  symmetric. XII: tongue is midline without atrophy or fasciculations.  Motor: Tone is normal. Bulk is normal. 5/5 strength was present in all four extremities.  No drift No aterixis Sensory: Sensation is symmetric to light touch and temperature in the arms and legs. Deep Tendon Reflexes: 2+ and symmetric in the biceps and patellae. Plantars: Toes are downgoing bilaterally.  Cerebellar: FNF normal  Labs I have reviewed labs in epic and the results pertinent to this consultation are:  CBC    Component Value Date/Time   WBC 10.1 12/27/2019 1517   WBC 11.9 (H) 10/15/2019 2031   RBC 3.57 (L) 12/27/2019 1517   RBC 3.96 10/15/2019 2031   HGB 11.1 12/27/2019 1517   HCT 31.5 (L) 12/27/2019 1517   PLT 261 12/27/2019 1517   MCV 88 12/27/2019 1517   MCV 91 11/21/2014 1830   MCH 31.1 12/27/2019 1517   MCH 31.3 10/15/2019 2031   MCHC 35.2 12/27/2019 1517   MCHC 34.4 10/15/2019 2031   RDW 13.0 12/27/2019 1517   RDW 13.7 11/21/2014 1830  LYMPHSABS 1.7 11/07/2019 1516   LYMPHSABS 2.2 11/21/2014 1830   MONOABS 0.9 11/10/2018 2100   MONOABS 0.7 11/21/2014 1830   EOSABS 0.1 11/07/2019 1516   EOSABS 0.3 11/21/2014 1830   BASOSABS 0.0 11/07/2019 1516   BASOSABS 0.0 11/21/2014 1830    CMP     Component Value Date/Time   NA 138 11/07/2019 1516   NA 140 11/21/2014 1830   K 4.1 11/07/2019 1516   K 4.0 11/21/2014 1830   CL 105 11/07/2019 1516   CL 108 (H) 11/21/2014 1830   CO2 18 (L) 11/07/2019 1516   CO2 26 11/21/2014 1830   GLUCOSE 106 (H) 11/07/2019 1516   GLUCOSE 104 (H) 07/23/2019 1411   GLUCOSE 96 11/21/2014 1830   BUN 5 (L) 11/07/2019 1516   BUN 11 11/21/2014 1830   CREATININE 0.62 11/07/2019 1516   CREATININE 0.65 11/21/2014 1830   CALCIUM 9.3 11/07/2019 1516   CALCIUM 8.9 11/21/2014 1830   PROT 6.9 11/07/2019 1516   PROT 7.8 12/26/2013 0824   ALBUMIN 3.6 (L) 11/07/2019 1516   ALBUMIN 3.4 12/26/2013 0824   AST 18 11/07/2019 1516   AST 30 12/26/2013 0824   ALT 11  11/07/2019 1516   ALT 20 12/26/2013 0824   ALKPHOS 44 11/07/2019 1516   ALKPHOS 35 (L) 12/26/2013 0824   BILITOT <0.2 11/07/2019 1516   BILITOT 0.3 12/26/2013 0824   GFRNONAA 121 11/07/2019 1516   GFRNONAA >60 11/21/2014 1830   GFRNONAA >60 06/13/2014 1517   GFRAA 140 11/07/2019 1516   GFRAA >60 11/21/2014 1830   GFRAA >60 06/13/2014 1517    Imaging I have reviewed the images obtained:  Etta Quill PA-C [facility] [phone]  M-F  (9:00 am- 5:00 PM)  01/24/2020, 11:30 AM   Assessment:  Is a 29 year old female who is [redacted] weeks pregnant with sudden onset of slow and stuttering speech.  Otherwise neuro exam is nonlocalizing or lateralizing.  Speech quality is not indicative of stroke at this time.  Impression: -anxiety  Recommendations: -At this time no stroke work-up is recommended.  NEUROHOSPITALIST ADDENDUM Performed a face to face diagnostic evaluation at time of code stroke  .   I have reviewed the contents of history and physical exam as documented by PA/ARNP/Resident and agree with above documentation.  I have discussed and formulated the above plan as documented. Edits to the note have been made as needed.  Patient with history of anxiety presents with stuttering speech that began at 8:30 AM.  No signs of aphasia, able to answer questions and speak in sentences with stuttering of words.  No other focal deficits.  No visual field deficits, no facial asymmetry, no neglect, no weakness.   Stuttering speech is due to anxiety. Will cancel code stroke.      Karena Addison Lochlin Eppinger MD [facility] [phone]   If 7pm to 7am, please call on call as listed on AMION.

## 2020-01-24 NOTE — ED Notes (Signed)
Patient given discharge instructions patient verbalizes understanding. 

## 2020-01-24 NOTE — Code Documentation (Signed)
Patient ([redacted] weeks pregnant) arrived to [facility] ED from home via EMS where she lives with her sister and mom. Pt's sister called the OB after pt's speech was stuttering. The OB had her family call 911. [facility] initiated the code stroke after arriving to patient. Stroke team, EDP and OB Rapid Response in the ED when pt arrived. Patient's NIHSS is a 0 on arrival. She does have stuttering speech. CT was deferred by Neurologist after assessment. Code Stroke cancelled at 1125. Herman Mell, Rande Brunt

## 2020-01-24 NOTE — ED Notes (Signed)
Pt transported to Ultrasound.

## 2020-01-24 NOTE — Progress Notes (Addendum)
65 Notified by Dr. Ilda Basset of OB FP of this patient coming in to ED after calling in to MFM with report of stroke like symptoms. Dr. Ilda Basset requests OBRR evaluation with NST and preeclampsia evaluation. 1130 Pt moved to ED room. Here to evaluate this 29 yo G2P0 @ 33.[redacted] wks GA in with report of "stroke" symptoms.  Patient has difficulty with speech. Patient has stuttering speech. 1140 Upon further questioning, patient also reports right sided headache and pain in RUQ abdomen. Denies blurry vision. 1216 FHR Category I. Dr. Ilda Basset updated on above. Orders to change BMP to CMP and add UDS and protein/ creatinine ratio. EDP notified of requests from Dr. Ilda Basset.

## 2020-01-24 NOTE — ED Triage Notes (Signed)
EMS reported patient was on the phone with her OB GYN, patient is [redacted] weeks pregnant and OB was concern for "slurred speech" over the phone.

## 2020-01-25 ENCOUNTER — Other Ambulatory Visit (HOSPITAL_COMMUNITY): Payer: Self-pay | Admitting: *Deleted

## 2020-01-25 ENCOUNTER — Encounter: Payer: Self-pay | Admitting: Neurology

## 2020-01-25 ENCOUNTER — Ambulatory Visit (HOSPITAL_COMMUNITY): Payer: Medicaid Other | Admitting: *Deleted

## 2020-01-25 ENCOUNTER — Ambulatory Visit (HOSPITAL_COMMUNITY)
Admission: RE | Admit: 2020-01-25 | Discharge: 2020-01-25 | Disposition: A | Discharge status: Home / Self Care | Payer: Medicaid Other | Source: Ambulatory Visit | Attending: Obstetrics and Gynecology | Admitting: Obstetrics and Gynecology

## 2020-01-25 ENCOUNTER — Encounter (HOSPITAL_COMMUNITY): Payer: Self-pay

## 2020-01-25 ENCOUNTER — Other Ambulatory Visit (HOSPITAL_COMMUNITY): Payer: Self-pay | Admitting: Obstetrics and Gynecology

## 2020-01-25 ENCOUNTER — Ambulatory Visit (INDEPENDENT_AMBULATORY_CARE_PROVIDER_SITE_OTHER): Payer: Self-pay | Admitting: Neurology

## 2020-01-25 VITALS — BP 114/79 | HR 102 | Ht 65.0 in | Wt 289.0 lb

## 2020-01-25 DIAGNOSIS — R4789 Other speech disturbances: Secondary | ICD-10-CM

## 2020-01-25 DIAGNOSIS — E669 Obesity, unspecified: Secondary | ICD-10-CM | POA: Diagnosis not present

## 2020-01-25 DIAGNOSIS — O359XX0 Maternal care for (suspected) fetal abnormality and damage, unspecified, not applicable or unspecified: Secondary | ICD-10-CM

## 2020-01-25 DIAGNOSIS — R449 Unspecified symptoms and signs involving general sensations and perceptions: Secondary | ICD-10-CM

## 2020-01-25 DIAGNOSIS — O99213 Obesity complicating pregnancy, third trimester: Secondary | ICD-10-CM

## 2020-01-25 DIAGNOSIS — Z363 Encounter for antenatal screening for malformations: Secondary | ICD-10-CM

## 2020-01-25 DIAGNOSIS — O365910 Maternal care for other known or suspected poor fetal growth, first trimester, not applicable or unspecified: Secondary | ICD-10-CM | POA: Insufficient documentation

## 2020-01-25 DIAGNOSIS — Z3A34 34 weeks gestation of pregnancy: Secondary | ICD-10-CM

## 2020-01-25 DIAGNOSIS — O0990 Supervision of high risk pregnancy, unspecified, unspecified trimester: Secondary | ICD-10-CM

## 2020-01-25 DIAGNOSIS — F449 Dissociative and conversion disorder, unspecified: Secondary | ICD-10-CM

## 2020-01-25 DIAGNOSIS — O358XX0 Maternal care for other (suspected) fetal abnormality and damage, not applicable or unspecified: Secondary | ICD-10-CM | POA: Insufficient documentation

## 2020-01-25 DIAGNOSIS — Z362 Encounter for other antenatal screening follow-up: Secondary | ICD-10-CM

## 2020-01-25 NOTE — Progress Notes (Signed)
Pt with stuttered speech that started yesterday. Denies headache or visual changes. Evaluated at [facility] on 01/24/20 and discharged home.

## 2020-01-25 NOTE — Progress Notes (Signed)
NEUROLOGY CONSULTATION NOTE  Sydney Deleon MRN: [id number] DOB: Apr 11, 1991  Referring provider: Dr. Johnell Comings Primary care provider: Wilfred Lacy, NP  Reason for consult:  Change in speech  Dear Dr Annamaria Boots:  Thank you for your kind referral of Sydney Deleon for consultation of the above symptoms. Although her history is well known to you, please allow me to reiterate it for the purpose of our medical record. She is alone in the office today. Records and images were personally reviewed where available.   HISTORY OF PRESENT ILLNESS: This is a 29 year old G2P0 woman currently at 63 weeks AOG with a history of bipolar disorder, anxiety, depression, PTSD, ADHD, presenting for evaluation of speech changes that started yesterday. 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 She called her mother and told her she could not move, her mother called EMS and she was noted to be repeating the same word twice. She reports that it went from her body being stiff to me talking like this. Speech is stuttering and effortful with interrupted flow. She was brought to the ER where she had normal bloodwork and urinalysis, UDS negative. She was discharged home and saw her OB today who requested for an urgent evaluation.  She has a history of migraines since childhood and took Topiramate until 2011, then was having migraines around once a month. Pain is usually over the right hemisphere, feeling like her brain is going to explode. She had a slight headache yesterday that did not progress after she was given Tylenol. She has a little pain today that comes and goes. She has dizziness and gets very tired, stating that "to be honest, it feels like my body wants to shut down." She has pains in her leg, her left left feels like it is going to sleep. She has had these symptoms for a while, affecting either leg, feeling like she wants to  move her legs. Her arms occasional feel like deadweight. Her vision got a little worse last month after she started a new job. 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 She reports right-sided neck stiffness. She has not been sleeping well the past year, waking up every 2 hours, with an average of 4-5 hours of sleep. She feels drowsy during the day. She reports that work is very stressful, she was in a "somewhat stressful" phone call yesterday when the episode occurred. She has a psychiatrist and reports that she was previously on Abilify, stopped when she was pregnant. She continues on citalopram 60m daily. She reports a miscarriage 2 years ago. She lives with her mother and sister. No family history of similar symptoms.   PAST MEDICAL HISTORY: Past Medical History:  Diagnosis Date  . Acid reflux   . ADHD   . Allergy   . Anxiety   . Arthritis   . Asthma   . Bipolar 1 disorder ([place]   . [place] ([place]    "since a child"    . Chronic bronchitis ([place]   . Depression   . Miscarriage within last 12 months   . PTSD (post-traumatic stress disorder)   . Ulcerative colitis ([place]     PAST SURGICAL HISTORY: Past Surgical History:  Procedure Laterality Date  . TONSILECTOMY/ADENOIDECTOMY WITH MYRINGOTOMY      MEDICATIONS: Current Outpatient Medications on File Prior to Visit  Medication Sig  Dispense Refill  . albuterol (PROVENTIL HFA;VENTOLIN HFA) 108 (90 Base) MCG/ACT inhaler Inhale 2 puffs into the lungs every 4 (four) hours as needed for wheezing or shortness of breath. 1 Inhaler 0  . Blood Pressure Monitoring (BLOOD PRESSURE KIT) DEVI 1 Device by Does not apply route daily. Large Cuff  ICD 10: Z34.00 1 each 0  . citalopram (CELEXA) 40 MG tablet Take 1 tablet (40 mg total) by mouth daily. 90 tablet 0  . famotidine (PEPCID) 20 MG tablet Take 20 mg by mouth 2 (two) times daily.    . fluticasone (FLONASE) 50 MCG/ACT nasal spray Place 1 spray into  both nostrils daily. 16 g 0  . folic acid (FOLVITE) 423 MCG tablet Take 400 mcg by mouth daily.    . ondansetron (ZOFRAN ODT) 4 MG disintegrating tablet Take 1 tablet (4 mg total) by mouth every 8 (eight) hours as needed for nausea or vomiting. 5 tablet 0  . Prenatal Vit-Fe Fumarate-FA (PRENATAL MULTIVITAMIN) TABS tablet Take 1 tablet by mouth every evening.     . sulfaSALAzine (AZULFIDINE) 500 MG tablet Take 500 mg by mouth 2 (two) times daily.     . Vitamin D, Ergocalciferol, (DRISDOL) 1.25 MG (50000 UNIT) CAPS capsule Take 1 capsule (50,000 Units total) by mouth every 7 (seven) days for 12 doses. 12 capsule 0   No current facility-administered medications on file prior to visit.    ALLERGIES: Allergies  Allergen Reactions  . Prednisone Other (See Comments)    Suicidal thoughts, tremors, face paralysis, homicidal thoughts,caused mental health issues    FAMILY HISTORY: Family History  Problem Relation Age of Onset  . Hypertension Mother   . Depression Mother   . Throat cancer Paternal Grandfather   . Colon cancer Cousin        2 cousins  . Breast cancer Cousin   . Rectal cancer Cousin   . Breast cancer Cousin   . Esophageal cancer Neg Hx   . Stomach cancer Neg Hx     SOCIAL HISTORY: Social History   Socioeconomic History  . Marital status: Single    Spouse name: Not on file  . Number of children: Not on file  . Years of education: Not on file  . Highest education level: Not on file  Occupational History  . Not on file  Tobacco Use  . Smoking status: Never Smoker  . Smokeless tobacco: Never Used  Substance and Sexual Activity  . Alcohol use: No  . Drug use: No  . Sexual activity: Yes    Birth control/protection: None    Comment: on period now  Other Topics Concern  . Not on file  Social History Narrative  . Not on file   Social Determinants of Health   Financial Resource Strain:   . Difficulty of Paying Living Expenses:   Food Insecurity:   . Worried  About Charity fundraiser in the Last Year:   . Arboriculturist in the Last Year:   Transportation Needs:   . Film/video editor (Medical):   Marland Kitchen Lack of Transportation (Non-Medical):   Physical Activity:   . Days of Exercise per Week:   . Minutes of Exercise per Session:   Stress:   . Feeling of Stress :   Social Connections:   . Frequency of Communication with Friends and Family:   . Frequency of Social Gatherings with Friends and Family:   . Attends Religious Services:   . Active Member of Clubs  or Organizations:   . Attends Archivist Meetings:   Marland Kitchen Marital Status:   Intimate Partner Violence:   . Fear of Current or Ex-Partner:   . Emotionally Abused:   Marland Kitchen Physically Abused:   . Sexually Abused:     REVIEW OF SYSTEMS: Constitutional: No fevers, chills, or sweats, no generalized fatigue, change in appetite Eyes: No visual changes, double vision, eye pain Ear, nose and throat: No hearing loss, ear pain, nasal congestion, sore throat Cardiovascular: No chest pain, palpitations Respiratory:  No shortness of breath at rest or with exertion, wheezes GastrointestinaI: No nausea, vomiting, diarrhea, abdominal pain, fecal incontinence Genitourinary:  No dysuria, urinary retention or frequency Musculoskeletal:  + neck pain, back pain Integumentary: No rash, pruritus, skin lesions Neurological: as above Psychiatric: + depression, insomnia, anxiety Endocrine: No palpitations, fatigue, diaphoresis, mood swings, change in appetite, change in weight, increased thirst Hematologic/Lymphatic:  No anemia, purpura, petechiae. Allergic/Immunologic: no itchy/runny eyes, nasal congestion, recent allergic reactions, rashes  PHYSICAL EXAM: Vitals:   01/25/20 1349  BP: 114/79  Pulse: (!) 102  SpO2: 97%   General: No acute distress, flat affect Head:  Normocephalic/atraumatic Skin/Extremities: No rash, no edema Neurological Exam: Mental status: alert and oriented to person,  place, and time. She has stuttering and effortful speech fluttering her eyelids during times of increased effort, with interrupted flow. No aphasia. Fund of knowledge is appropriate.  Recent and remote memory are intact.  Attention and concentration are normal.   Cranial nerves: CN I: not tested CN II: pupils equal, round and reactive to light, visual fields intact CN III, IV, VI:  full range of motion, no nystagmus, no ptosis CN V: she reports temperature testing on the right "feels like water" CN VII: upper and lower face symmetric CN VIII: hearing intact to conversation CN IX, X: gag intact, uvula midline CN XI: sternocleidomastoid and trapezius muscles intact CN XII: tongue midline Bulk & Tone: normal, no fasciculations. Motor: 5/5 throughout with no pronator drift. Sensation: reports that temperature testing on the right arm and leg "feels like water" compared to cold feeling on left side.  Deep Tendon Reflexes: +2 throughout, no ankle clonus Cerebellar: no incoordination on finger to nose testing Gait: wide-based, slow and cautious, no ataxia Tremor: none   IMPRESSION: This is a 29 year old G2P0 woman currently at 71 weeks AOG with a history of bipolar disorder, anxiety, depression, PTSD, ADHD, presenting for evaluation of speech changes that started yesterday. Her neurological exam is non-focal except she reports subjective sensory changes on the right face, arm, and leg. Speech changes as described above were discussed with patient, consistent with psychogenic speech disorder. We discussed how stress reactions can present this way, she endorses a lot of stress with her new job and pregnancy. She was encouraged to reach out to her psychiatrist and see a therapist as well. Head CT without contrast will be ordered due to subjective sensory changes, suspicion for underlying intracranial abnormality is low. Follow-up as needed, she knows to call for any changes.   Thank you for allowing me  to participate in the care of this patient. Please do not hesitate to call for any questions or concerns.   Ellouise Newer, M.D.  CC: Dr. Annamaria Boots, Wilfred Lacy, NP

## 2020-01-25 NOTE — Patient Instructions (Signed)
1. Schedule head CT without contrast  2. Speech changes such as these are usually a stress reaction. Discuss with psychiatry, recommend talking to a therapist as well

## 2020-02-05 ENCOUNTER — Encounter (HOSPITAL_COMMUNITY): Payer: Self-pay | Admitting: *Deleted

## 2020-02-05 ENCOUNTER — Encounter: Payer: Self-pay | Admitting: Advanced Practice Midwife

## 2020-02-05 DIAGNOSIS — R479 Unspecified speech disturbances: Secondary | ICD-10-CM | POA: Insufficient documentation

## 2020-02-05 DIAGNOSIS — F54 Psychological and behavioral factors associated with disorders or diseases classified elsewhere: Secondary | ICD-10-CM | POA: Insufficient documentation

## 2020-02-07 ENCOUNTER — Telehealth: Payer: Self-pay | Admitting: *Deleted

## 2020-02-07 ENCOUNTER — Ambulatory Visit (INDEPENDENT_AMBULATORY_CARE_PROVIDER_SITE_OTHER): Payer: Medicaid Other | Admitting: Obstetrics & Gynecology

## 2020-02-07 ENCOUNTER — Other Ambulatory Visit (HOSPITAL_COMMUNITY)
Admission: RE | Admit: 2020-02-07 | Discharge: 2020-02-07 | Disposition: A | Discharge status: Home / Self Care | Payer: Medicaid Other | Source: Ambulatory Visit | Attending: Obstetrics & Gynecology | Admitting: Obstetrics & Gynecology

## 2020-02-07 ENCOUNTER — Other Ambulatory Visit: Payer: Self-pay

## 2020-02-07 VITALS — BP 131/82 | HR 101 | Wt 288.8 lb

## 2020-02-07 DIAGNOSIS — Z3A35 35 weeks gestation of pregnancy: Secondary | ICD-10-CM

## 2020-02-07 DIAGNOSIS — O365910 Maternal care for other known or suspected poor fetal growth, first trimester, not applicable or unspecified: Secondary | ICD-10-CM

## 2020-02-07 DIAGNOSIS — O0990 Supervision of high risk pregnancy, unspecified, unspecified trimester: Secondary | ICD-10-CM | POA: Diagnosis not present

## 2020-02-07 DIAGNOSIS — O283 Abnormal ultrasonic finding on antenatal screening of mother: Secondary | ICD-10-CM

## 2020-02-07 DIAGNOSIS — O358XX0 Maternal care for other (suspected) fetal abnormality and damage, not applicable or unspecified: Secondary | ICD-10-CM

## 2020-02-07 DIAGNOSIS — R479 Unspecified speech disturbances: Secondary | ICD-10-CM

## 2020-02-07 DIAGNOSIS — O365930 Maternal care for other known or suspected poor fetal growth, third trimester, not applicable or unspecified: Secondary | ICD-10-CM

## 2020-02-07 NOTE — Patient Instructions (Addendum)
AREA PEDIATRIC/FAMILY PRACTICE PHYSICIANS  Central/[place]outheast [place] [phone]) . [facility] Davy Pique, MD; Gwendlyn Deutscher, MD; Walker Kehr, MD; Andria Frames, MD; McDiarmid, MD; Dutch Quint, MD; Nori Riis, MD; [place]ingo Amber, [address] [place], [place] [address] o [phone] o Mon-Fri 8:30-12:30, 1:30-5:00 o Providers come to see babies at [facility] o Accepting Medicaid . [place] at [place] providers who accept newborns: Dorthy Cooler, MD; Orland Mustard, MD; Stephanie Acre, MD o [place], [place], [place] [address] o [phone] o Mon-Fri 8:00-5:30 o Babies seen by providers at [facility] o Does NOT accept Medicaid o Please call early in hospitalization for appointment (limited availability)  . Mustard Rogers, MD o [address], [place], [place] [address] o [phone] o Mon, Tue, Thur, Fri 8:30-5:00, Wed 10:00-7:00 (closed 1-2pm) o Babies seen by [facility] providers o Accepting Medicaid . [place], MD o [place], [place], [place] [address] o [phone] o Mon-Fri 8:30-5:00, Sat 8:30-12:00 o Provider comes to see babies at [place] Medicaid o Must have been referred from current patients or contacted office prior to delivery . [facility] ([place] [facility] [place]) Franne Forts, MD; Tamera Punt, MD; Doneen Poisson, MD; Fatima Sanger, MD; Wynetta Emery, MD; Jess Barters, MD; Tami Ribas, MD; Herbert Moors, MD; Derrell Lolling, MD; Dorothyann Peng, MD; Lucious Groves, NP; Baldo Ash, NP o [place], [place], [place] [address] o [phone] o Mon, Tue, Thur, Fri 8:30-5:30, Wed 9:30-5:30, Sat 8:30-12:30 o Babies seen by [facility] providers o Accepting Medicaid o Only accepting infants of first-time parents or siblings of current patients [facility] discharge coordinator will make follow-up appointment . Baltazar Najjar o [place] [address],  [place], [place]  [address] o [phone]   Fax - [phone] . [facility] o [address], [place], [place]  [address] o Phone - [phone]   Fax [phone] . [place], [place], [place], [place]  [address] o [phone]  East/Northeast [place] [phone]) . [facility] Reginal Lutes, MD; Jacklynn Ganong, MD; Torrie Mayers, MD; MD; Rosana Hoes, MD; Servando Salina, MD; Rose Fillers, MD; Rex Kras, MD; Corinna Capra, MD; Volney American, MD; Trilby Drummer, MD; Janann Colonel, MD; Jimmye Norman, [place], [place], [place] [address] o [phone] o Mon-Fri 8:30-5:00 (extended evenings Mon-Thur as needed), Sat-Sun 10:00-1:00 o Providers come to see babies at [place] Medicaid for families of first-time babies and families with all children in the household age 72 and under. Must register with office prior to making appointment (M-F only). . [place], NP; Tomi Bamberger, MD; Redmond School, MD; [place], [place] [address] [place], [place] [address] o [phone] o Mon-Fri 8:00-5:00 o Babies seen by providers at [facility] o Does NOT accept Medicaid/Commercial Insurance Only . [facility] [place] ([facility])  Marnee Guarneri, MD; Drema Dallas, MD; Montine Circle, MD; Vilma Prader, MD; Vanita Panda, MD; Alfonso Ramus, MD; Ruthann Cancer, MD; Roxanne Mins, MD; Rosalva Ferron, MD; Polly Cobia, MD o [address] [place], [place] [address] o [phone] o Mon-Fri 8:30-5:30, Sat (Oct.-Mar.) 9:00-1:00 o Babies seen by providers at [place] [phone]) . [facility] Peers, MD; Suzan Slick, MD o [address], [place], [place] [address] o [phone] o Mon-Fri 8:30-5:00, Sat 8:30-12:00 o Providers come to see babies at [facility] o Does NOT accept Medicaid . [facility]  [facility] Ricci Barker, PA; Mannie Stabile, MD; [place], [place]; Nancy Fetter, MD; Moreen Fowler, [place],  [place], [place] [address] o [phone] o Mon-Fri 8:00-5:00 o Babies seen by providers at [facility] o Does NOT accept Medicaid o Only accepting babies of parents who are patients o Please call early in hospitalization for appointment (limited availability) . [facility] Blanca Friend, MD; Sharlene Motts, MD; Rod Can, MD; Warner Mccreedy, NP; Sabra Heck, MD; Ermalinda Memos, MD; Sharlett Iles, NP; Aurther Loft, MD; Jerrye Beavers, MD; Marcello Moores, MD; Berline Lopes, MD; Charolette Forward, MD o [place]. [place], [place], [place] [address] o [phone] o Mon-Fri 8:00-5:00, Sat 9:00-12:00 o Providers come to see babies at [facility] o Does NOT accept [facility] [phone]) . [place] providers accepting new patients: Dayna Ramus, NP; [place], [place], [place], [place] [address] o [phone] o Mon-Fri 8:00-5:00 o Babies seen by providers at [facility] o Does NOT accept Medicaid o Only accepting babies of parents who are patients o Please call early in hospitalization for appointment (limited availability) . [facility] Oswaldo Conroy, MD; Sheran Lawless, MD o [address] [place], [place] [address] o [phone] (press 1 to schedule appointment) o Mon-Fri 8:00-5:00 o Providers come to see babies at [facility] o Does NOT accept Medicaid . [facility] Jodi Mourning, MD o [address] [place], [place] [address] o [phone] o Mon-Fri 8:30-5:00 (lunch 12:30-1:00), extended hours by appointment only Wed 5:00-6:30 o Babies seen by [facility] providers o Accepting Medicaid . [place] at Evalyn Casco, MD; [place], MD; Ethlyn Gallery, MD o [place], [place], [place] [address] o [phone] o Mon-Fri 8:00-5:00 o Babies seen by [facility] providers o Does NOT accept Medicaid . Therapist, music at [place], MD; Yong Channel, MD; [place], [place]., [place], [place]  [address] o [phone] o Mon-Fri 8:00-5:00 o Babies seen by [facility] providers o Does NOT accept Medicaid . [place], [place]; [place], [place]; [place], NP; Albertina Parr, MD; Frederic Jericho, MD; Ronney Lion, MD; Carlos Levering, NP; Jerelene Redden, NP; Tomasita Crumble, NP; Ronelle Nigh, NP; Corinna Lines, MD; [place], MD o [address]., [place], [place] [address] o [phone] o Mon-Fri 8:30-5:00, Sat 10:00-1:00 o Providers come to see babies at [facility] o Does NOT accept Medicaid o Free prenatal information session Tuesdays at 4:45pm . [facility] Porfirio Oar, MD; [place], [place]; [place], [place]; [place], [place]., [place] [address] o [phone] o Mon-Fri 7:30-5:30 o Babies seen by [facility] providers . [facility] o [address], [place], [place], [place]  [address] o [phone]   Fax - [phone]  [place] [phone] & [phone]) . [place], [place] o [address] [place], [place] [address] o [phone] o Mon-Thur 8:00-6:00 o Providers come to see babies at [place] Medicaid . [place], NP; Melford Aase, MD; [place], [place]; [place], [place]., [place], [place] [address] o [phone] o Mon-Thur 7:30-7:30, Fri 7:30-4:30 o Babies seen by [facility] providers o Accepting Medicaid . [facility] Nyra Jabs, MD; Cristino Martes, NP; Gertie Baron, MD o [place] [address], [place], [place] [address] o [phone] o Mon-Fri 8:30-5:00, Sat 8:30-12:00 o Providers come to see babies at [place] Medicaid o Must have "Meet & Greet" appointment at office prior to delivery . [place] ([place]) o [place],  MD; Juleen China, MD; Clydene Laming, [place] [address], [place], [place] [address] o [phone] o Mon-Wed 8:00-6:00, Thur-Fri 8:00-5:00, Sat 9:00-12:00 o Providers come to  see babies at [facility] o Does NOT accept Medicaid o Only accepting siblings of current patients . [facility], [place], [place], [place]  [address] o [phone]   Fax [phone] . [place] at [place] [address], [place], [place]  [address] o [phone]   Fax - [place] [phone] & [phone]) . Therapist, music at [place], DO; [place], [place]., [place], [place] [address] o [phone] o Mon-Fri 7:00-5:00 o Babies seen by [facility] providers o Does NOT accept Medicaid . [place], MD; [place], [place]; [place], [place], [place], [place] [address] o [phone] o Mon-Fri 8:00-5:00 o Babies seen by [facility] providers o Accepting Medicaid . [place], MD; [place], [place]; Kimberly, NP; [place], [place], [place], [place] [address] o [phone] o Mon-Fri 8:00-5:00 o Babies seen by providers at [place] [phone]) . [facility] [place], [place] o [address] [place], [place] [address] o [phone] o Mon-Fri 8:00-5:00 o Babies seen by [facility] providers o Does NOT accept Medicaid o Limited availability, please call early in hospitalization to schedule follow-up . [facility] Leilani Merl, PA; Maisie Fus, MD; [place], MD; [place], [place]; Jeannine Kitten, MD; [place], [place] [facility] [address], [place], [place] [address] o [phone] o Mon-Fri 8:30-5:00, Sat 9:00-12:00 o Babies seen by providers at [facility] o Accepting Medicaid o Please register online then schedule online or call office o [email or web address] . [place] ([place] at  [place]) Kristian Covey, NP; Dwyane Dee, MD; Leonidas Romberg, PA o [address] [place], [place], [place] [address] o [phone] o Mon-Fri 8:00-5:00 o Babies seen by providers at [facility] o Accepting Medicaid . [place] ([facility] [employer]) Dairl Ponder, MD; Rayvon Char, NP; Melina Modena, MD o [address] [place], [place], [place] [address] o [phone] o Mon-Fri 8:00-5:30, Sat&Sun by appointment (phones open at 8:30) o Babies seen by [facility] providers o Accepting Medicaid o Must be a first-time baby or sibling of current patient . [place], [address], [place], [place]  [address] o [phone]   Fax - [phone]  [place] [phone] & [phone]) . [place], [place]; [place], [place]; Benjamine Mola, MD; [place], [place]; Harrell Lark, MD o [address] [place], [place] [address] o [phone] o Mon-Thur 8:00-7:00, Fri 8:00-5:00, Sat 8:00-12:00, Sun 9:00-12:00 o Babies seen by [facility] providers o Accepting Medicaid . [facility] at [facility], MD; Ruthann Cancer, MD; [facility], MD o [address], [place], [place] [address] o [phone] o Mon-Thur 8:00-5:00 o Babies seen by providers at [facility] o Accepting Medicaid . [facility] at [place], MD; Coe[place], MD; Amedeo Plenty, MD; Bobby Rumpf, MD; [place], MD; Lavonia Drafts, MD; Ruthann Cancer, MD; Selinda Eon, MD; Audie Box, MD; Jim Like, MD; Christie Nottingham, MD; Hubbard Hartshorn, MD; Modena Nunnery, MD o [address] [place] [address] [place], [place]  [address] o [phone] o Mon-Fri 8:00-5:30, Sat (Oct.-Mar.) 9:00-1:00 o Babies seen by providers at [facility] o Accepting Medicaid o Must fill out new patient packet, available online at [email or web address] . [place] ([facility]) Barnabas Lister, NP; Kenton Kingfisher, NP; Claiborne Billings, NP; Rolla Plate, MD;  [place], [place]; Carola Rhine, MD; Tyron Russell, MD; Delia Chimes, NP o [address], [place], [place] [address] o [phone] o Mon-Thur 8:00-5:30, Fri 8:00-5:00 o Babies seen by providers at [place] [phone]) . [place], [place]; [place], MD; Dennard Schaumann, MD; [place], [place] o [address] [place], [place] [address] o [phone] o Mon-Fri 8:00-5:00 o Babies seen by providers at [place] [phone]) . [facility] [place], [place]; Olen Pel, MD; [place], [place], [place], [place] [address] o [phone] o Mon-Fri 8:00-5:00 o Babies seen by providers at [facility] o Does NOT accept Medicaid o Limited appointment availability, please call early in hospitalization  . Therapist, music at [place], [place]; [place], [address] [place], [place] [address] o [phone] o Mon-Fri 8:00-5:00 o Babies seen by [facility] providers o Does NOT accept Medicaid . [facility] Su Grand, MD; Guy Sandifer, MD; [place], [place]; [place], [place] [address], [place], [place] [address] o [phone] o Mon-Fri 8:00-5:00 o After hours clinic [facility] [place], [place] [address]) [phone] Mon-Fri 5:00-8:00, Sat 12:00-6:00, Sun 10:00-4:00 o Babies seen by [facility] providers o Accepting Medicaid . [place] [facility] o [address] [place], [place]  [address] o [phone]   Fax - [phone]  [place] [phone]) . Therapist, music at [facility], MD o [address] [place], [place], [place] [address] o [phone] o Mon-Fri 8:00-5:00 o Babies seen by [facility] providers o Does NOT accept Medicaid . [place] ([place] [facility] [place]) Bing Neighbors, MD o [address] [place], [place], [place]  [address] o [phone] o Mon-Thur 8:00-7:00, Fri 8:00-5:00, Sat 8:00-12:00 o Babies seen by providers at [facility] o Accepting Medicaid - but does not have vaccinations in office (must be received elsewhere) o Limited availability, please call early in hospitalization  [place] ([phone]) . [place], MD o [address], [place] [address] o [phone]  Fax [phone]  Cesarean Delivery Cesarean birth, or cesarean delivery, is the surgical delivery of a baby through an incision in the abdomen and the uterus. This may be referred to as a C-section. This procedure may be scheduled ahead of time, or it may be done in an emergency situation. Tell a health care provider about:  Any allergies you have.  All medicines you are taking, including vitamins, herbs, eye drops, creams, and over-the-counter medicines.  Any problems you or family members have had with anesthetic medicines.  Any blood disorders you have.  Any surgeries you have had.  Any medical conditions you have.  Whether you or any members of your family have a history of deep vein thrombosis (DVT) or pulmonary embolism (PE). What are the risks? Generally, this is a safe procedure. However, problems may occur, including:  Infection.  Bleeding.  Allergic reactions to medicines.  Damage to other structures or organs.  Blood clots.  Injury to  your baby. What happens before the procedure? General instructions  Follow instructions from your health care provider about eating or drinking restrictions.  If you know that you are going to have a cesarean delivery, do not shave your pubic area. Shaving before the procedure may increase your risk of infection.  Plan to have someone take you home from the hospital.  Ask your health care provider what steps will be taken to prevent infection. These may include: ? Removing hair at the surgery site. ? Washing skin with a  germ-killing soap. ? Taking antibiotic medicine.  Depending on the reason for your cesarean delivery, you may have a physical exam or additional testing, such as an ultrasound.  You may have your blood or urine tested. Questions for your health care provider  Ask your health care provider about: ? Changing or stopping your regular medicines. This is especially important if you are taking diabetes medicines or blood thinners. ? Your pain management plan. This is especially important if you plan to breastfeed your baby. ? How long you will be in the hospital after the procedure. ? Any concerns you may have about receiving blood products, if you need them during the procedure. ? Cord blood banking, if you plan to collect your baby's umbilical cord blood.  You may also want to ask your health care provider: ? Whether you will be able to hold or breastfeed your baby while you are still in the operating room. ? Whether your baby can stay with you immediately after the procedure and during your recovery. ? Whether a family member or a person of your choice can go with you into the operating room and stay with you during the procedure, immediately after the procedure, and during your recovery. What happens during the procedure?   An IV will be inserted into one of your veins.  Fluid and medicines, such as antibiotics, will be given before the surgery.  Fetal monitors will be placed on your abdomen to check your baby's heart rate.  You may be given a special warming gown to wear to keep your temperature stable.  A catheter may be inserted into your bladder through your urethra. This drains your urine during the procedure.  You may be given one or more of the following: ? A medicine to numb the area (local anesthetic). ? A medicine to make you fall asleep (general anesthetic). ? A medicine (regional anesthetic) that is injected into your back or through a small thin tube placed in your back  (spinal anesthetic or epidural anesthetic). This numbs everything below the injection site and allows you to stay awake during your procedure. If this makes you feel nauseous, tell your health care provider. Medicines will be available to help reduce any nausea you may feel.  An incision will be made in your abdomen, and then in your uterus.  If you are awake during your procedure, you may feel tugging and pulling in your abdomen, but you should not feel pain. If you feel pain, tell your health care provider immediately.  Your baby will be removed from your uterus. You may feel more pressure or pushing while this happens.  Immediately after birth, your baby will be dried and kept warm. You may be able to hold and breastfeed your baby.  The umbilical cord may be clamped and cut during this time. This usually occurs after waiting a period of 1-2 minutes after delivery.  Your placenta will be removed from your uterus.  Your incisions will be closed with stitches (sutures). Staples, skin glue, or adhesive strips may also be applied to the incision in your abdomen.  Bandages (dressings) may be placed over the incision in your abdomen. The procedure may vary among health care providers and hospitals. What happens after the procedure?  Your blood pressure, heart rate, breathing rate, and blood oxygen level will be monitored until you are discharged from the hospital.  You may continue to receive fluids and medicines through an IV.  You will have some pain. Medicines will be available to help control your pain.  To help prevent blood clots: ? You may be given medicines. ? You may have to wear compression stockings or devices. ? You will be encouraged to walk around when you are able.  Hospital staff will encourage and support bonding with your baby. Your hospital may have you and your baby to stay in the same room (rooming in) during your hospital stay to encourage successful bonding and  breastfeeding.  You may be encouraged to cough and breathe deeply often. This helps to prevent lung problems.  If you have a catheter draining your urine, it will be removed as soon as possible after your procedure. Summary  Cesarean birth, or cesarean delivery, is the surgical delivery of a baby through an incision in the abdomen and the uterus.  Follow instructions from your health care provider about eating or drinking restrictions before the procedure.  You will have some pain after the procedure. Medicines will be available to help control your pain.  Hospital staff will encourage and support bonding with your baby after the procedure. Your hospital may have you and your baby to stay in the same room (rooming in) during your hospital stay to encourage successful bonding and breastfeeding. This information is not intended to replace advice given to you by your health care provider. Make sure you discuss any questions you have with your health care provider. Document Revised: 04/10/2018 Document Reviewed: 04/10/2018 Elsevier Patient Education  [place].

## 2020-02-07 NOTE — Telephone Encounter (Signed)
A female stating she was calling for [facility] left a message 02/06/20 pm stating she is calling  because she has an appointment 02/07/20 and wants to see if someone did an escort letter for her to come with [facility] . Linda,RN
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
## 2020-02-07 NOTE — Telephone Encounter (Signed)
Patient seen in office today and Mother accompanied to appointment because Katasha has communication issues  and has difficulty expressing herself . Discussed issue with registrar and exception was made to allow her to accompany her. Maleak Brazzel,RN

## 2020-02-08 LAB — GC/CHLAMYDIA PROBE AMP (~~LOC~~) NOT AT ARMC
Chlamydia: NEGATIVE
Comment: NEGATIVE
Comment: NORMAL
Neisseria Gonorrhea: NEGATIVE

## 2020-02-11 LAB — CULTURE, BETA STREP (GROUP B ONLY): Strep Gp B Culture: NEGATIVE

## 2020-02-12 ENCOUNTER — Telehealth (HOSPITAL_COMMUNITY): Payer: Self-pay | Admitting: *Deleted

## 2020-02-12 NOTE — Telephone Encounter (Signed)
Preadmission screen  

## 2020-02-13 ENCOUNTER — Encounter (HOSPITAL_COMMUNITY): Payer: Self-pay

## 2020-02-13 NOTE — Patient Instructions (Addendum)
Sydney Deleon  02/13/2020   Your procedure is scheduled on:  02/23/2020  Arrive at 53 at Entrance C on [address] at [facility]  and [address]. You are invited to use the FREE valet parking or use the Visitor's parking deck.  Pick up the phone at the desk and dial [phone].  Call this number if you have problems the morning of surgery: [phone]  Remember:   Do not eat food:(After Midnight) Desps de medianoche.  Do not drink clear liquids: (After Midnight) Desps de medianoche.  Take these medicines the morning of surgery with A SIP OF WATER:  Take your celexa and your sulfasalazine   Do not wear jewelry, make-up or nail polish.  Do not wear lotions, powders, or perfumes. Do not wear deodorant.  Do not shave 48 hours prior to surgery.  Do not bring valuables to the hospital.  [facility] is not   responsible for any belongings or valuables brought to the hospital.  Contacts, dentures or bridgework may not be worn into surgery.  Leave suitcase in the car. After surgery it may be brought to your room.  For patients admitted to the hospital, checkout time is 11:00 AM the day of              discharge.      Please read over the following fact sheets that you were given:     Preparing for Surgery

## 2020-02-19 NOTE — H&P (Signed)
Sydney Deleon is an 29 y.o. G53P0010 female.   Chief Complaint: Pregnancy at 38 wks HPI: Here today for primary Cesarean section due to presumed skeletal dysplasia without formal diagnosis as yet and on-going IUGR.  Past Medical History:  Diagnosis Date  . Acid reflux   . ADHD   . Allergy   . Anemia   . Anxiety   . Asthma   . Bipolar 1 disorder ([place])   . Bipolar disease, chronic ([place])    "since a child"    . Chronic bronchitis ([place])   . Depression   . Miscarriage within last 12 months   . PTSD (post-traumatic stress disorder)   . Ulcerative colitis [facility])     Past Surgical History:  Procedure Laterality Date  . TONSILLECTOMY AND ADENOIDECTOMY      Family History  Problem Relation Age of Onset  . Hypertension Mother   . Depression Mother   . Colon cancer Cousin        2 cousins  . Breast cancer Cousin   . Rectal cancer Cousin   . Breast cancer Cousin   . Esophageal cancer Neg Hx   . Stomach cancer Neg Hx    Social History:  reports that she has never smoked. She has never used smokeless tobacco. She reports that she does not drink alcohol or use drugs.  Allergies:  Allergies  Allergen Reactions  . Prednisone Other (See Comments)    Suicidal thoughts, tremors, face paralysis, homicidal thoughts,caused mental health issues    No medications prior to admission.    A comprehensive review of systems was negative.  Last menstrual period 06/01/2019. General appearance: alert, cooperative and appears stated age Head: Normocephalic, without obvious abnormality, atraumatic Neck: supple, symmetrical, trachea midline Lungs: normal effort Heart: regular rate and rhythm Abdomen: gravid, non-tender Extremities: Homans sign is negative, no sign of DVT Skin: Skin color, texture, turgor normal. No rashes or lesions Neurologic: Grossly normal   Lab Results  Component Value Date   WBC 9.0 01/24/2020   HGB 11.7 (L) 01/24/2020   HCT 35.7 (L) 01/24/2020   MCV 94.4  01/24/2020   PLT 244 01/24/2020    Assessment/Plan Active Problems:   Anxiety and depression   Bipolar 1 disorder (HCC)   Ulcerative colitis ([place])   IUGR (intrauterine growth restriction) affecting care of mother   Abnormal fetal ultrasound   Skeletal dysplasia, fetal, affecting care of mother, antepartum  For primary Cesarean section due to skeletal dysplasia. Risks include but are not limited to bleeding, infection, injury to surrounding structures, including bowel, bladder and ureters, blood clots, and death.  Likelihood of success is high. Possible poor prognosis, reviewed previously with MFM.    Sydney Deleon 02/19/2020, 10:17 AM

## 2020-02-20 ENCOUNTER — Ambulatory Visit: Payer: Medicaid Other

## 2020-02-21 ENCOUNTER — Ambulatory Visit (INDEPENDENT_AMBULATORY_CARE_PROVIDER_SITE_OTHER): Payer: Medicaid Other | Admitting: Obstetrics and Gynecology

## 2020-02-21 ENCOUNTER — Other Ambulatory Visit: Payer: Self-pay

## 2020-02-21 ENCOUNTER — Other Ambulatory Visit (HOSPITAL_COMMUNITY)
Admission: RE | Admit: 2020-02-21 | Discharge: 2020-02-21 | Disposition: A | Discharge status: Home / Self Care | Payer: Medicaid Other | Source: Ambulatory Visit | Attending: Family Medicine | Admitting: Family Medicine

## 2020-02-21 VITALS — BP 131/81 | HR 105 | Wt 292.4 lb

## 2020-02-21 DIAGNOSIS — O0990 Supervision of high risk pregnancy, unspecified, unspecified trimester: Secondary | ICD-10-CM

## 2020-02-21 DIAGNOSIS — O99513 Diseases of the respiratory system complicating pregnancy, third trimester: Secondary | ICD-10-CM

## 2020-02-21 DIAGNOSIS — Z20822 Contact with and (suspected) exposure to covid-19: Secondary | ICD-10-CM | POA: Diagnosis not present

## 2020-02-21 DIAGNOSIS — O09893 Supervision of other high risk pregnancies, third trimester: Secondary | ICD-10-CM

## 2020-02-21 DIAGNOSIS — Z01812 Encounter for preprocedural laboratory examination: Secondary | ICD-10-CM | POA: Diagnosis present

## 2020-02-21 DIAGNOSIS — Z3A37 37 weeks gestation of pregnancy: Secondary | ICD-10-CM

## 2020-02-21 DIAGNOSIS — J45909 Unspecified asthma, uncomplicated: Secondary | ICD-10-CM

## 2020-02-21 HISTORY — DX: Anemia, unspecified: D64.9

## 2020-02-21 LAB — CBC
HCT: 36.9 % (ref 36.0–46.0)
Hemoglobin: 12.1 g/dL (ref 12.0–15.0)
MCH: 30.3 pg (ref 26.0–34.0)
MCHC: 32.8 g/dL (ref 30.0–36.0)
MCV: 92.5 fL (ref 80.0–100.0)
Platelets: 243 10*3/uL (ref 150–400)
RBC: 3.99 MIL/uL (ref 3.87–5.11)
RDW: 13.7 % (ref 11.5–15.5)
WBC: 7.2 10*3/uL (ref 4.0–10.5)
nRBC: 0 % (ref 0.0–0.2)

## 2020-02-21 LAB — RPR: RPR Ser Ql: NONREACTIVE

## 2020-02-21 LAB — ABO/RH: ABO/RH(D): A POS

## 2020-02-21 LAB — TYPE AND SCREEN
ABO/RH(D): A POS
Antibody Screen: NEGATIVE

## 2020-02-21 LAB — SARS CORONAVIRUS 2 (TAT 6-24 HRS): SARS Coronavirus 2: NEGATIVE
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
## 2020-02-21 NOTE — MAU Note (Signed)
Asymptomatic, swab collected. Lab called

## 2020-02-22 ENCOUNTER — Ambulatory Visit: Payer: Medicaid Other

## 2020-02-22 NOTE — Anesthesia Preprocedure Evaluation (Addendum)
Anesthesia Evaluation  Patient identified by MRN, date of birth, ID band Patient awake    Reviewed: Allergy & Precautions, NPO status , Patient's Chart, lab work & pertinent test results  History of Anesthesia Complications Negative for: history of anesthetic complications  Airway Mallampati: II  TM Distance: >3 FB Neck ROM: Full    Dental no notable dental hx.    Pulmonary asthma ,    Pulmonary exam normal        Cardiovascular negative cardio ROS Normal cardiovascular exam     Neuro/Psych Anxiety Depression Bipolar Disorder negative neurological ROS     GI/Hepatic Neg liver ROS, GERD  Controlled and Medicated,Ulcerative colitis   Endo/Other  Morbid obesity  Renal/GU negative Renal ROS  negative genitourinary   Musculoskeletal negative musculoskeletal ROS (+)   Abdominal   Peds  Hematology negative hematology ROS (+)   Anesthesia Other Findings Day of surgery medications reviewed with patient.  Reproductive/Obstetrics (+) Pregnancy Fetal skeletal dysplasia                            Anesthesia Physical Anesthesia Plan  ASA: III  Anesthesia Plan: Spinal   Post-op Pain Management:    Induction:   PONV Risk Score and Plan: 4 or greater and Treatment may vary due to age or medical condition, Ondansetron and Dexamethasone  Airway Management Planned: Natural Airway  Additional Equipment: None  Intra-op Plan:   Post-operative Plan:   Informed Consent: I have reviewed the patients History and Physical, chart, labs and discussed the procedure including the risks, benefits and alternatives for the proposed anesthesia with the patient or authorized representative who has indicated his/her understanding and acceptance.       Plan Discussed with: CRNA  Anesthesia Plan Comments:        Anesthesia Quick Evaluation

## 2020-02-23 ENCOUNTER — Encounter (HOSPITAL_COMMUNITY): Payer: Self-pay | Admitting: Family Medicine

## 2020-02-23 ENCOUNTER — Other Ambulatory Visit: Payer: Self-pay

## 2020-02-23 ENCOUNTER — Encounter (HOSPITAL_COMMUNITY)
Admission: RE | Disposition: A | Discharge status: Home / Self Care | Payer: Self-pay | Source: Home / Self Care | Attending: Obstetrics and Gynecology

## 2020-02-23 ENCOUNTER — Inpatient Hospital Stay (HOSPITAL_COMMUNITY)
Admission: RE | Admit: 2020-02-23 | Discharge: 2020-02-26 | DRG: 787 | Disposition: A | Discharge status: Home / Self Care | Payer: Medicaid Other | Attending: Obstetrics and Gynecology | Admitting: Obstetrics and Gynecology

## 2020-02-23 ENCOUNTER — Inpatient Hospital Stay (HOSPITAL_COMMUNITY): Payer: Medicaid Other | Admitting: Anesthesiology

## 2020-02-23 DIAGNOSIS — O358XX0 Maternal care for other (suspected) fetal abnormality and damage, not applicable or unspecified: Principal | ICD-10-CM

## 2020-02-23 DIAGNOSIS — F419 Anxiety disorder, unspecified: Secondary | ICD-10-CM | POA: Diagnosis present

## 2020-02-23 DIAGNOSIS — O99344 Other mental disorders complicating childbirth: Secondary | ICD-10-CM | POA: Diagnosis present

## 2020-02-23 DIAGNOSIS — O320XX0 Maternal care for unstable lie, not applicable or unspecified: Secondary | ICD-10-CM | POA: Diagnosis not present

## 2020-02-23 DIAGNOSIS — O365990 Maternal care for other known or suspected poor fetal growth, unspecified trimester, not applicable or unspecified: Secondary | ICD-10-CM | POA: Diagnosis present

## 2020-02-23 DIAGNOSIS — F319 Bipolar disorder, unspecified: Secondary | ICD-10-CM | POA: Diagnosis present

## 2020-02-23 DIAGNOSIS — O283 Abnormal ultrasonic finding on antenatal screening of mother: Secondary | ICD-10-CM | POA: Diagnosis present

## 2020-02-23 DIAGNOSIS — O321XX0 Maternal care for breech presentation, not applicable or unspecified: Secondary | ICD-10-CM | POA: Diagnosis present

## 2020-02-23 DIAGNOSIS — O99214 Obesity complicating childbirth: Secondary | ICD-10-CM | POA: Diagnosis present

## 2020-02-23 DIAGNOSIS — Z3A38 38 weeks gestation of pregnancy: Secondary | ICD-10-CM | POA: Diagnosis not present

## 2020-02-23 DIAGNOSIS — O9962 Diseases of the digestive system complicating childbirth: Secondary | ICD-10-CM | POA: Diagnosis present

## 2020-02-23 DIAGNOSIS — K5190 Ulcerative colitis, unspecified, without complications: Secondary | ICD-10-CM | POA: Diagnosis present

## 2020-02-23 DIAGNOSIS — O365930 Maternal care for other known or suspected poor fetal growth, third trimester, not applicable or unspecified: Secondary | ICD-10-CM | POA: Diagnosis present

## 2020-02-23 DIAGNOSIS — O365910 Maternal care for other known or suspected poor fetal growth, first trimester, not applicable or unspecified: Secondary | ICD-10-CM | POA: Diagnosis present

## 2020-02-23 DIAGNOSIS — F32A Depression, unspecified: Secondary | ICD-10-CM | POA: Diagnosis present

## 2020-02-23 DIAGNOSIS — F329 Major depressive disorder, single episode, unspecified: Secondary | ICD-10-CM | POA: Diagnosis present

## 2020-02-23 SURGERY — Surgical Case
Anesthesia: Spinal

## 2020-02-23 MED ORDER — MORPHINE SULFATE (PF) 0.5 MG/ML IJ SOLN
INTRAMUSCULAR | Status: AC
  Filled 2020-02-23: qty 10

## 2020-02-23 MED ORDER — SOD CITRATE-CITRIC ACID 500-334 MG/5ML PO SOLN
30.0000 mL | ORAL | Status: AC
  Administered 2020-02-23: 30 mL via ORAL

## 2020-02-23 MED ORDER — PHENYLEPHRINE HCL-NACL 20-0.9 MG/250ML-% IV SOLN
INTRAVENOUS | Status: AC
  Filled 2020-02-23: qty 250

## 2020-02-23 MED ORDER — HYDROMORPHONE HCL 1 MG/ML IJ SOLN: 0.2000 mg | INTRAMUSCULAR | Status: DC | PRN

## 2020-02-23 MED ORDER — DIPHENHYDRAMINE HCL 25 MG PO CAPS
25.0000 mg | ORAL_CAPSULE | ORAL | Status: DC | PRN
  Administered 2020-02-23 – 2020-02-24 (×2): 25 mg via ORAL

## 2020-02-23 MED ORDER — ONDANSETRON HCL 4 MG/2ML IJ SOLN
4.0000 mg | Freq: Three times a day (TID) | INTRAMUSCULAR | Status: DC | PRN
  Administered 2020-02-23: 4 mg via INTRAVENOUS
  Filled 2020-02-23: qty 2

## 2020-02-23 MED ORDER — OXYCODONE HCL 5 MG PO TABS
5.0000 mg | ORAL_TABLET | ORAL | Status: DC | PRN
  Administered 2020-02-23: 10 mg via ORAL
  Administered 2020-02-24 (×2): 5 mg via ORAL
  Administered 2020-02-24: 10 mg via ORAL
  Administered 2020-02-24: 5 mg via ORAL
  Administered 2020-02-25 (×2): 10 mg via ORAL
  Filled 2020-02-23: qty 2
  Filled 2020-02-23: qty 1
  Filled 2020-02-23 (×3): qty 2
  Filled 2020-02-23 (×2): qty 1

## 2020-02-23 MED ORDER — DIPHENHYDRAMINE HCL 50 MG/ML IJ SOLN: 12.5000 mg | INTRAMUSCULAR | Status: DC | PRN

## 2020-02-23 MED ORDER — KETOROLAC TROMETHAMINE 30 MG/ML IJ SOLN: 30.0000 mg | Freq: Four times a day (QID) | INTRAMUSCULAR | Status: AC | PRN

## 2020-02-23 MED ORDER — ACETAMINOPHEN 500 MG PO TABS
1000.0000 mg | ORAL_TABLET | ORAL | Status: AC
  Administered 2020-02-23: 1000 mg via ORAL

## 2020-02-23 MED ORDER — PHENYLEPHRINE HCL-NACL 20-0.9 MG/250ML-% IV SOLN
INTRAVENOUS | Status: DC | PRN
  Administered 2020-02-23: 60 ug/min via INTRAVENOUS

## 2020-02-23 MED ORDER — MENTHOL 3 MG MT LOZG: 1.0000 | LOZENGE | OROMUCOSAL | Status: DC | PRN

## 2020-02-23 MED ORDER — LACTATED RINGERS IV SOLN: INTRAVENOUS | Status: DC

## 2020-02-23 MED ORDER — SODIUM CHLORIDE 0.9 % IV SOLN
INTRAVENOUS | Status: DC | PRN
Start: 2020-02-23 — End: 2020-02-23

## 2020-02-23 MED ORDER — SOD CITRATE-CITRIC ACID 500-334 MG/5ML PO SOLN
ORAL | Status: AC
  Filled 2020-02-23: qty 30

## 2020-02-23 MED ORDER — ONDANSETRON HCL 4 MG/2ML IJ SOLN
INTRAMUSCULAR | Status: AC
  Filled 2020-02-23: qty 2

## 2020-02-23 MED ORDER — OXYTOCIN 40 UNITS IN NORMAL SALINE INFUSION - SIMPLE MED
INTRAVENOUS | Status: AC
  Filled 2020-02-23: qty 1000

## 2020-02-23 MED ORDER — BUPIVACAINE IN DEXTROSE 0.75-8.25 % IT SOLN
INTRATHECAL | Status: DC | PRN
  Administered 2020-02-23: 1.6 mL via INTRATHECAL

## 2020-02-23 MED ORDER — PRENATAL MULTIVITAMIN CH
1.0000 | ORAL_TABLET | Freq: Every day | ORAL | Status: DC
  Administered 2020-02-23 – 2020-02-26 (×4): 1 via ORAL
  Filled 2020-02-23 (×4): qty 1

## 2020-02-23 MED ORDER — LACTATED RINGERS IV SOLN
INTRAVENOUS | Status: DC | PRN
Start: 2020-02-23 — End: 2020-02-23

## 2020-02-23 MED ORDER — KETOROLAC TROMETHAMINE 30 MG/ML IJ SOLN
30.0000 mg | Freq: Once | INTRAMUSCULAR | Status: AC
  Administered 2020-02-23: 30 mg via INTRAVENOUS

## 2020-02-23 MED ORDER — NALBUPHINE HCL 10 MG/ML IJ SOLN: 5.0000 mg | INTRAMUSCULAR | Status: DC | PRN

## 2020-02-23 MED ORDER — DIPHENHYDRAMINE HCL 25 MG PO CAPS
25.0000 mg | ORAL_CAPSULE | Freq: Four times a day (QID) | ORAL | Status: DC | PRN
  Filled 2020-02-23 (×2): qty 1

## 2020-02-23 MED ORDER — GABAPENTIN 300 MG PO CAPS
300.0000 mg | ORAL_CAPSULE | ORAL | Status: AC
  Administered 2020-02-23: 300 mg via ORAL

## 2020-02-23 MED ORDER — SIMETHICONE 80 MG PO CHEW
80.0000 mg | CHEWABLE_TABLET | ORAL | Status: DC
  Administered 2020-02-24 – 2020-02-26 (×3): 80 mg via ORAL
  Filled 2020-02-23 (×3): qty 1

## 2020-02-23 MED ORDER — IBUPROFEN 800 MG PO TABS
800.0000 mg | ORAL_TABLET | Freq: Four times a day (QID) | ORAL | Status: DC
  Administered 2020-02-24 – 2020-02-26 (×10): 800 mg via ORAL
  Filled 2020-02-23 (×10): qty 1

## 2020-02-23 MED ORDER — SODIUM CHLORIDE 0.9% FLUSH: 3.0000 mL | INTRAVENOUS | Status: DC | PRN

## 2020-02-23 MED ORDER — OXYTOCIN 40 UNITS IN NORMAL SALINE INFUSION - SIMPLE MED
INTRAVENOUS | Status: DC | PRN
  Administered 2020-02-23: 40 [IU] via INTRAVENOUS

## 2020-02-23 MED ORDER — ACETAMINOPHEN 500 MG PO TABS
1000.0000 mg | ORAL_TABLET | Freq: Four times a day (QID) | ORAL | Status: DC
  Filled 2020-02-23: qty 2

## 2020-02-23 MED ORDER — GABAPENTIN 300 MG PO CAPS
ORAL_CAPSULE | ORAL | Status: AC
  Filled 2020-02-23: qty 1

## 2020-02-23 MED ORDER — FENTANYL CITRATE (PF) 100 MCG/2ML IJ SOLN
INTRAMUSCULAR | Status: AC
  Filled 2020-02-23: qty 2

## 2020-02-23 MED ORDER — FENTANYL CITRATE (PF) 100 MCG/2ML IJ SOLN: 25.0000 ug | INTRAMUSCULAR | Status: DC | PRN

## 2020-02-23 MED ORDER — KETOROLAC TROMETHAMINE 30 MG/ML IJ SOLN: 30.0000 mg | Freq: Once | INTRAMUSCULAR | Status: DC

## 2020-02-23 MED ORDER — NALBUPHINE HCL 10 MG/ML IJ SOLN: 5.0000 mg | Freq: Once | INTRAMUSCULAR | Status: DC | PRN

## 2020-02-23 MED ORDER — SENNOSIDES-DOCUSATE SODIUM 8.6-50 MG PO TABS
2.0000 | ORAL_TABLET | ORAL | Status: DC
  Administered 2020-02-24 – 2020-02-26 (×3): 2 via ORAL
  Filled 2020-02-23 (×3): qty 2

## 2020-02-23 MED ORDER — DEXTROSE 5 % IV SOLN
3.0000 g | INTRAVENOUS | Status: AC
  Administered 2020-02-23: 3 g via INTRAVENOUS

## 2020-02-23 MED ORDER — ACETAMINOPHEN 500 MG PO TABS
ORAL_TABLET | ORAL | Status: AC
  Filled 2020-02-23: qty 2

## 2020-02-23 MED ORDER — KETOROLAC TROMETHAMINE 30 MG/ML IJ SOLN
INTRAMUSCULAR | Status: AC
  Filled 2020-02-23: qty 1

## 2020-02-23 MED ORDER — ACETAMINOPHEN 500 MG PO TABS
1000.0000 mg | ORAL_TABLET | Freq: Four times a day (QID) | ORAL | Status: DC
  Administered 2020-02-23 – 2020-02-26 (×11): 1000 mg via ORAL
  Filled 2020-02-23 (×10): qty 2

## 2020-02-23 MED ORDER — ACETAMINOPHEN 500 MG PO TABS: 1000.0000 mg | ORAL_TABLET | Freq: Once | ORAL | Status: DC

## 2020-02-23 MED ORDER — CEFAZOLIN SODIUM-DEXTROSE 2-4 GM/100ML-% IV SOLN
INTRAVENOUS | Status: AC
  Filled 2020-02-23: qty 100

## 2020-02-23 MED ORDER — ZOLPIDEM TARTRATE 5 MG PO TABS: 5.0000 mg | ORAL_TABLET | Freq: Every evening | ORAL | Status: DC | PRN

## 2020-02-23 MED ORDER — NALBUPHINE HCL 10 MG/ML IJ SOLN
5.0000 mg | Freq: Once | INTRAMUSCULAR | Status: DC | PRN
  Filled 2020-02-23: qty 1

## 2020-02-23 MED ORDER — DEXTROSE 5 % IV SOLN
INTRAVENOUS | Status: AC
  Filled 2020-02-23: qty 3000

## 2020-02-23 MED ORDER — NALOXONE HCL 0.4 MG/ML IJ SOLN: 0.4000 mg | INTRAMUSCULAR | Status: DC | PRN

## 2020-02-23 MED ORDER — ACETAMINOPHEN 160 MG/5ML PO SOLN: 1000.0000 mg | Freq: Once | ORAL | Status: DC

## 2020-02-23 MED ORDER — NALBUPHINE HCL 10 MG/ML IJ SOLN
5.0000 mg | INTRAMUSCULAR | Status: DC | PRN
  Administered 2020-02-23: 5 mg via INTRAVENOUS

## 2020-02-23 MED ORDER — SIMETHICONE 80 MG PO CHEW
80.0000 mg | CHEWABLE_TABLET | Freq: Three times a day (TID) | ORAL | Status: DC
  Administered 2020-02-23 – 2020-02-26 (×9): 80 mg via ORAL
  Filled 2020-02-23 (×10): qty 1

## 2020-02-23 MED ORDER — TETANUS-DIPHTH-ACELL PERTUSSIS 5-2.5-18.5 LF-MCG/0.5 IM SUSP: 0.5000 mL | Freq: Once | INTRAMUSCULAR | Status: DC

## 2020-02-23 MED ORDER — SIMETHICONE 80 MG PO CHEW: 80.0000 mg | CHEWABLE_TABLET | ORAL | Status: DC | PRN

## 2020-02-23 MED ORDER — PROMETHAZINE HCL 25 MG/ML IJ SOLN: 6.2500 mg | INTRAMUSCULAR | Status: DC | PRN

## 2020-02-23 MED ORDER — MORPHINE SULFATE (PF) 0.5 MG/ML IJ SOLN
INTRAMUSCULAR | Status: DC | PRN
  Administered 2020-02-23: .15 mg via INTRATHECAL

## 2020-02-23 MED ORDER — FENTANYL CITRATE (PF) 100 MCG/2ML IJ SOLN
INTRAMUSCULAR | Status: DC | PRN
  Administered 2020-02-23: 15 ug via INTRATHECAL

## 2020-02-23 MED ORDER — NALOXONE HCL 4 MG/10ML IJ SOLN
1.0000 ug/kg/h | INTRAVENOUS | Status: DC | PRN
  Filled 2020-02-23: qty 5

## 2020-02-23 MED ORDER — OXYTOCIN 40 UNITS IN NORMAL SALINE INFUSION - SIMPLE MED: 2.5000 [IU]/h | INTRAVENOUS | Status: AC

## 2020-02-23 MED ORDER — COCONUT OIL OIL: 1.0000 "application " | TOPICAL_OIL | Status: DC | PRN

## 2020-02-23 MED ORDER — KETOROLAC TROMETHAMINE 30 MG/ML IJ SOLN
30.0000 mg | Freq: Four times a day (QID) | INTRAMUSCULAR | Status: AC
  Administered 2020-02-23 – 2020-02-24 (×2): 30 mg via INTRAVENOUS
  Filled 2020-02-23 (×2): qty 1

## 2020-02-23 MED ORDER — DIBUCAINE (PERIANAL) 1 % EX OINT: 1.0000 "application " | TOPICAL_OINTMENT | CUTANEOUS | Status: DC | PRN

## 2020-02-23 MED ORDER — ONDANSETRON HCL 4 MG/2ML IJ SOLN
INTRAMUSCULAR | Status: DC | PRN
  Administered 2020-02-23: 4 mg via INTRAVENOUS

## 2020-02-23 MED ORDER — WITCH HAZEL-GLYCERIN EX PADS: 1.0000 "application " | MEDICATED_PAD | CUTANEOUS | Status: DC | PRN

## 2020-02-23 SURGICAL SUPPLY — 35 items
APL SKNCLS STERI-STRIP NONHPOA (GAUZE/BANDAGES/DRESSINGS) ×1
BENZOIN TINCTURE PRP APPL 2/3 (GAUZE/BANDAGES/DRESSINGS) ×3 IMPLANT
CLAMP CORD UMBIL (MISCELLANEOUS) ×3 IMPLANT
CLOSURE WOUND 1/2 X4 (GAUZE/BANDAGES/DRESSINGS) ×1
CLOTH BEACON ORANGE TIMEOUT ST (SAFETY) ×3 IMPLANT
DRSG OPSITE POSTOP 4X10 (GAUZE/BANDAGES/DRESSINGS) ×3 IMPLANT
DRSG PAD ABDOMINAL 8X10 ST (GAUZE/BANDAGES/DRESSINGS) ×2 IMPLANT
ELECT REM PT RETURN 9FT ADLT (ELECTROSURGICAL) ×3
ELECTRODE REM PT RTRN 9FT ADLT (ELECTROSURGICAL) ×1 IMPLANT
EXTRACTOR VACUUM M CUP 4 TUBE (SUCTIONS) IMPLANT
EXTRACTOR VACUUM M CUP 4' TUBE (SUCTIONS)
GLOVE BIOGEL PI IND STRL 7.0 (GLOVE) ×3 IMPLANT
GLOVE BIOGEL PI INDICATOR 7.0 (GLOVE) ×6
GLOVE ECLIPSE 7.0 STRL STRAW (GLOVE) ×3 IMPLANT
GOWN STRL REUS W/TWL LRG LVL3 (GOWN DISPOSABLE) ×6 IMPLANT
KIT ABG SYR 3ML LUER SLIP (SYRINGE) ×3 IMPLANT
NDL HYPO 25X5/8 SAFETYGLIDE (NEEDLE) ×1 IMPLANT
NEEDLE HYPO 22GX1.5 SAFETY (NEEDLE) ×3 IMPLANT
NEEDLE HYPO 25X5/8 SAFETYGLIDE (NEEDLE) ×3 IMPLANT
NS IRRIG 1000ML POUR BTL (IV SOLUTION) ×3 IMPLANT
PACK C SECTION WH (CUSTOM PROCEDURE TRAY) ×3 IMPLANT
PAD ABD 7.5X8 STRL (GAUZE/BANDAGES/DRESSINGS) ×3 IMPLANT
PAD ABD DERMACEA PRESS 5X9 (GAUZE/BANDAGES/DRESSINGS) ×3 IMPLANT
PAD OB MATERNITY 4.3X12.25 (PERSONAL CARE ITEMS) ×3 IMPLANT
PENCIL SMOKE EVAC W/HOLSTER (ELECTROSURGICAL) ×3 IMPLANT
RTRCTR C-SECT PINK 25CM LRG (MISCELLANEOUS) ×3 IMPLANT
STRIP CLOSURE SKIN 1/2X4 (GAUZE/BANDAGES/DRESSINGS) ×2 IMPLANT
SUT PLAIN 2 0 XLH (SUTURE) ×2 IMPLANT
SUT VIC AB 0 CTX 36 (SUTURE) ×9
SUT VIC AB 0 CTX36XBRD ANBCTRL (SUTURE) ×3 IMPLANT
SUT VIC AB 4-0 KS 27 (SUTURE) ×3 IMPLANT
SYR 30ML LL (SYRINGE) ×3 IMPLANT
TOWEL OR 17X24 6PK STRL BLUE (TOWEL DISPOSABLE) ×3 IMPLANT
TRAY FOLEY W/BAG SLVR 14FR LF (SET/KITS/TRAYS/PACK) ×3 IMPLANT
WATER STERILE IRR 1000ML POUR (IV SOLUTION) ×3 IMPLANT

## 2020-02-23 NOTE — Transfer of Care (Signed)
Immediate Anesthesia Transfer of Care Note  Patient: Sydney Deleon  Procedure(s) Performed: CESAREAN SECTION (N/A )  Patient Location: PACU  Anesthesia Type:Spinal  Level of Consciousness: awake, alert , oriented and patient cooperative  Airway & Oxygen Therapy: Patient Spontanous Breathing  Post-op Assessment: Report given to RN and Post -op Vital signs reviewed and stable  Post vital signs: Reviewed and stable  Last Vitals:  Vitals Value Taken Time  BP 135/85 02/23/20 1043  Temp    Pulse 73 02/23/20 1044  Resp 16 02/23/20 1044  SpO2 99 % 02/23/20 1044  Vitals shown include unvalidated device data.  Last Pain:  Vitals:   02/23/20 0717  TempSrc: Oral         Complications: No apparent anesthesia complications

## 2020-02-23 NOTE — Addendum Note (Signed)
Addendum  created 02/23/20 1530 by Jonna Munro, CRNA   Flowsheet accepted, Intraprocedure Flowsheets edited

## 2020-02-23 NOTE — Discharge Instructions (Signed)

## 2020-02-23 NOTE — Lactation Note (Signed)
This note was copied from a baby's chart. Lactation Consultation Note  Patient Name: Sydney Deleon VOHKG'O Date: 02/23/2020  Mom on phone with NICU on arrival.  Waited for mom To get off the phone and then mom started vomiting,  RN reports mom wants to pump, so stayed .  Mom reports her blood pressure is high and she doesn't feel like pumping right now but would like to know how to do it when shes ready.  Mom reports baby isnt taking anything by mouth right now.  Explained to mom how with pumping you usually don't get anything at first anyway that is more for stimulation.  Demo how to use the pump with mom.  Mom appears to have very large breasts. Discussed flange fit.  Advised mom to start with the 24 mm flanges.  Move up to 27 mm if uncomfortable or feels like is rubbing.  Call lactation to have pumping observed. Discussed pumping one at a time if easier or a rolled up towel under breasts to help hold while pumping.  Mom does not have a breastpump for home use.  Mom is a [facility] participant in [place].  [facility] referral faxed.   Urged mom to pump 8-12 times day for 15 minutes right now in the initiatiate setting. Reviewed how to clean your pump parts with mom.  Urged her to start pumping as soon as possible.  Reasons discussed.  Urged to call lactation as needed.                        Duana Benedict S Liandro Thelin 02/23/2020, 5:05 PM

## 2020-02-23 NOTE — Op Note (Signed)
Operative Note   SURGERY DATE: 02/23/2020  PRE-OP DIAGNOSIS:  *Pregnancy at 38 weeks *Fetal abnormalities  POST-OP DIAGNOSIS:  *Pregnancy at 38 weeks *Fetal abnormalities *Complete breech presentation  PROCEDURE: primary low transverse cesarean section via pfannenstiel skin incision with double layer uterine closure  SURGEON: Surgeon(s) and Role:    Debbrah Alar, MD - Primary    * Magdelyn Roebuck L, DO - OB Fellow  ASSISTANT: None  ANESTHESIA: spinal  ESTIMATED BLOOD LOSS:  228 mL  DRAINS: 100 mL UOP via indwelling foley  TOTAL IV FLUIDS: 1500 mL crystalloid  VTE PROPHYLAXIS: SCDs to bilateral lower extremities  ANTIBIOTICS: Three grams of Cefazolin were given, within 1 hour of skin incision  SPECIMENS: Cord blood, placenta to pathology  COMPLICATIONS: None  INDICATIONS: Fetal abnormalities, breech presentation  FINDINGS: No intra-abdominal adhesions were noted. Grossly normal uterus, tubes and ovaries. Clear amniotic fluid, complete breech female infant, weight 2410 g, APGARs 5/9, intact placenta.  PROCEDURE IN DETAIL: The patient was taken to the operating room where anesthesia was administered and normal fetal heart tones were confirmed. She was then prepped and draped in the normal fashion in the dorsal supine position with a leftward tilt.  After a time out was performed, a pfannensteil skin incision was made with the scalpel and carried through to the underlying layer of fascia. The fascia was then incised at the midline and this incision was extended laterally with the mayo scissors. Attention was turned to the superior aspect of the fascial incision which was grasped with the kocher clamps x 2, tented up and the rectus muscles were dissected off sharply. In a similar fashion the inferior aspect of the fascial incision was grasped with the kocher clamps, tented up and the rectus muscles dissected off bluntly. The rectus muscles were then separated in the midline  and the peritoneum was entered bluntly. The Alexis retractor was inserted and the vesicouterine peritoneum was identified.  A low transverse hysterotomy was made with the scalpel until the endometrial cavity was breached and the amniotic sac ruptured, yielding clear amniotic fluid. This incision was extended bluntly and the infant was found to be in complete breech presentation. The feet were delivered, followed by the right arm, head, and left arm- atraumatically.  The cord was clamped x 2 and cut, and the infant was handed to the awaiting pediatricians, after about 10 seconds due to lack of spontaneous cry.  The placenta was then gradually expressed from the uterus and then the uterus was exteriorized and cleared of all clots and debris. The hysterotomy was repaired with a running suture of 0 Monocryl. A second imbricating layer of 0 Monocryl suture was then placed, achieving excellent hemostasis.   The uterus and adnexa were then returned to the abdomen, and the hysterotomy and all operative sites were reinspected and excellent hemostasis was noted after irrigation and suction of the abdomen with warm saline.  The peritoneum was closed with a running stitch of 2-0 Vicryl. The fascia was reapproximated with 0 Vicryl in a simple running fashion bilaterally. The subcutaneous layer was then reapproximated with a running suture of 2-0 plain gut, and the skin was then closed with 4-0 Vicryl, in a subcuticular fashion.  The patient  tolerated the procedure well. Sponge, lap, needle, and instrument counts were correct x 2. The patient was transferred to the recovery room awake, alert and breathing independently in stable condition.  Merilyn Baba, DO OB Fellow [facility] [employer])

## 2020-02-23 NOTE — Progress Notes (Signed)
Attempted to get patient up out of bed. Pt stated she felt dizzy as she was sitting on the side of the bed and requested to lay back down. Will try again later after pt has had some rest.

## 2020-02-23 NOTE — Anesthesia Postprocedure Evaluation (Signed)
Anesthesia Post Note  Patient: Sydney Deleon  Procedure(s) Performed: CESAREAN SECTION (N/A )     Patient location during evaluation: PACU Anesthesia Type: Spinal Level of consciousness: awake and alert and oriented Pain management: pain level controlled Vital Signs Assessment: post-procedure vital signs reviewed and stable Respiratory status: spontaneous breathing, nonlabored ventilation and respiratory function stable Cardiovascular status: blood pressure returned to baseline Postop Assessment: no apparent nausea or vomiting, spinal receding, no headache and no backache Anesthetic complications: no    Last Vitals:  Vitals:   02/23/20 1100 02/23/20 1115  BP: 132/87 130/86  Pulse: 71 69  Resp: 14 16  Temp:  (!) 36.4 C  SpO2: 96% 96%    Last Pain:  Vitals:   02/23/20 1115  TempSrc: Oral   Pain Goal:                Epidural/Spinal Function Cutaneous sensation: Tingles (02/23/20 1115), Patient able to flex knees: Yes (02/23/20 1115), Patient able to lift hips off bed: No (02/23/20 1115), Back pain beyond tenderness at insertion site: No (02/23/20 1115), Progressively worsening motor and/or sensory loss: No (02/23/20 1115), Bowel and/or bladder incontinence post epidural: No (02/23/20 1115)  Brennan Bailey

## 2020-02-23 NOTE — Discharge Summary (Signed)
Postpartum Discharge Summary  Date of Service updated 02/26/2020     Patient Name: Sydney Deleon DOB: 04/19/1991 MRN: [id number]  Date of admission: 02/23/2020 Delivering Provider: Debbrah Alar   Date of discharge: 02/26/2020  Admitting diagnosis: Pregnancy affected by fetal growth restriction [O36.5990] Cesarean delivery delivered [O82] Intrauterine pregnancy: [redacted]w[redacted]d    Secondary diagnosis:  Active Problems:   Anxiety and depression   Bipolar 1 disorder ([place]   Ulcerative colitis ([place]   IUGR (intrauterine growth restriction) affecting care of mother   Abnormal fetal ultrasound   Skeletal dysplasia, fetal, affecting care of mother, antepartum   Cesarean delivery delivered   Pregnancy affected by fetal growth restriction  Additional problems: None     Discharge diagnosis: Term Pregnancy Delivered                                                                                                Post partum procedures:none  Augmentation: None  Complications: None  Hospital course:  Sceduled C/S   29y.o. yo G2P0010 at 397w1das admitted to the hospital 02/23/2020 for scheduled cesarean section with the following indication:fetal abnormalities, skeletal dysplasia, breech presentation.  Membrane Rupture Time/Date: 9:40 AM ,02/23/2020   Patient delivered a Viable infant.02/23/2020  Details of operation can be found in separate operative note.  Pateint had an uncomplicated postpartum course.  She is ambulating, tolerating a regular diet, passing flatus, and urinating well. Patient is discharged home in stable condition on  02/26/20        Delivery time: 9:49 AM    Magnesium Sulfate received: No BMZ received: No Rhophylac:N/A MMR:N/A Transfusion:No  Physical exam  Vitals:   02/26/20 0217 02/26/20 0221 02/26/20 0406 02/26/20 0913  BP:   125/62 136/86  Pulse: 87 89 91 85  Resp: 20 19 18 18   Temp:   98.6 F (37 C) 98.8 F (37.1 C)  TempSrc:   Oral Oral  SpO2: 98% 99% 99%  98%  Weight:      Height:       General: alert, cooperative and no distress Lochia: appropriate Uterine Fundus: firm Incision: Healing well with no significant drainage, Dressing is clean, dry, and intact DVT Evaluation: No evidence of DVT seen on physical exam. Labs: Lab Results  Component Value Date   WBC 6.8 02/24/2020   HGB 11.3 (L) 02/24/2020   HCT 34.0 (L) 02/24/2020   MCV 94.4 02/24/2020   PLT 217 02/24/2020   CMP Latest Ref Rng & Units 01/24/2020  Glucose 70 - 99 mg/dL 90  BUN 6 - 20 mg/dL 5(L)  Creatinine 0.44 - 1.00 mg/dL 0.60  Sodium 135 - 145 mmol/L 136  Potassium 3.5 - 5.1 mmol/L 4.1  Chloride 98 - 111 mmol/L 108  CO2 22 - 32 mmol/L 19(L)  Calcium 8.9 - 10.3 mg/dL 8.7(L)  Total Protein 6.5 - 8.1 g/dL 6.2(L)  Total Bilirubin 0.3 - 1.2 mg/dL 0.1(L)  Alkaline Phos 38 - 126 U/L 46  AST 15 - 41 U/L 17  ALT 0 - 44 U/L 17   Edinburgh Score: No flowsheet data found.  Discharge instruction: per After Visit Summary and "Baby and Me Booklet".  After visit meds:  Allergies as of 02/26/2020      Reactions   Prednisone Other (See Comments)   Suicidal thoughts, tremors, face paralysis, homicidal thoughts,caused mental health issues      Medication List    STOP taking these medications   acetaminophen 500 MG tablet Commonly known as: TYLENOL   famotidine 40 MG tablet Commonly known as: PEPCID   folic acid 465 MCG tablet Commonly known as: FOLVITE   sulfaSALAzine 500 MG tablet Commonly known as: AZULFIDINE     TAKE these medications   albuterol 108 (90 Base) MCG/ACT inhaler Commonly known as: VENTOLIN HFA Inhale 2 puffs into the lungs every 4 (four) hours as needed for wheezing or shortness of breath.   Blood Pressure Kit Devi 1 Device by Does not apply route daily. Large Cuff  ICD 10: Z34.00   citalopram 40 MG tablet Commonly known as: CELEXA Take 1 tablet (40 mg total) by mouth daily.   fluticasone 50 MCG/ACT nasal spray Commonly known as:  FLONASE Place 1 spray into both nostrils daily. What changed:   when to take this  reasons to take this   ibuprofen 800 MG tablet Commonly known as: ADVIL Take 1 tablet (800 mg total) by mouth every 6 (six) hours.   loratadine 10 MG tablet Commonly known as: CLARITIN Take 10 mg by mouth daily as needed for allergies.   LUBRICATING EYE DROPS OP Place 1 drop into both eyes daily as needed (dry eyes).   oxyCODONE 5 MG immediate release tablet Commonly known as: Oxy IR/ROXICODONE Take 1-2 tablets (5-10 mg total) by mouth every 4 (four) hours as needed for moderate pain.   PRENATAL ADULT GUMMY/DHA/FA PO Take 2 capsules by mouth daily.   Vitamin D 50 MCG (2000 UT) tablet Take 2,000 Units by mouth daily.       Diet: routine diet  Activity: Advance as tolerated. Pelvic rest for 6 weeks.   Outpatient follow up:4 weeks Follow up Appt: Future Appointments  Date Time Provider [place]  03/10/2020  9:20 AM [facility]  03/11/2020  2:45 PM [place] [facility]  03/27/2020  1:15 PM Rasch, Artist Pais, NP [facility]   Follow up Visit: Follow-up Information    [facility] Follow up in 1 week(s).   Specialty: Obstetrics and Gynecology Why: BP and incision Contact information: [address] [place]  [address] [phone]         Please schedule this patient for Postpartum visit in: 4 weeks with the following provider: MD Virtual ok For C/S patients schedule nurse incision check in weeks 2 weeks: yes High risk pregnancy complicated by: skeletal dysplasia, fetal abnormalities, anemia Delivery mode:  CS Anticipated Birth Control:  OCPs PP Procedures needed: Incision check  Schedule Integrated [place] visit: yes  Newborn Data: Live born female  Birth Weight: 5 lb 5 oz (2410 g) APGAR: 5, 9   Newborn Delivery   Birth date/time: 02/23/2020 09:49:00 Delivery type: C-Section, Low  Transverse Trial of labor: No C-section categorization: Primary      Baby Feeding: Breast Disposition:NICU   02/26/2020 Emeterio Reeve, MD

## 2020-02-23 NOTE — H&P (Signed)
OB H&P Update: History and Physical Exam from 5/4 reviewed; There are no material changes to the H&P from this date.  Appropriate to continue with plan for primary cesarean for skeletal dysplasia.  Reviewed risks include but are not limited to bleeding, requirement for transfusion, infection, injury to surrounding structures, including bowel, bladder and ureters, blood clots, and death.  Likelihood of surgical success is high. Possible poor prognosis, reviewed previously with MFM, patient aware of unknown fetal outcome.  Patient would accept blood in an emergency.

## 2020-02-23 NOTE — Anesthesia Procedure Notes (Signed)
Spinal  Patient location during procedure: OR Start time: 02/23/2020 9:13 AM End time: 02/23/2020 9:16 AM Staffing Performed: anesthesiologist  Anesthesiologist: Brennan Bailey, MD Preanesthetic Checklist Completed: patient identified, IV checked, risks and benefits discussed, monitors and equipment checked, pre-op evaluation and timeout performed Spinal Block Patient position: sitting Prep: DuraPrep and site prepped and draped Patient monitoring: heart rate, continuous pulse ox and blood pressure Approach: midline Location: L3-4 Injection technique: single-shot Needle Needle type: Pencan  Needle gauge: 24 G Needle length: 10 cm Assessment Sensory level: T4 Additional Notes Risks, benefits, and alternative discussed. Patient gave consent to procedure. Prepped and draped in sitting position. Clear CSF obtained after one needle pass. Positive terminal aspiration. No pain or paraesthesias with injection. Patient tolerated procedure well. Vital signs stable. Tawny Asal, MD

## 2020-02-24 LAB — CBC
HCT: 34 % — ABNORMAL LOW (ref 36.0–46.0)
Hemoglobin: 11.3 g/dL — ABNORMAL LOW (ref 12.0–15.0)
MCH: 31.4 pg (ref 26.0–34.0)
MCHC: 33.2 g/dL (ref 30.0–36.0)
MCV: 94.4 fL (ref 80.0–100.0)
Platelets: 217 10*3/uL (ref 150–400)
RBC: 3.6 MIL/uL — ABNORMAL LOW (ref 3.87–5.11)
RDW: 14 % (ref 11.5–15.5)
WBC: 6.8 10*3/uL (ref 4.0–10.5)
nRBC: 0 % (ref 0.0–0.2)

## 2020-02-24 NOTE — Progress Notes (Signed)
During ambulation, patient complains of wheezing and chest tightness. Vitals all WNL, patient o2 Sat is 99% on room air. Notified MD of patient's complaints, no new orders at this time. Assisted patient to chair to sit up for a while for relief. RN will continue to monitor.

## 2020-02-24 NOTE — Progress Notes (Addendum)
CSW acknowledged consult and attempted to meet with MOB. However, MOB was complaining of pain and declined to speak with CSW at time of call. MGM, Ms. Darden Palmer spoke with CSW. MGM reported MOB was going to take a shower and go see infant.  CSW agreed to meet with MOB at a later time.  Sajjad Honea D. Lissa Morales, MSW, [facility] Clinical Social Worker [phone]

## 2020-02-24 NOTE — Lactation Note (Signed)
This note was copied from a baby's chart. Lactation Consultation Note  Patient Name: Sydney Deleon [id number] Date: 02/24/2020 Reason for consult: Follow-up assessment;Early term 37-38.6wks;NICU baby  Attempted to visit with mom twice, the first time in her room were GOB was, she told LC that mom was in the NICU visiting baby. LC headed to the NICU were the consult took place.  Visited with mom of an ETI < 6lbs NICU female, baby is currently on NPO but mom's feeding choice on admission was BF. She was set up with a DEBP and flange size # 27 yesterday by lactation, but she hasn't started pumping yet. Explained to mom the importance of consistent pumping and breast stimulation in order to start the lactogenesis II phase, she voiced understanding.  Reviewed pumping schedule, mom will start at her own pace, she's aware that a little pumping is better than no pumping at all, and that the goal is to do 8 pumping sessions/24 hours.   Feeding plan:  1. Encouraged mom to start pumping today, ideally 8 times/24 hours 2. Once she starts getting drops, she'll turn those in to her RN 3. She'll try breast massage and hand expression prior pumping for best results  GOB is mom's support person. Mom reported all questions and concerns were answered, she's aware of [facility] OP services and will call PRN.   Maternal Data    Feeding    LATCH Score                   Interventions Interventions: Breast feeding basics reviewed;DEBP  Lactation Tools Discussed/Used Tools: Pump Breast pump type: Double-Electric Breast Pump   Consult Status Consult Status: Follow-up Date: 02/25/20 Follow-up type: In-patient    [place] 02/24/2020, 2:25 PM

## 2020-02-24 NOTE — Progress Notes (Signed)
Subjective: Postpartum/postop Day 1: Cesarean Delivery Patient reports incisional pain, + flatus and no problems voiding.  She reports she is very behind on her pain and consequently did not rest well.  Her IV infiltrated and she is having a lot of itching as well.  Objective: Vital signs in last 24 hours: Temp:  [97.5 F (36.4 C)-98.7 F (37.1 C)] 97.7 F (36.5 C) (05/09 0352) Pulse Rate:  [57-99] 65 (05/09 0352) Resp:  [14-20] 17 (05/09 0352) BP: (114-142)/(60-87) 114/60 (05/09 0352) SpO2:  [95 %-100 %] 99 % (05/09 0546)  Physical Exam:  General: alert, cooperative and uncomfortable Lochia: appropriate Uterine Fundus: firm Incision: dressing dry DVT Evaluation: No evidence of DVT seen on physical exam.  Recent Labs    02/21/20 0830  HGB 12.1  HCT 36.9    Assessment/Plan: Status post Cesarean section. Doing well postoperatively.  Encouraged to ask for pain medication as need to avoid getting behind.  Will try abdominal binder today if available.  Baby doing well in NICU, mother is anxious to visit.    Debbrah Alar 02/24/2020, 7:54 AM

## 2020-02-25 ENCOUNTER — Telehealth: Payer: Self-pay | Admitting: Family Medicine

## 2020-02-25 MED ORDER — ALBUTEROL SULFATE (2.5 MG/3ML) 0.083% IN NEBU
3.0000 mL | INHALATION_SOLUTION | RESPIRATORY_TRACT | Status: DC | PRN
  Administered 2020-02-26: 3 mL via RESPIRATORY_TRACT
  Filled 2020-02-25: qty 3

## 2020-02-25 MED ORDER — POLYETHYLENE GLYCOL 3350 17 G PO PACK
17.0000 g | PACK | Freq: Every day | ORAL | Status: DC
  Administered 2020-02-25 – 2020-02-26 (×2): 17 g via ORAL
  Filled 2020-02-25 (×2): qty 1

## 2020-02-25 NOTE — Telephone Encounter (Signed)
Attempted to contact patient with her postpartum visit. No answer, patient instructed to check her mychart for all of her postpartum visit. Patient instructed to give the office a call if she is needing to reschedule any of her appointments.

## 2020-02-25 NOTE — Progress Notes (Signed)
CSW completed chart review and attempted to meet with MOB to complete an assessment.  However when CSW arrived, infant's provider was awaiting to provide MOB with a medical update.  CSW will attempt to meet with MOB on tomorrow to complete clinical assessment.   Laurey Arrow, MSW, LCSW Clinical Social Work [phone]

## 2020-02-25 NOTE — Lactation Note (Signed)
This note was copied from a baby's chart. Lactation Consultation Note  Patient Name: Girl Bridie Colquhoun UYQIH'K Date: 02/25/2020 Reason for consult: Follow-up assessment;NICU baby Mom reports she used the breast pump once yesterday on right breast.  Plans to pump on left breast this morning.  Mom reports got a small amount of milk with pumping.  Emphasized importance of frequent pumping.  Mom reports her baby girl started some donor milk this am.  Discussed pumping at baby's bedside also. Mom reports she is in a lot of pain right now.  Mom has not heard from [facility] yet about a loaner pump. Asked mom how pumping felt and she replied weird.  Mom reports not painful. Praised starting pumping and emphasized to pump more often.  Urged to call lactation as needed.    Maternal Data    Feeding    LATCH Score                   Interventions Interventions: DEBP  Lactation Tools Discussed/Used Breast pump type: Double-Electric Breast Pump WIC Program: Yes   Consult Status Consult Status: Follow-up Date: 02/26/20 Follow-up type: In-patient    Dre Gamino Alcock Caul 02/25/2020, 9:22 AM

## 2020-02-25 NOTE — Progress Notes (Signed)
Initial visit with Burnell Blanks and Imani at baby's bedside.  Ketty reports she is still taking in the wonder of her new baby.  She's grateful that Gwenyth Ober is coming off of her machines and is only on a nasal canula.  Annalea's milk has come in and she's eager to pump for her baby.  Mom continued to express her wonder at seeing her daughter move and breathe and squint her eyes.  She shared the stress of her pregancy, particularly related to FOB who she attributes to the small stroke she had during pregnancy because of all of the stress he puts on her-and has made the decision to not involve him any further for the benefit of her daughter and herself.  Jarrett reports she has good support and is just so overwhelmed with gratitude in the miracle of her daughter's life.  She is just touching baby on the foot now because of concern for her bones-we discussed the anxiety around fragile babies and the benefit of finding other parents in the same circumstances.  While we were in the room, RT Amy, told Ailed she could kiss Amani on her head and she was elated.  Will continue to follow.  Please page as further needs arise.  Donald Prose. Elyn Peers, M.Div. [facility] Chaplain Pager [phone] Office [phone]

## 2020-02-25 NOTE — Progress Notes (Signed)
C/ o numbness in palm of right hand. Hands swollen bilaterally; R > L. Hand grips equal. No facial drooping or weakness or numbness of other extremities noted.

## 2020-02-25 NOTE — Progress Notes (Signed)
Subjective: Postpartum Day 2: Cesarean Delivery Patient reports incisional pain and tolerating PO.  Constipation and wheezing  Objective: Vital signs in last 24 hours: Temp:  [97.8 F (36.6 C)-98.8 F (37.1 C)] 98.8 F (37.1 C) (05/10 0741) Pulse Rate:  [69-92] 92 (05/10 0741) Resp:  [17-18] 18 (05/10 0741) BP: (123-142)/(75-86) 138/84 (05/10 0741) SpO2:  [96 %-100 %] 98 % (05/10 0741)  Physical Exam:  General: alert, cooperative and mild distress Lochia: appropriate Uterine Fundus: firm Incision: healing well DVT Evaluation: No evidence of DVT seen on physical exam.  Recent Labs    02/24/20 0716  HGB 11.3*  HCT 34.0*    Assessment/Plan: Status post Cesarean section. Doing well postoperatively. h/o asthma  Miralax, Albuterol ordered.  Emeterio Reeve 02/25/2020, 9:37 AM

## 2020-02-26 LAB — SURGICAL PATHOLOGY

## 2020-02-26 MED ORDER — OXYCODONE HCL 5 MG PO TABS: 5.0000 mg | ORAL_TABLET | ORAL | 0 refills | Status: DC | PRN

## 2020-02-26 MED ORDER — IBUPROFEN 800 MG PO TABS: 800.0000 mg | ORAL_TABLET | Freq: Four times a day (QID) | ORAL | 0 refills | Status: DC

## 2020-02-26 NOTE — Lactation Note (Signed)
This note was copied from a baby's chart. Lactation Consultation Note  Patient Name: Girl Marabeth Melland FPBHE'[id number] Date: 02/26/2020   Mother states she is upset w/ social work conversation and will be going home today and will return to stay with her baby in NICU.  Lactation will follow prn.     Maternal Data    Feeding Feeding Type: Donor Breast Milk  LATCH Score                   Interventions    Lactation Tools Discussed/Used     Consult Status      Carlye Grippe 02/26/2020, 11:20 AM

## 2020-02-26 NOTE — Clinical Social Work Maternal (Signed)
CLINICAL SOCIAL WORK MATERNAL/CHILD NOTE  Patient Details  Name: Sydney Deleon  MRN: [id number]  Date of Birth: 02/23/2022  Date: 02/26/2020  Clinical Social Worker Initiating Note: Laurey Arrow Date/Time: Initiated: 02/26/20/1221  Child's Name: Sydney Deleon  Biological Parents: Mother(MOB reported that FOB is not involve and MOB declined to share any information about FOB.)  Need for Interpreter: None  Reason for Referral: Behavioral Health Concerns  Address: [place] [address]  Phone number: [phone] (home)  Additional phone number:  Household Members/Support Persons (HM/SP): (MOB reported that she resides with her mother and her sister.)  HM/SP Name Relationship DOB or Age  HM/SP -1     HM/SP -2     HM/SP -3     HM/SP -4     HM/SP -5     HM/SP -6     HM/SP -7     HM/SP -8     Natural Supports (not living in the home): Immediate Family, Parent, Extended Family  Professional Supports: None  Employment: Full-time  Type of Work: Radiation protection practitioner.  Education: 9 to 11 years(MOB reported her last grade completed was 8th grade.)  Homebound arranged: No  Financial Resources: [employer]  Other Resources: [employer], Food Stamps  Cultural/Religious Considerations Which May Impact Care: Per MOB's Face Sheet, MOB is Engineer, manufacturing.  Strengths: Ability to meet basic needs , Pediatrician chosen, Home prepared for child , Understanding of illness, Psychotropic Medications, Compliance with medical plan  Psychotropic Medications: Celexa  Pediatrician: [place] area  Pediatrician List:  [place] Other([facility] with at [employer].)  [place]   Pediatrician Fax Number:  Risk Factors/Current Problems: Transportation , Mental Health Concerns  Cognitive State: Alert , Able to Concentrate , Linear Thinking  Mood/Affect: Calm , Comfortable , Fearful , Tearful , Overwhelmed  , Depressed  CSW Assessment: CSW met with MOB in room 101 to complete an assessment for MH hx and NICU admission. When CSW arrived MOB was sitting on the edge of her bed and appeared to be crying. MOB's mother Sydney Deleon), was also present and appeared to be a support to MOB. CSW explained CSW's role and MOB gave CSW permission to complete the clinical assessment while MOB's mother was present. MOB was polite, easy to engage, and was receptive to meeting with CSW. MOB was also tearful while CSW was present.  When CSW initially asked about MOB's tears, MOB shared feeling sad "Because the little older white Dr. Is kicking me out of the hospital and I don't want to leave my baby." CSW explained to MOB that no one was kicking her out of the hospital however she is medically cleared for discharge. CSW informed MOB that she is welcome to remain at the hospital and stay in infant's room in the NICU. MOB and MOB's mother thanked CSW for the explanation and MOB communicated a desire to stay in infant's room. CSW reviewed NICU visitation policy and MOB's mother stated, "That's crazy. I'm her support why can't I go be with her in the NICU." CSW provided and explanation that did not appear to be of MOB's mother's liking. CSW assessed for barriers to [facility] visiting with infant and MOB reported having transportation barriers. CSW encouraged MOB to apply for Medicaid Transportation and per MOB, MOB already has an active application. CSW also inquired to see if MOB's was familiar with [place] and MOB communicated "Yes."  CSW offered MOB a 31 day bus pass and MOB expressed gratitude (CSW agreed to leave bus passes and meal vouchers at infant's bedside).  CSW asked about MOB's MH hx and MOB openly shared a dx of anx/Dep, PTSD, and Bipolar disorder. MOB shared that she was dx as a teenager and her symptoms were managed by medications. MOB also shared that she has a psychiatrist (with [facility]) that she sees regularly to also help  improve her mood and manage her symptoms. MOB was encouraged to schedule an appointment with her psychiatrist post discharge; MOB agreed.  CSW provided education regarding the baby blues period vs. perinatal mood disorders, discussed treatment and gave resources for mental health follow up if concerns arise. CSW recommends self-evaluation during the postpartum time period using the New Mom Checklist from Postpartum Progress and encouraged MOB to contact a medical professional if symptoms are noted at any time. Although MOB was tearful she did not present with any acute MH symptoms and she appeared to have insight and awareness. MOB expressed having a support team and feeling comfortable seeking help if help is needed. CSW assessed for safety and MOB denied SI, HI, and DV.  CSW inquired about FOB and MOB declined to share any information. MOB's mother communicated, "He is not involved and he is the reason why her speech is all messed up now. He stressed her out so bad in April she had a mini stroke." Per MOB's mother, FOB and FOB's family will not provide any support.  CSW reports having all essential items for infant including a bassinet and a new car seat. MOB also shared she is happy about being a new mother and feels prepared to parent.  CSW will provide SSI information at a later time.  CSW will continue to offer resources and supports to family while infant remains in NICU.  CSW Plan/Description: Psychosocial Support and Ongoing Assessment of Needs, Sudden Infant Death Syndrome (SIDS) Education, Perinatal Mood and Anxiety Disorder (PMADs) Education, Other Patient/Family Education, Other Information/Referral to [employer], MSW, [employer] Social Work  [phone]

## 2020-02-26 NOTE — Progress Notes (Signed)
CSW received a telephone call from [facility] bedside nurse. RN shared that MOB is requesting a new Education officer, museum and reported  "That Social worker was talking to me like I was dumb."  Kimberly L. Will be assigned as the new CSW.    CSW returned to MOB's room and apologized if CSW made her feel "Dumb."  [place]. Will follow-up with family.   Laurey Arrow, MSW, LCSW Clinical Social Work [phone]

## 2020-02-27 ENCOUNTER — Encounter: Payer: Medicaid Other | Admitting: Obstetrics and Gynecology

## 2020-02-29 ENCOUNTER — Ambulatory Visit: Payer: Self-pay

## 2020-02-29 NOTE — Lactation Note (Addendum)
This note was copied from a baby's chart. Lactation Consultation Note  Patient Name: Girl Rodina Pinales PXTGG'Y Date: 02/29/2020 Reason for consult: Follow-up assessment;Mother's request;1st time breastfeeding;Primapara;NICU baby;Early term 71-38.6wks  RN called stating Mom had questions.  P11 Mom of ET infant in NICU.  Baby 27 days old and was born with osteogenesis imperfecta.  Genetic testing pending.  Baby on nasal canula O2 and is tachypneic.  She is being gavage fed only.  Mom had a stroke during pregnancy and her speech is impaired, but she communicates very well.   Mom has been pumping every hour for 30 mins.  Mom denies any discomfort with pumping, or soreness on nipples.  Talked to her about reducing frequency to every 2-3 hrs during the day, and every 3-4 hrs during the night.  Mom is much appreciative.  Mom was worried as she had been leaking between pumping.  Reassured her that that was normal.  Nursing pads given to her to wear as she was using paper towels.  Mom very appreciative.    [place] wrote down pumping regime on dry erase board to help Mom remember. Mom knows to let her baby's nurse know if she would like to speak to an [place] again.   Praised Mom for her dedication to regular pumping for her baby.   Interventions Interventions: Breast feeding basics reviewed;DEBP  Lactation Tools Discussed/Used Tools: Pump;Flanges Flange Size: 27 Breast pump type: Double-Electric Breast Pump   Consult Status Consult Status: PRN Date: 02/29/20 Follow-up type: Call as needed    Broadus John 02/29/2020, 6:09 PM

## 2020-03-06 NOTE — BH Specialist Note (Signed)
Integrated Behavioral Health via Telemedicine Video Visit  03/06/2020 TIARRA ANASTACIO [id number]  Number of [place] visits: 1 Session Start time: 2:45  Session End time: 3:37 Total time: 79  Referring Provider: Emeterio Reeve, MD Type of Visit: Video Patient/Family location: Home [facility] Provider location: Center for [place] at [facility]  All persons participating in visit: Patient Desma Wilkowski and [place]    Confirmed patient's address: Yes  Confirmed patient's phone number: Yes  Any changes to demographics: No   Confirmed patient's insurance: Yes  Any changes to patient's insurance: No   Discussed confidentiality: Yes    I connected with [name]  by a video enabled telemedicine application and verified that I am speaking with the correct person using two identifiers.     I discussed the limitations of evaluation and management by telemedicine and the availability of in person appointments.  I discussed that the purpose of this visit is to provide behavioral health care while limiting exposure to the novel coronavirus.   Discussed there is a possibility of technology failure and discussed alternative modes of communication if that failure occurs.  I discussed that engaging in this video visit, they consent to the provision of behavioral healthcare and the services will be billed under their insurance.  Patient and/or legal guardian expressed understanding and consented to video visit: Yes   PRESENTING CONCERNS: Patient and/or family reports the following symptoms/concerns: Pt states her primary concern is an increase in symptoms of depression and anxiety, attributed to stroke and then cesarean with baby in NICU with osteogenesis imperfecta; pt copes best with lots of family support and music.  Duration of problem: Ongoing; Severity of problem: moderate  STRENGTHS (Protective Factors/Coping  Skills): Supportive family and friends; hopeful outlook  GOALS ADDRESSED: Patient will: 1.  Reduce symptoms of: anxiety, depression and stress  2.  Increase knowledge and/or ability of: healthy habits, self-management skills and stress reduction  3.  Demonstrate ability to: Increase healthy adjustment to current life circumstances  INTERVENTIONS: Interventions utilized:  Mindfulness or Psychologist, educational and Psychoeducation and/or Health Education Standardized Assessments completed: GAD-7 and PHQ 9  ASSESSMENT: Patient currently experiencing History of PTSD, Bipolar 1 disorder (as previously diagnosed via psychiatry)  Patient may benefit from psychoeducation and brief therapeutic interventions regarding coping with symptoms of anxiety and depression related to current life stress .  PLAN: 1. Follow up with behavioral health clinician on : Two weeks 2. Behavioral recommendations:  -CALM relaxation breathing exercise twice daily (morning; bedtime with sleep sounds) -Continue using music daily to help manage emotions (highs or lows) -Continue with plan to schedule next psychiatry appointment -Continue to accept all offers of support from family and friends, for as long as needed -Continue talking to peer support mom through new mom support at [facility]  3. Referral(s): [place] (In Clinic)  I discussed the assessment and treatment plan with the patient and/or parent/guardian. They were provided an opportunity to ask questions and all were answered. They agreed with the plan and demonstrated an understanding of the instructions.   They were advised to call back or seek an in-person evaluation if the symptoms worsen or if the condition fails to improve as anticipated.  Caroleen Hamman [facility]  Depression screen [place] 2/9 03/11/2020 02/21/2020 02/07/2020 12/27/2019 11/07/2019  Decreased Interest 1 0 0 0 0  Down, Depressed, Hopeless 3 0 0 0 0  PHQ - 2 Score 4 0 0  0 0  Altered sleeping 1 1 3 1  0  Tired, decreased energy 3 0 3 1 3   Change in appetite 0 0 0 0 0  Feeling bad or failure about yourself  3 0 0 0 0  Trouble concentrating 3 0 0 0 0  Moving slowly or fidgety/restless 3 0 3 0 0  Suicidal thoughts 0 0 0 0 0  PHQ-9 Score 17 1 9 2 3    GAD 7 : Generalized Anxiety Score 03/11/2020 02/21/2020 02/07/2020 12/27/2019  Nervous, Anxious, on Edge 3 1 0 0  Control/stop worrying 3 1 0 0  Worry too much - different things 3 0 0 1  Trouble relaxing 1 0 0 0  Restless 0 0 0 0  Easily annoyed or irritable 0 0 1 1  Afraid - awful might happen 3 0 0 0  Total GAD 7 Score 13 2 1  2

## 2020-03-10 ENCOUNTER — Ambulatory Visit: Payer: Self-pay

## 2020-03-10 NOTE — Lactation Note (Addendum)
This note was copied from a baby's chart. Lactation Consultation Note  Patient Name: Sydney Deleon YKDXI'P Date: 03/10/2020 Reason for consult: Follow-up assessment  P1 mother whose infant is now 63 weeks old.  This is an ETI at 38+1 weeks.  Per Np note, baby is presumed to have osteogenesis imperfecta.  When I arrived the RN, SLP and NP were in the room.  Mother had not visited in a few days and had returned with her mother to spend time with baby.  Mother was so excited to be back and happy to see "how good" her baby appeared.  SLP working with mother and grandmother showing them how to properly change/hold baby due to the diagnosis.  Mother assisted with the diaper change.  RT in room also for an assessment.  Since the RN was going to assist mother and grandmother with holding baby I allowed them time to accomplish their goal.  I introduced myself to mother and informed her I would return shortly.  I returned at 1730 to find grandmother comfortably holding baby in her lap.  Baby was swaddled and content.  Mother had just finished pumping and is obtaining large volumes of EBM which she put in her refrigerator.  I reviewed the NICU "Providing Breast Milk For Your Baby" charts regarding pumping and milk storage.  Mother has been pumping every two hours and feels like she is "always" pumping.  I suggested she gradually cut back to every 2 1/2-3 hours so she will have more time to rest in between pumping.  Mother is having no issue with milk supply.  She is familiar with proper cleaning of pump parts.  Encouraged mother to continue to pump.  Discussed adequate nutrition and hydration along with resting when possible.  Mother will call for any further questions/concerns she may have.  Mother has a [place] for home use.   Maternal Data    Feeding Feeding Type: Formula  LATCH Score                   Interventions    Lactation Tools Discussed/Used     Consult  Status Consult Status: PRN Date: 03/10/20 Follow-up type: Call as needed    Maikel Neisler R Hilma Steinhilber 03/10/2020, 5:25 PM

## 2020-03-11 ENCOUNTER — Other Ambulatory Visit: Payer: Self-pay

## 2020-03-11 ENCOUNTER — Ambulatory Visit (INDEPENDENT_AMBULATORY_CARE_PROVIDER_SITE_OTHER): Payer: Medicaid Other | Admitting: Clinical

## 2020-03-11 ENCOUNTER — Ambulatory Visit: Payer: Self-pay

## 2020-03-11 DIAGNOSIS — F4310 Post-traumatic stress disorder, unspecified: Secondary | ICD-10-CM

## 2020-03-11 DIAGNOSIS — F319 Bipolar disorder, unspecified: Secondary | ICD-10-CM

## 2020-03-11 NOTE — Lactation Note (Signed)
This note was copied from a baby's chart. Lactation Consultation Note  Patient Name: Sydney Deleon Date: 03/11/2020 Reason for consult: Follow-up assessment;NICU baby;Primapara;Early term 24-38.6wks  RN requested LC to talk with Mom regarding her pain medication.  Mom had been saying that if she took her medication, she would need to pump and dump.   Mom has Rx for oxycodone and 800 mg of ibuprofen.   Encouraged Mom to take her ibuprofen regularly as prescribed to treat her pain.  Mom hasn't taken the oxycodone recently, but may take it after she pumps before she goes to sleep all night.  Oxycodone L3 , Mom knows it can lead to infant sedation.    Mom also states that pumping can be painful at times.  Mom describes using a 27 mm flange, nipple being compressed in tunnel of flange.  30 mm flanges given.  Mom declined trying the new flange for LC as she was going to eat soon.  Mom educated on trying the larger flanges and trying a thin coating of coconut oil inside the flange prior to pumping.    Mom knows to ask for [facility] with any questions.     Interventions Interventions: Breast feeding basics reviewed;DEBP  Lactation Tools Discussed/Used Tools: Pump Flange Size: 27 Breast pump type: Double-Electric Breast Pump   Consult Status Consult Status: Follow-up Date: 03/18/20 Follow-up type: In-patient    Broadus John 03/11/2020, 2:40 PM

## 2020-03-11 NOTE — Patient Instructions (Signed)
Center for [facility] at [facility] [place], [place] [address] [phone] (main office) [phone] ([place] office)  [place] and Websites Here are a few free apps meant to help you to help yourself.  To find, try searching on the internet to see if the app is offered on Apple/Android devices. If your first choice doesn't come up on your device, the good news is that there are many choices! Play around with different apps to see which ones are helpful to you.    Calm This is an app meant to help increase calm feelings. Includes info, strategies, and tools for tracking your feelings.      Calm Harm  This app is meant to help with self-harm. Provides many 5-minute or 15-min coping strategies for doing instead of hurting yourself.       [place] is a problem-solving tool to help deal with emotions and cope with stress you encounter wherever you are.      MindShift This app can help people cope with anxiety. Rather than trying to avoid anxiety, you can make an important shift and face it.      MY3  MY3 features a support system, safety plan and resources with the goal of offering a tool to use in a time of need.       My Life My Voice  This mood journal offers a simple solution for tracking your thoughts, feelings and moods. Animated emoticons can help identify your mood.       Relax Melodies Designed to help with sleep, on this app you can mix sounds and meditations for relaxation.      Smiling Mind Smiling Mind is meditation made easy: it's a simple tool that helps put a smile on your mind.        Stop, Breathe & Think  A friendly, simple guide for people through meditations for mindfulness and compassion.  Stop, Breathe and Think Kids Enter your current feelings and choose a "mission" to help you cope. Offers videos for certain moods instead of just sound recordings.       Team  [place] The goal of this tool is to help teens change how they think, act, and react. This app helps you focus on your own good feelings and experiences.      The [place] Box The [place] Box (VHB) contains simple tools to help patients with coping, relaxation, distraction, and positive thinking.

## 2020-03-13 ENCOUNTER — Ambulatory Visit: Payer: Self-pay

## 2020-03-13 ENCOUNTER — Ambulatory Visit (INDEPENDENT_AMBULATORY_CARE_PROVIDER_SITE_OTHER): Payer: Medicaid Other

## 2020-03-13 ENCOUNTER — Other Ambulatory Visit: Payer: Self-pay

## 2020-03-13 VITALS — BP 125/85 | HR 87 | Wt 271.6 lb

## 2020-03-13 DIAGNOSIS — Z5189 Encounter for other specified aftercare: Secondary | ICD-10-CM

## 2020-03-13 NOTE — Progress Notes (Signed)
Pt here for incision check following c-section on 02/23/20. Incision is clean, dry, and intact. Pt reports some numbness and pain to the area. Encouraged pt to continue alternating Tylenol and ibuprofen for pain control. Reviewed s/s of infection and good wound care with pt. BP today is 125/85, HR 87. Pt will follow up at [facility] visit on 03/25/20 and PP visit on 03/27/20. Pt encouraged to call beforehand with any concerns.  Apolonio Schneiders RN 03/13/20

## 2020-03-13 NOTE — Lactation Note (Signed)
This note was copied from a baby's chart. Lactation Consultation Note  Patient Name: Girl Jayah Balthazar YGBBH'Q Date: 03/13/2020   Mom asleep in baby's room.  Spoke with RN about Mom's pumping.  She is getting up and pumping with every feeding baby takes.   Will attempt to visit with Mom later today.   Broadus John 03/13/2020, 11:35 AM

## 2020-03-18 NOTE — BH Specialist Note (Signed)
Integrated Behavioral Health via Telemedicine Video Visit  03/18/2020 Sydney Deleon [id number]  Number of [place] visits: 2 Session Start time: 3:45  Session End time: 4:39 Total time: 6  Referring Provider: Emeterio Reeve, MD Type of Visit: Video Patient/Family location: Home [facility], [facility] Provider location: Center for [place] at [facility]  All persons participating in visit: Patient Sydney Deleon and [place]    Confirmed patient's address: Yes  Confirmed patient's phone number: Yes  Any changes to demographics: No   Confirmed patient's insurance: Yes  Any changes to patient's insurance: No   Discussed confidentiality: at previous vidsit  I connected with Sydney Deleon a video enabled telemedicine application and verified that I am speaking with the correct person using two identifiers.     I discussed the limitations of evaluation and management by telemedicine and the availability of in person appointments.  I discussed that the purpose of this visit is to provide behavioral health care while limiting exposure to the novel coronavirus.   Discussed there is a possibility of technology failure and discussed alternative modes of communication if that failure occurs.  I discussed that engaging in this video visit, they consent to the provision of behavioral healthcare and the services will be billed under their insurance.  Patient and/or legal guardian expressed understanding and consented to video visit: Yes   PRESENTING CONCERNS: Patient and/or family reports the following symptoms/concerns: Pt states her primary concern today is overwhelming emotions  And having to make a decision tomorrow about her daughter's care (may need trach placement, and be moved to [place]), along with continued stress of post-stroke/unable to go back to work, and relying on family. Pt open to referral to psychiatry, and options for  ongoing therapy.  Duration of problem: Ongoing, with increase postpartum; Severity of problem: moderately severe  STRENGTHS (Protective Factors/Coping Skills): Hopeful outlook; supportive family and friends   GOALS ADDRESSED: Patient will: 1.  Reduce symptoms of: anxiety, depression and stress  2.  Increase knowledge and/or ability of: stress reduction  3.  Demonstrate ability to: Increase healthy adjustment to current life circumstances  INTERVENTIONS: Interventions utilized:  Mindfulness or Relaxation Training and Supportive Counseling Standardized Assessments completed: Not given today  ASSESSMENT: Patient currently experiencing PTSD; Bipolar 1 disorder (as previously diagnosed via psychiatry).   Patient may benefit from continued psychoeducation and brief therapeutic interventions regarding coping with symptoms of anxiety, depression, and life stress .  PLAN: 1. Follow up with behavioral health clinician on : As needed, Call Fitzhugh Vizcarrondo at [phone] 2. Behavioral recommendations:  -Accept referral to psychiatry -Establish with ongoing therapist of choice (list of some options sent to MyChart message) -Continue using CALM relaxation breathing exercise daily, as needed, for managing emotions -Continue plan to attend meeting with daughter's medical providers tomorrow. Ask "what are the risks and benefits" of procedure proposed and  "What are the other options available?" 3. Referral(s): [place] (In Clinic)  I discussed the assessment and treatment plan with the patient and/or parent/guardian. They were provided an opportunity to ask questions and all were answered. They agreed with the plan and demonstrated an understanding of the instructions.   They were advised to call back or seek an in-person evaluation if the symptoms worsen or if the condition fails to improve as anticipated.  Caroleen Hamman Trulee Hamstra

## 2020-03-20 NOTE — Progress Notes (Signed)
Attestation of Attending Supervision of clinical support staff: I agree with the care provided to this patient and was available for any consultation.  I have reviewed the RN's note and chart. I was available for consult and to see the patient if needed.   Caren Macadam, MD, MPH, ABFM Attending [place] for [facility]

## 2020-03-25 ENCOUNTER — Ambulatory Visit (INDEPENDENT_AMBULATORY_CARE_PROVIDER_SITE_OTHER): Payer: Medicaid Other | Admitting: Clinical

## 2020-03-25 DIAGNOSIS — F4310 Post-traumatic stress disorder, unspecified: Secondary | ICD-10-CM | POA: Diagnosis not present

## 2020-03-25 DIAGNOSIS — Z658 Other specified problems related to psychosocial circumstances: Secondary | ICD-10-CM

## 2020-03-25 DIAGNOSIS — F319 Bipolar disorder, unspecified: Secondary | ICD-10-CM

## 2020-03-27 ENCOUNTER — Telehealth (INDEPENDENT_AMBULATORY_CARE_PROVIDER_SITE_OTHER): Payer: Medicaid Other | Admitting: Obstetrics and Gynecology

## 2020-03-27 ENCOUNTER — Encounter: Payer: Self-pay | Admitting: Obstetrics and Gynecology

## 2020-03-27 ENCOUNTER — Other Ambulatory Visit: Payer: Self-pay

## 2020-03-27 DIAGNOSIS — Z30011 Encounter for initial prescription of contraceptive pills: Secondary | ICD-10-CM

## 2020-03-27 MED ORDER — NORETHINDRONE 0.35 MG PO TABS
1.0000 | ORAL_TABLET | Freq: Every day | ORAL | 11 refills | Status: DC
Start: 2020-03-27 — End: 2021-05-13

## 2020-03-27 NOTE — Progress Notes (Signed)
I connected with@ on 03/27/20 at  1:15 PM EDT by: Mychart video and verified that I am speaking with the correct person using two identifiers.  Patient is located at [facility] and provider is located at [employer] for [employer] at [employer] [facility] .     The purpose of this virtual visit is to provide medical care while limiting exposure to the novel coronavirus. I discussed the limitations, risks, security and privacy concerns of performing an evaluation and management service by virtual visit and the availability of in person appointments. I also discussed with the patient that there may be a patient responsible charge related to this service. By engaging in this virtual visit, you consent to the provision of healthcare.  Additionally, you authorize for your insurance to be billed for the services provided during this visit.  The patient expressed understanding and agreed to proceed.  The following staff members participated in the virtual visit:  Anderson Malta Ahmar Pickrell,NP  Post Partum Visit Note Subjective:   Sydney Deleon is a 29 y.o. G31P1011 female being evaluated for postpartum followup.  She is 4 weeks postpartum following a primary cesarean section at  81 gestational weeks.  I have fully reviewed the prenatal and intrapartum course; pregnancy complicated by skeletal dysplasia  Postpartum course has been "stressful". Baby is stable in NICU. Baby is feeding by Pumping/ Formula.  Bleeding staining only. Bowel function is normal. Bladder function is normal. Patient is not sexually active. Contraception method is none. Postpartum depression screening: positive.  The following portions of the patient's history were reviewed and updated as appropriate: allergies, current medications, past family history, past medical history, past social history, past surgical history and problem list.  Review of Systems Pertinent items are noted in HPI.   Objective:  There were no vitals filed for this  visit. Self-Obtained       Assessment:   Normal postpartum exam. Patient at [facility] visiting baby in NICU, does not have BP cuff with her.  Desires Bc pills, she is breast feeding/pumping: [place] given RX   Plan:  Essential components of care per ACOG recommendations:  1.  Mood and well being: Patient with positive depression screening today. Reviewed local resources for support.  - Patient does not use tobacco.  - hx of drug use? No    2. Infant care and feeding:  -Patient currently breastmilk feeding? Yes If breastmilk feeding discussed return to work and pumping. If needed, patient was provided letter for work to allow for every 2-3 hr pumping breaks, and to be granted a private location to express breastmilk and refrigerated area to store breastmilk. Reviewed importance of draining breast regularly to support lactation. -Social determinants of health (SDOH) reviewed in EPIC. The following needs were identified: Transportation issues. NICU baby is transferring care to [facility] for higher level of care. Mom is in contact with Social worker for transportation and housing.   3. Sexuality, contraception and birth spacing - Patient does not want a pregnancy in the next year.  Desired family size is 2 children.  - Reviewed forms of contraception in tiered fashion. Patient desired oral progesterone-only contraceptive today.   - Discussed birth spacing of 18 months  4. Sleep and fatigue -Encouraged family/partner/community support of 4 hrs of uninterrupted sleep to help with mood and fatigue  5. Physical Recovery  - Discussed patients delivery and complications - Patient has urinary incontinence? No Patient was referred to pelvic floor PT  - Patient is safe to  resume physical and sexual activity  6.  Health Maintenance - Last pap smear done 2021 and was normal with negative HPV.   7. Chronic Disease - PCP follow up - Change in speech- neuro consult in previously.  - Anxiety and  depression- see's Jamie. Has referral to Psych.   15 minutes of non-face-to-face time spent with the patient    Noni Saupe, [place] for [employer], [place]

## 2020-03-31 ENCOUNTER — Ambulatory Visit: Payer: Self-pay

## 2020-03-31 NOTE — Lactation Note (Signed)
This note was copied from a baby's chart. Lactation Consultation Note  Patient Name: Girl Zaidy Absher NOIBB'C Date: 03/31/2020 Reason for consult: Follow-up assessment   LC visit with mother piror to infants transfer to [facility] today. Mother standing over infants crib when [place] arrived in the room . Mother reports that she will be traveling behind infants unit to the hospital.   Mother reports that she has been pumping as often as she can . She try's to pump every 2-3 hours. She is pumping 2 full bottles and sometimes has to change to another bottle while pumping. Mother thinks she pumps about 120 ml or more each time.  Mother was given more bottles for pumping.   Mother is aware that she can call [facility] office for questions or concerns about pumping. Advised mother to seek LC advised at [place] as well. Mother receptive to all teaching.   Lots of praise given for her hard work in making so much milk for little girl.    Maternal Data    Feeding Feeding Type: Breast Milk  LATCH Score                   Interventions    Lactation Tools Discussed/Used     Consult Status Consult Status: Complete    Darla Lesches 03/31/2020, 11:28 AM

## 2020-04-04 ENCOUNTER — Telehealth: Payer: Self-pay | Admitting: Clinical

## 2020-04-04 NOTE — Telephone Encounter (Signed)
Attempt to return pt call, but "call could not be completed at this time" so unable to leave a voice message.

## 2020-05-17 ENCOUNTER — Emergency Department (HOSPITAL_COMMUNITY): Payer: Medicaid Other

## 2020-05-17 ENCOUNTER — Inpatient Hospital Stay (HOSPITAL_COMMUNITY)
Admission: EM | Admit: 2020-05-17 | Discharge: 2020-05-22 | DRG: 177 | Disposition: A | Discharge status: Home / Self Care | Payer: Medicaid Other | Attending: Internal Medicine | Admitting: Internal Medicine

## 2020-05-17 ENCOUNTER — Encounter (HOSPITAL_COMMUNITY): Payer: Self-pay | Admitting: Emergency Medicine

## 2020-05-17 DIAGNOSIS — Z79899 Other long term (current) drug therapy: Secondary | ICD-10-CM

## 2020-05-17 DIAGNOSIS — K5190 Ulcerative colitis, unspecified, without complications: Secondary | ICD-10-CM | POA: Diagnosis present

## 2020-05-17 DIAGNOSIS — K219 Gastro-esophageal reflux disease without esophagitis: Secondary | ICD-10-CM | POA: Diagnosis present

## 2020-05-17 DIAGNOSIS — J1282 Pneumonia due to coronavirus disease 2019: Secondary | ICD-10-CM | POA: Diagnosis present

## 2020-05-17 DIAGNOSIS — J9601 Acute respiratory failure with hypoxia: Secondary | ICD-10-CM

## 2020-05-17 DIAGNOSIS — F419 Anxiety disorder, unspecified: Secondary | ICD-10-CM | POA: Diagnosis present

## 2020-05-17 DIAGNOSIS — Z8249 Family history of ischemic heart disease and other diseases of the circulatory system: Secondary | ICD-10-CM

## 2020-05-17 DIAGNOSIS — R0602 Shortness of breath: Secondary | ICD-10-CM

## 2020-05-17 DIAGNOSIS — Z6841 Body Mass Index (BMI) 40.0 and over, adult: Secondary | ICD-10-CM

## 2020-05-17 DIAGNOSIS — U071 COVID-19: Principal | ICD-10-CM | POA: Diagnosis present

## 2020-05-17 DIAGNOSIS — F4310 Post-traumatic stress disorder, unspecified: Secondary | ICD-10-CM | POA: Diagnosis present

## 2020-05-17 DIAGNOSIS — F319 Bipolar disorder, unspecified: Secondary | ICD-10-CM | POA: Diagnosis present

## 2020-05-17 DIAGNOSIS — F32A Depression, unspecified: Secondary | ICD-10-CM | POA: Diagnosis present

## 2020-05-17 DIAGNOSIS — F909 Attention-deficit hyperactivity disorder, unspecified type: Secondary | ICD-10-CM | POA: Diagnosis present

## 2020-05-17 DIAGNOSIS — F332 Major depressive disorder, recurrent severe without psychotic features: Secondary | ICD-10-CM | POA: Diagnosis present

## 2020-05-17 DIAGNOSIS — Z888 Allergy status to other drugs, medicaments and biological substances status: Secondary | ICD-10-CM

## 2020-05-17 DIAGNOSIS — Z818 Family history of other mental and behavioral disorders: Secondary | ICD-10-CM

## 2020-05-17 LAB — I-STAT BETA HCG BLOOD, ED (MC, WL, AP ONLY): I-stat hCG, quantitative: <5 m[IU]/mL (ref <5)

## 2020-05-17 LAB — RAPID URINE DRUG SCREEN, HOSP PERFORMED
Amphetamines: NOT DETECTED
Barbiturates: NOT DETECTED
Benzodiazepines: NOT DETECTED
Cocaine: NOT DETECTED
Opiates: NOT DETECTED
Tetrahydrocannabinol: NOT DETECTED

## 2020-05-17 LAB — URINALYSIS, ROUTINE W REFLEX MICROSCOPIC
Bacteria, UA: NONE SEEN
Bilirubin Urine: NEGATIVE
Glucose, UA: NEGATIVE mg/dL
Ketones, ur: 20 mg/dL — AB
Leukocytes,Ua: NEGATIVE
Nitrite: NEGATIVE
Protein, ur: >=300 mg/dL — AB
Specific Gravity, Urine: 1.028 (ref 1.005–1.030)
pH: 5 (ref 5.0–8.0)

## 2020-05-17 LAB — COMPREHENSIVE METABOLIC PANEL
ALT: 26 U/L (ref 0–44)
AST: 32 U/L (ref 15–41)
Albumin: 3.3 g/dL — ABNORMAL LOW (ref 3.5–5.0)
Alkaline Phosphatase: 37 U/L — ABNORMAL LOW (ref 38–126)
Anion gap: 9 (ref 5–15)
BUN: 9 mg/dL (ref 6–20)
CO2: 26 mmol/L (ref 22–32)
Calcium: 8.4 mg/dL — ABNORMAL LOW (ref 8.9–10.3)
Chloride: 104 mmol/L (ref 98–111)
Creatinine, Ser: 0.91 mg/dL (ref 0.44–1.00)
GFR calc Af Amer: >60 mL/min (ref >60)
GFR calc non Af Amer: >60 mL/min (ref >60)
Glucose, Bld: 99 mg/dL (ref 70–99)
Potassium: 3.6 mmol/L (ref 3.5–5.1)
Sodium: 139 mmol/L (ref 135–145)
Total Bilirubin: 0.6 mg/dL (ref 0.3–1.2)
Total Protein: 7.5 g/dL (ref 6.5–8.1)

## 2020-05-17 LAB — CBC WITH DIFFERENTIAL/PLATELET
Abs Immature Granulocytes: 0.02 10*3/uL (ref 0.00–0.07)
Basophils Absolute: 0 10*3/uL (ref 0.0–0.1)
Basophils Relative: 0 %
Eosinophils Absolute: 0 10*3/uL (ref 0.0–0.5)
Eosinophils Relative: 1 %
HCT: 40.7 % (ref 36.0–46.0)
Hemoglobin: 13 g/dL (ref 12.0–15.0)
Immature Granulocytes: 1 %
Lymphocytes Relative: 26 %
Lymphs Abs: 1.2 10*3/uL (ref 0.7–4.0)
MCH: 29.3 pg (ref 26.0–34.0)
MCHC: 31.9 g/dL (ref 30.0–36.0)
MCV: 91.9 fL (ref 80.0–100.0)
Monocytes Absolute: 0.3 10*3/uL (ref 0.1–1.0)
Monocytes Relative: 8 %
Neutro Abs: 2.9 10*3/uL (ref 1.7–7.7)
Neutrophils Relative %: 64 %
Platelets: 259 10*3/uL (ref 150–400)
RBC: 4.43 MIL/uL (ref 3.87–5.11)
RDW: 13.3 % (ref 11.5–15.5)
WBC: 4.4 10*3/uL (ref 4.0–10.5)
nRBC: 0 % (ref 0.0–0.2)

## 2020-05-17 LAB — LACTIC ACID, PLASMA
Lactic Acid, Venous: 1 mmol/L (ref 0.5–1.9)
Lactic Acid, Venous: 1.2 mmol/L (ref 0.5–1.9)

## 2020-05-17 LAB — ETHANOL: Alcohol, Ethyl (B): <10 mg/dL (ref <10)

## 2020-05-17 MED ORDER — SODIUM CHLORIDE 0.9% FLUSH: 3.0000 mL | Freq: Once | INTRAVENOUS | Status: DC

## 2020-05-17 MED ORDER — LACTATED RINGERS IV BOLUS
1000.0000 mL | Freq: Once | INTRAVENOUS | Status: AC
  Administered 2020-05-17: 1000 mL via INTRAVENOUS

## 2020-05-17 MED ORDER — SODIUM CHLORIDE 0.9 % IV BOLUS
1000.0000 mL | Freq: Once | INTRAVENOUS | Status: AC
  Administered 2020-05-17: 1000 mL via INTRAVENOUS

## 2020-05-17 MED ORDER — ACETAMINOPHEN 325 MG PO TABS
650.0000 mg | ORAL_TABLET | Freq: Once | ORAL | Status: AC
  Administered 2020-05-17: 650 mg via ORAL
  Filled 2020-05-17: qty 2
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
## 2020-05-17 NOTE — Discharge Instructions (Addendum)
You were seen here in the ER for Covid-like symptoms.  Your Covid test today is positive.  Your chest x-ray did show evidence of pneumonia.  This is likely due to the COVID-19 virus, and thus antibiotics are not indicated.  Please make sure to quarantine for at least 7 to 10 days.  You may take over-the-counter Tylenol/ibuprofen and cold and flu medication for your symptoms.  Make sure to drink plenty of fluids.  Please return to the ER if you have worsening shortness of breath.  You may buy over-the-counter pulse oximeter  which can be found at your local pharmacy to measure your oxygen, if your oxygen drops below 90%, please come to the ER.     Person Under Monitoring Name: Sydney Deleon  Location: [address] [place] [address]   Infection Prevention Recommendations for Individuals Confirmed to have, or Being Evaluated for, 2019 Novel Coronavirus (COVID-19) Infection Who Receive Care at Home  Individuals who are confirmed to have, or are being evaluated for, COVID-19 should follow the prevention steps below until a healthcare provider or local or state health department says they can return to normal activities.  Stay home except to get medical care You should restrict activities outside your home, except for getting medical care. Do not go to work, school, or public areas, and do not use public transportation or taxis.  Call ahead before visiting your doctor Before your medical appointment, call the healthcare provider and tell them that you have, or are being evaluated for, COVID-19 infection. This will help the healthcare provider's office take steps to keep other people from getting infected. Ask your healthcare provider to call the local or state health department.  Monitor your symptoms Seek prompt medical attention if your illness is worsening (e.g., difficulty breathing). Before going to your medical appointment, call the healthcare provider and tell them that you  have, or are being evaluated for, COVID-19 infection. Ask your healthcare provider to call the local or state health department.  Wear a facemask You should wear a facemask that covers your nose and mouth when you are in the same room with other people and when you visit a healthcare provider. People who live with or visit you should also wear a facemask while they are in the same room with you.  Separate yourself from other people in your home As much as possible, you should stay in a different room from other people in your home. Also, you should use a separate bathroom, if available.  Avoid sharing household items You should not share dishes, drinking glasses, cups, eating utensils, towels, bedding, or other items with other people in your home. After using these items, you should wash them thoroughly with soap and water.  Cover your coughs and sneezes Cover your mouth and nose with a tissue when you cough or sneeze, or you can cough or sneeze into your sleeve. Throw used tissues in a lined trash can, and immediately wash your hands with soap and water for at least 20 seconds or use an alcohol-based hand rub.  Wash your [facility] your hands often and thoroughly with soap and water for at least 20 seconds. You can use an alcohol-based hand sanitizer if soap and water are not available and if your hands are not visibly dirty. Avoid touching your eyes, nose, and mouth with unwashed hands.   Prevention Steps for Caregivers and Household Members of Individuals Confirmed to have, or Being Evaluated for, COVID-19 Infection Being Cared  for in the Home  If you live with, or provide care at home for, a person confirmed to have, or being evaluated for, COVID-19 infection please follow these guidelines to prevent infection:  Follow healthcare provider's instructions Make sure that you understand and can help the patient follow any healthcare provider instructions for all care.  Provide  for the patient's basic needs You should help the patient with basic needs in the home and provide support for getting groceries, prescriptions, and other personal needs.  Monitor the patient's symptoms If they are getting sicker, call his or her medical provider and tell them that the patient has, or is being evaluated for, COVID-19 infection. This will help the healthcare provider's office take steps to keep other people from getting infected. Ask the healthcare provider to call the local or state health department.  Limit the number of people who have contact with the patient If possible, have only one caregiver for the patient. Other household members should stay in another home or place of residence. If this is not possible, they should stay in another room, or be separated from the patient as much as possible. Use a separate bathroom, if available. Restrict visitors who do not have an essential need to be in the home.  Keep older adults, very young children, and other sick people away from the patient Keep older adults, very young children, and those who have compromised immune systems or chronic health conditions away from the patient. This includes people with chronic heart, lung, or kidney conditions, diabetes, and cancer.  Ensure good ventilation Make sure that shared spaces in the home have good air flow, such as from an air conditioner or an opened window, weather permitting.  Wash your hands often Wash your hands often and thoroughly with soap and water for at least 20 seconds. You can use an alcohol based hand sanitizer if soap and water are not available and if your hands are not visibly dirty. Avoid touching your eyes, nose, and mouth with unwashed hands. Use disposable paper towels to dry your hands. If not available, use dedicated cloth towels and replace them when they become wet.  Wear a facemask and gloves Wear a disposable facemask at all times in the room and gloves  when you touch or have contact with the patient's blood, body fluids, and/or secretions or excretions, such as sweat, saliva, sputum, nasal mucus, vomit, urine, or feces.  Ensure the mask fits over your nose and mouth tightly, and do not touch it during use. Throw out disposable facemasks and gloves after using them. Do not reuse. Wash your hands immediately after removing your facemask and gloves. If your personal clothing becomes contaminated, carefully remove clothing and launder. Wash your hands after handling contaminated clothing. Place all used disposable facemasks, gloves, and other waste in a lined container before disposing them with other household waste. Remove gloves and wash your hands immediately after handling these items.  Do not share dishes, glasses, or other household items with the patient Avoid sharing household items. You should not share dishes, drinking glasses, cups, eating utensils, towels, bedding, or other items with a patient who is confirmed to have, or being evaluated for, COVID-19 infection. After the person uses these items, you should wash them thoroughly with soap and water.  Wash laundry thoroughly Immediately remove and wash clothes or bedding that have blood, body fluids, and/or secretions or excretions, such as sweat, saliva, sputum, nasal mucus, vomit, urine, or feces, on them. Wear  gloves when handling laundry from the patient. Read and follow directions on labels of laundry or clothing items and detergent. In general, wash and dry with the warmest temperatures recommended on the label.  Clean all areas the individual has used often Clean all touchable surfaces, such as counters, tabletops, doorknobs, bathroom fixtures, toilets, phones, keyboards, tablets, and bedside tables, every day. Also, clean any surfaces that may have blood, body fluids, and/or secretions or excretions on them. Wear gloves when cleaning surfaces the patient has come in contact  with. Use a diluted bleach solution (e.g., dilute bleach with 1 part bleach and 10 parts water) or a household disinfectant with a label that says EPA-registered for coronaviruses. To make a bleach solution at home, add 1 tablespoon of bleach to 1 quart (4 cups) of water. For a larger supply, add  cup of bleach to 1 gallon (16 cups) of water. Read labels of cleaning products and follow recommendations provided on product labels. Labels contain instructions for safe and effective use of the cleaning product including precautions you should take when applying the product, such as wearing gloves or eye protection and making sure you have good ventilation during use of the product. Remove gloves and wash hands immediately after cleaning.  Monitor yourself for signs and symptoms of illness Caregivers and household members are considered close contacts, should monitor their health, and will be asked to limit movement outside of the home to the extent possible. Follow the monitoring steps for close contacts listed on the symptom monitoring form.   ? If you have additional questions, contact your local health department or call the epidemiologist on call at [phone] (available 24/7). ? This guidance is subject to change. For the most up-to-date guidance from [facility], please refer to their website: [email or web address]

## 2020-05-17 NOTE — ED Triage Notes (Signed)
EMS stated,  for the last 2 weeks has been having SOB , loss of taste, smell.

## 2020-05-17 NOTE — ED Provider Notes (Signed)
00:15: Assumed care of patient from [place] PA-C at change of shift pending CTA & disposition.   Please see prior provider note for full H&P.  Briefly upon my discussion with the patient she presents with 1.5-2 weeks of generalized weakness, dyspnea, and loss of taste/smell. She states she feels too fatigued to tell me much else, but she is able to answer questions.   Physical Exam  BP 100/74 (BP Location: Left Arm)    Pulse 100    Temp 99.2 F (37.3 C) (Oral)    Resp (!) 24    LMP 05/14/2020    SpO2 94%   Physical Exam Vitals and nursing note reviewed.  Constitutional:      Appearance: She is well-developed. She is ill-appearing.  HENT:     Head: Normocephalic and atraumatic.  Eyes:     General:        Right eye: No discharge.        Left eye: No discharge.     Conjunctiva/sclera: Conjunctivae normal.  Cardiovascular:     Rate and Rhythm: Tachycardia present.  Pulmonary:     Effort: Tachypnea present.     Breath sounds: Normal breath sounds. No wheezing.  Neurological:     Mental Status: She is alert.     Comments: Clear speech.   Psychiatric:        Behavior: Behavior normal.        Thought Content: Thought content normal.     ED Course/Procedures      Results for orders placed or performed during the hospital encounter of 05/17/20  SARS Coronavirus 2 by RT PCR (hospital order, performed in [place] hospital lab) Nasopharyngeal Nasopharyngeal Swab   Specimen: Nasopharyngeal Swab  Result Value Ref Range   SARS Coronavirus 2 POSITIVE (A) NEGATIVE  Lactic acid, plasma  Result Value Ref Range   Lactic Acid, Venous 1.0 0.5 - 1.9 mmol/L  Lactic acid, plasma  Result Value Ref Range   Lactic Acid, Venous 1.2 0.5 - 1.9 mmol/L  Comprehensive metabolic panel  Result Value Ref Range   Sodium 139 135 - 145 mmol/L   Potassium 3.6 3.5 - 5.1 mmol/L   Chloride 104 98 - 111 mmol/L   CO2 26 22 - 32 mmol/L   Glucose, Bld 99 70 - 99 mg/dL   BUN 9 6 - 20 mg/dL   Creatinine, Ser  0.91 0.44 - 1.00 mg/dL   Calcium 8.4 (L) 8.9 - 10.3 mg/dL   Total Protein 7.5 6.5 - 8.1 g/dL   Albumin 3.3 (L) 3.5 - 5.0 g/dL   AST 32 15 - 41 U/L   ALT 26 0 - 44 U/L   Alkaline Phosphatase 37 (L) 38 - 126 U/L   Total Bilirubin 0.6 0.3 - 1.2 mg/dL   GFR calc non Af Amer >60 >60 mL/min   GFR calc Af Amer >60 >60 mL/min   Anion gap 9 5 - 15  CBC with Differential  Result Value Ref Range   WBC 4.4 4.0 - 10.5 K/uL   RBC 4.43 3.87 - 5.11 MIL/uL   Hemoglobin 13.0 12.0 - 15.0 g/dL   HCT 40.7 36 - 46 %   MCV 91.9 80.0 - 100.0 fL   MCH 29.3 26.0 - 34.0 pg   MCHC 31.9 30.0 - 36.0 g/dL   RDW 13.3 11.5 - 15.5 %   Platelets 259 150 - 400 K/uL   nRBC 0.0 0.0 - 0.2 %   Neutrophils Relative % 64 %  Neutro Abs 2.9 1.7 - 7.7 K/uL   Lymphocytes Relative 26 %   Lymphs Abs 1.2 0.7 - 4.0 K/uL   Monocytes Relative 8 %   Monocytes Absolute 0.3 0 - 1 K/uL   Eosinophils Relative 1 %   Eosinophils Absolute 0.0 0 - 0 K/uL   Basophils Relative 0 %   Basophils Absolute 0.0 0 - 0 K/uL   WBC Morphology MORPHOLOGY UNREMARKABLE    Immature Granulocytes 1 %   Abs Immature Granulocytes 0.02 0.00 - 0.07 K/uL  Urinalysis, Routine w reflex microscopic  Result Value Ref Range   Color, Urine AMBER (A) YELLOW   APPearance HAZY (A) CLEAR   Specific Gravity, Urine 1.028 1.005 - 1.030   pH 5.0 5.0 - 8.0   Glucose, UA NEGATIVE NEGATIVE mg/dL   Hgb urine dipstick LARGE (A) NEGATIVE   Bilirubin Urine NEGATIVE NEGATIVE   Ketones, ur 20 (A) NEGATIVE mg/dL   Protein, ur >=300 (A) NEGATIVE mg/dL   Nitrite NEGATIVE NEGATIVE   Leukocytes,Ua NEGATIVE NEGATIVE   RBC / HPF 0-5 0 - 5 RBC/hpf   WBC, UA 0-5 0 - 5 WBC/hpf   Bacteria, UA NONE SEEN NONE SEEN   Squamous Epithelial / LPF 0-5 0 - 5   Mucus PRESENT    Hyaline Casts, UA PRESENT   Rapid urine drug screen (hospital performed)  Result Value Ref Range   Opiates NONE DETECTED NONE DETECTED   Cocaine NONE DETECTED NONE DETECTED   Benzodiazepines NONE DETECTED  NONE DETECTED   Amphetamines NONE DETECTED NONE DETECTED   Tetrahydrocannabinol NONE DETECTED NONE DETECTED   Barbiturates NONE DETECTED NONE DETECTED  Ethanol  Result Value Ref Range   Alcohol, Ethyl (B) <10 <10 mg/dL  I-Stat Beta hCG blood, ED ([facility], [facility], [facility] only)  Result Value Ref Range   I-stat hCG, quantitative <5.0 <5 mIU/mL   Comment 3           CT Angio Chest PE W and/or Wo Contrast  Result Date: 05/18/2020 CLINICAL DATA:  Shortness of breath with loss of taste and smell for 1 week EXAM: CT ANGIOGRAPHY CHEST WITH CONTRAST TECHNIQUE: Multidetector CT imaging of the chest was performed using the standard protocol during bolus administration of intravenous contrast. Multiplanar CT image reconstructions and MIPs were obtained to evaluate the vascular anatomy. CONTRAST:  128m OMNIPAQUE IOHEXOL 350 MG/ML SOLN COMPARISON:  Chest x-ray from earlier in the same day. FINDINGS: Cardiovascular: Thoracic aorta is within normal limits. No cardiac enlargement is noted. Pulmonary artery shows central opacification although the peripheral branches are somewhat limited due in part to timing of the contrast bolus as well as some mild motion artifact. No large central pulmonary embolus is noted. Mediastinum/Nodes: Thoracic inlet is within normal limits. No sizable hilar or mediastinal adenopathy is noted. The esophagus is within normal limits. Lungs/Pleura: Patchy airspace opacities are identified in both lungs consistent with the given clinical history of COVID-19 positivity. No sizable effusion is noted. Upper Abdomen: Fatty infiltration of the liver is seen. Remainder of the upper abdomen is within normal limits. Musculoskeletal: No acute bony abnormality is noted. Review of the MIP images confirms the above findings. IMPRESSION: Changes consistent with the given clinical history of COVID-19 positivity. No evidence of pulmonary emboli. Fatty liver Electronically Signed   By: MInez CatalinaM.D.   On: 05/18/2020  02:12   DG Chest Port 1 View  Result Date: 05/17/2020 CLINICAL DATA:  Shortness of breath.  Recent COVID exposure. EXAM: PORTABLE CHEST 1  VIEW COMPARISON:  07/18/2019 FINDINGS: Lung volumes are low. Patchy bilateral airspace disease in the mid lower lung zone predominant distribution, left greater than right. Heart is normal in size with normal mediastinal contours. No pleural fluid or pneumothorax. No acute osseous abnormalities are seen. IMPRESSION: Low lung volumes. Patchy bilateral airspace disease, left greater than right, consistent with multifocal pneumonia, pattern typical of COVID-19. Electronically Signed   By: Keith Rake M.D.   On: 05/17/2020 15:07     Procedures   CRITICAL CARE Performed by: Kennith Maes   Total critical care time: 30 minutes  Critical care time was exclusive of separately billable procedures and treating other patients.  Critical care was necessary to treat or prevent imminent or life-threatening deterioration.  Critical care was time spent personally by me on the following activities: development of treatment plan with patient and/or surrogate as well as nursing, discussions with consultants, evaluation of patient's response to treatment, examination of patient, obtaining history from patient or surrogate, ordering and performing treatments and interventions, ordering and review of laboratory studies, ordering and review of radiographic studies, pulse oximetry and re-evaluation of patient's condition.   MDM  Work-up reviewed- COVID positive with findings of such on her chest imaging, otherwise unremarkable.   02:40: RE-EVAL: Patient remains not feeling well, states she feels very weak and short of breath, attempted to assess SPO2 with ambulation, patient moved to the edge of the bed and became very dyspneic, respiratory rate in the 40s, SPO2 dropped to 89%, did not have patient fully ambulate, returned to bed.  Applied 1 L via nasal cannula for  supplemental O2 support.  Will order Decadron and discussed with hospital service for admission.  03:11: CONSULT: Discussed with hospitalist Dr. Daryll Drown- accepts admission.   SHONDREA STEINERT was evaluated in Emergency Department on 05/18/2020 for the symptoms described in the history of present illness. He/she was evaluated in the context of the global COVID-19 pandemic, which necessitated consideration that the patient might be at risk for infection with the SARS-CoV-2 virus that causes COVID-19. Institutional protocols and algorithms that pertain to the evaluation of patients at risk for COVID-19 are in a state of rapid change based on information released by regulatory bodies including the CDC and federal and state organizations. These policies and algorithms were followed during the patient's care in the ED.        Leafy Kindle 05/18/20 2060    Palumbo, April, MD 05/18/20 0345

## 2020-05-18 ENCOUNTER — Emergency Department (HOSPITAL_COMMUNITY): Payer: Medicaid Other

## 2020-05-18 ENCOUNTER — Encounter (HOSPITAL_COMMUNITY): Payer: Self-pay | Admitting: Internal Medicine

## 2020-05-18 DIAGNOSIS — J9601 Acute respiratory failure with hypoxia: Secondary | ICD-10-CM | POA: Insufficient documentation

## 2020-05-18 DIAGNOSIS — R0602 Shortness of breath: Secondary | ICD-10-CM | POA: Diagnosis present

## 2020-05-18 DIAGNOSIS — U071 COVID-19: Secondary | ICD-10-CM | POA: Diagnosis present

## 2020-05-18 DIAGNOSIS — J1282 Pneumonia due to coronavirus disease 2019: Secondary | ICD-10-CM

## 2020-05-18 DIAGNOSIS — F4310 Post-traumatic stress disorder, unspecified: Secondary | ICD-10-CM | POA: Diagnosis present

## 2020-05-18 DIAGNOSIS — K219 Gastro-esophageal reflux disease without esophagitis: Secondary | ICD-10-CM | POA: Diagnosis present

## 2020-05-18 DIAGNOSIS — F329 Major depressive disorder, single episode, unspecified: Secondary | ICD-10-CM | POA: Diagnosis not present

## 2020-05-18 DIAGNOSIS — Z818 Family history of other mental and behavioral disorders: Secondary | ICD-10-CM | POA: Diagnosis not present

## 2020-05-18 DIAGNOSIS — K5190 Ulcerative colitis, unspecified, without complications: Secondary | ICD-10-CM | POA: Diagnosis present

## 2020-05-18 DIAGNOSIS — Z79899 Other long term (current) drug therapy: Secondary | ICD-10-CM | POA: Diagnosis not present

## 2020-05-18 DIAGNOSIS — Z888 Allergy status to other drugs, medicaments and biological substances status: Secondary | ICD-10-CM | POA: Diagnosis not present

## 2020-05-18 DIAGNOSIS — F909 Attention-deficit hyperactivity disorder, unspecified type: Secondary | ICD-10-CM | POA: Diagnosis present

## 2020-05-18 DIAGNOSIS — F419 Anxiety disorder, unspecified: Secondary | ICD-10-CM | POA: Diagnosis not present

## 2020-05-18 DIAGNOSIS — F319 Bipolar disorder, unspecified: Secondary | ICD-10-CM | POA: Diagnosis present

## 2020-05-18 DIAGNOSIS — Z8249 Family history of ischemic heart disease and other diseases of the circulatory system: Secondary | ICD-10-CM | POA: Diagnosis not present

## 2020-05-18 DIAGNOSIS — Z6841 Body Mass Index (BMI) 40.0 and over, adult: Secondary | ICD-10-CM | POA: Diagnosis not present

## 2020-05-18 LAB — SARS CORONAVIRUS 2 BY RT PCR (HOSPITAL ORDER, PERFORMED IN ~~LOC~~ HOSPITAL LAB): SARS Coronavirus 2: POSITIVE — AB

## 2020-05-18 LAB — CBC
HCT: 39.3 % (ref 36.0–46.0)
Hemoglobin: 12.6 g/dL (ref 12.0–15.0)
MCH: 29.4 pg (ref 26.0–34.0)
MCHC: 32.1 g/dL (ref 30.0–36.0)
MCV: 91.6 fL (ref 80.0–100.0)
Platelets: 255 10*3/uL (ref 150–400)
RBC: 4.29 MIL/uL (ref 3.87–5.11)
RDW: 13.3 % (ref 11.5–15.5)
WBC: 3.9 10*3/uL — ABNORMAL LOW (ref 4.0–10.5)
nRBC: 0 % (ref 0.0–0.2)

## 2020-05-18 LAB — C-REACTIVE PROTEIN: CRP: 3.9 mg/dL — ABNORMAL HIGH (ref <1.0)

## 2020-05-18 LAB — CREATININE, SERUM
Creatinine, Ser: 0.87 mg/dL (ref 0.44–1.00)
GFR calc Af Amer: >60 mL/min (ref >60)
GFR calc non Af Amer: >60 mL/min (ref >60)

## 2020-05-18 LAB — LACTATE DEHYDROGENASE: LDH: 270 U/L — ABNORMAL HIGH (ref 98–192)

## 2020-05-18 LAB — FIBRINOGEN: Fibrinogen: 453 mg/dL (ref 210–475)

## 2020-05-18 LAB — FERRITIN: Ferritin: 110 ng/mL (ref 11–307)

## 2020-05-18 LAB — PROCALCITONIN: Procalcitonin: <0.1 ng/mL

## 2020-05-18 LAB — D-DIMER, QUANTITATIVE: D-Dimer, Quant: 0.64 ug/mL-FEU — ABNORMAL HIGH (ref 0.00–0.50)

## 2020-05-18 LAB — TRIGLYCERIDES: Triglycerides: 63 mg/dL (ref <150)

## 2020-05-18 MED ORDER — SODIUM CHLORIDE 0.9 % IV SOLN: 100.0000 mg | Freq: Every day | INTRAVENOUS | Status: DC

## 2020-05-18 MED ORDER — DEXAMETHASONE 6 MG PO TABS
6.0000 mg | ORAL_TABLET | ORAL | Status: DC
  Administered 2020-05-18 – 2020-05-22 (×5): 6 mg via ORAL
  Filled 2020-05-18 (×5): qty 1

## 2020-05-18 MED ORDER — OXYCODONE HCL 5 MG PO TABS: 5.0000 mg | ORAL_TABLET | ORAL | Status: DC | PRN

## 2020-05-18 MED ORDER — DEXAMETHASONE SODIUM PHOSPHATE 10 MG/ML IJ SOLN
6.0000 mg | Freq: Once | INTRAMUSCULAR | Status: AC
  Administered 2020-05-18: 6 mg via INTRAVENOUS
  Filled 2020-05-18: qty 1

## 2020-05-18 MED ORDER — AEROCHAMBER PLUS FLO-VU LARGE MISC: 1.0000 | Freq: Once | Status: DC

## 2020-05-18 MED ORDER — ONDANSETRON HCL 4 MG PO TABS
4.0000 mg | ORAL_TABLET | Freq: Four times a day (QID) | ORAL | Status: DC | PRN
  Administered 2020-05-19: 4 mg via ORAL
  Filled 2020-05-18: qty 1

## 2020-05-18 MED ORDER — ALBUTEROL SULFATE HFA 108 (90 BASE) MCG/ACT IN AERS
2.0000 | INHALATION_SPRAY | Freq: Once | RESPIRATORY_TRACT | Status: AC
  Administered 2020-05-18: 2 via RESPIRATORY_TRACT
  Filled 2020-05-18: qty 6.7

## 2020-05-18 MED ORDER — ACETAMINOPHEN 325 MG PO TABS: 650.0000 mg | ORAL_TABLET | Freq: Four times a day (QID) | ORAL | Status: DC | PRN

## 2020-05-18 MED ORDER — HYDROCOD POLST-CPM POLST ER 10-8 MG/5ML PO SUER
5.0000 mL | Freq: Two times a day (BID) | ORAL | Status: DC | PRN
  Administered 2020-05-18: 5 mL via ORAL
  Filled 2020-05-18: qty 5

## 2020-05-18 MED ORDER — ENOXAPARIN SODIUM 60 MG/0.6ML ~~LOC~~ SOLN
60.0000 mg | SUBCUTANEOUS | Status: DC
  Administered 2020-05-19 – 2020-05-22 (×4): 60 mg via SUBCUTANEOUS
  Filled 2020-05-18 (×4): qty 0.6

## 2020-05-18 MED ORDER — SODIUM CHLORIDE 0.9% FLUSH
3.0000 mL | Freq: Two times a day (BID) | INTRAVENOUS | Status: DC
  Administered 2020-05-18 – 2020-05-22 (×5): 3 mL via INTRAVENOUS

## 2020-05-18 MED ORDER — PRENATAL MULTIVITAMIN CH
1.0000 | ORAL_TABLET | Freq: Every day | ORAL | Status: DC
  Administered 2020-05-18 – 2020-05-22 (×5): 1 via ORAL
  Filled 2020-05-18 (×5): qty 1

## 2020-05-18 MED ORDER — ENOXAPARIN SODIUM 40 MG/0.4ML ~~LOC~~ SOLN
40.0000 mg | SUBCUTANEOUS | Status: DC
  Administered 2020-05-18: 40 mg via SUBCUTANEOUS
  Filled 2020-05-18: qty 0.4

## 2020-05-18 MED ORDER — SODIUM CHLORIDE 0.9 % IV SOLN: 200.0000 mg | Freq: Once | INTRAVENOUS | Status: DC

## 2020-05-18 MED ORDER — SODIUM CHLORIDE 0.9 % IV SOLN
200.0000 mg | Freq: Once | INTRAVENOUS | Status: AC
  Administered 2020-05-18: 200 mg via INTRAVENOUS
  Filled 2020-05-18: qty 40

## 2020-05-18 MED ORDER — ONDANSETRON HCL 4 MG/2ML IJ SOLN
4.0000 mg | Freq: Four times a day (QID) | INTRAMUSCULAR | Status: DC | PRN
  Administered 2020-05-18: 4 mg via INTRAVENOUS
  Filled 2020-05-18: qty 2

## 2020-05-18 MED ORDER — SODIUM CHLORIDE 0.9 % IV SOLN
100.0000 mg | Freq: Every day | INTRAVENOUS | Status: DC
  Administered 2020-05-19 – 2020-05-21 (×3): 100 mg via INTRAVENOUS
  Filled 2020-05-18 (×3): qty 20

## 2020-05-18 MED ORDER — IOHEXOL 350 MG/ML SOLN
100.0000 mL | Freq: Once | INTRAVENOUS | Status: AC | PRN
  Administered 2020-05-18: 100 mL via INTRAVENOUS

## 2020-05-18 MED ORDER — GUAIFENESIN-DM 100-10 MG/5ML PO SYRP
10.0000 mL | ORAL_SOLUTION | ORAL | Status: DC | PRN
  Administered 2020-05-18 – 2020-05-21 (×8): 10 mL via ORAL
  Filled 2020-05-18 (×8): qty 10

## 2020-05-18 NOTE — ED Notes (Signed)
Report called to rn on 5 w 

## 2020-05-18 NOTE — Progress Notes (Signed)
PROGRESS NOTE                                                                                                                                                                                                             Patient Demographics:    Sydney Deleon, is a 29 y.o. female, DOB - 13-Jun-1991, UEA:[id number]  Admit date - 05/17/2020   Admitting Physician Sid Falcon, MD  Outpatient Primary MD for the patient is Nche, Charlene Brooke, NP  LOS - 0   Chief Complaint  Patient presents with  . Shortness of Breath       Brief Narrative    Patient was seen and admitted earlier today by Dr. Peyton Najjar , chart, imaging, and labs were reviewed.  HPI: Sydney Deleon is a 29 y.o. female with medical history significant of ulcerative colitis, PTSD, depression, bipolar 1, asthma, anxiety, ADHD and GERD who presented with 10 days of fever, chills, SOB, cough and muscle aches. She was found to be positive for COVID 19. She was noted to have tachypnea and fatigue when attempting to ambulate.  Further symptoms include nausea, but no vomiting.  She has no chest pain, headache, lightheadedness, passing out, heartburn, swelling, orthopnea, PND, diarrhea or constipation.  She has no dysuria.    ED Course: In the ED, she was found to have a mildly low albumin, LDH was elevated at 270, d dimer was elevated.  WBC was 4.4.  CXR showed airspace disease consistent with COVID 19.  CT scan showed the same, without PE.      Subjective:    Sydney Deleon today has, No headache, No chest pain, does report generalized weakness, fatigue, body ache, reports dyspnea on cough .  Patient was seen with female chaperone CNA Allen :    Active Problems:   Anxiety and depression   Bipolar 1 disorder (HCC)   Ulcerative colitis ([place])   Pneumonia due to COVID-19 virus  Pneumonia due to COVID-19 virus - Tylenol and oxycodone for pain - Start  dexamethasone - monitor for worsening psychological effects - LDH and D dimer elevated, other inflammatory markers pending - Started on remdesivir - Oxygen to keep saturation > 90 while resting and > 88% while asleep - Incentive spirometer - Lower extremity ultrasounds given severe pain today - no  swelling which is reassuring - CTA of the chest was negative for PE - Airborne and contact precautions.  - Telemetry    Anxiety and depression Bipolar 1 disorder - She is listed as being on celexa, but she did not confirm this with me.  Did not start.  Confirm with pharmacy in the AM correct dose and medication    Ulcerative colitis - Currently on no medications, monitor      COVID-19 Labs  Recent Labs    05/18/20 0324  DDIMER 0.64*  FERRITIN 110  LDH 270*  CRP 3.9*    Lab Results  Component Value Date   SARSCOV2NAA POSITIVE (A) 05/17/2020   [place] NEGATIVE 02/21/2020   [place] Not Detected 08/23/2019     Code Status : Full  Disposition Plan  :  Status is: Inpatient  Remains inpatient appropriate because:IV treatments appropriate due to intensity of illness or inability to take PO   Dispo: The patient is from: Home              Anticipated d/c is to: Home              Anticipated d/c date is: > 3 days              Patient currently is not medically stable to d/c.       Consults  :  none  Procedures  : none  DVT Prophylaxis  :  [place] lovenox  Lab Results  Component Value Date   PLT 255 05/18/2020    Antibiotics  :    Anti-infectives (From admission, onward)   Start     Dose/Rate Route Frequency Ordered Stop   05/19/20 1000  remdesivir 100 mg in sodium chloride 0.9 % 100 mL IVPB  Status:  Discontinued       "Followed by" Linked Group Details   100 mg 200 mL/hr over 30 Minutes Intravenous Daily 05/18/20 0603 05/18/20 0606   05/19/20 1000  remdesivir 100 mg in sodium chloride 0.9 % 100 mL IVPB     Discontinue    "Followed by" Linked Group  Details   100 mg 200 mL/hr over 30 Minutes Intravenous Daily 05/18/20 0428 05/23/20 0959   05/18/20 0603  remdesivir 200 mg in sodium chloride 0.9% 250 mL IVPB  Status:  Discontinued       "Followed by" Linked Group Details   200 mg 580 mL/hr over 30 Minutes Intravenous Once 05/18/20 0603 05/18/20 0606   05/18/20 0530  remdesivir 200 mg in sodium chloride 0.9% 250 mL IVPB       "Followed by" Linked Group Details   200 mg 580 mL/hr over 30 Minutes Intravenous Once 05/18/20 0428 05/18/20 1009        Objective:   Vitals:   05/18/20 0518 05/18/20 0636 05/18/20 0734 05/18/20 0800  BP:  105/70 99/76 109/71  Pulse: (!) 107 94 94 90  Resp: 12 21 (!) 25 20  Temp:  99.6 F (37.6 C) 98.4 F (36.9 C) 98.5 F (36.9 C)  TempSrc:  Oral Oral   SpO2: 97% 98% 98% 98%  Weight:  (!) 122.8 kg    Height:  5' 5"  (1.651 m)      Wt Readings from Last 3 Encounters:  05/18/20 (!) 122.8 kg  03/13/20 123.2 kg  02/13/20 130.6 kg     Intake/Output Summary (Last 24 hours) at 05/18/2020 1155 Last data filed at 05/18/2020 0620 Gross per 24 hour  Intake 1100 ml  Output --  Net 1100 ml     Physical Exam  Awake Alert, Oriented X 3, No new F.N deficits, Normal affect Symmetrical Chest wall movement, Good air movement bilaterally, CTAB RRR,No Gallops,Rubs or new Murmurs, No Parasternal Heave +ve B.Sounds, Abd Soft, No tenderness,No rebound - guarding or rigidity. No Cyanosis, Clubbing or edema, No new Rash or bruise     Data Review:    CBC Recent Labs  Lab 05/17/20 1440 05/18/20 0958  WBC 4.4 3.9*  HGB 13.0 12.6  HCT 40.7 39.3  PLT 259 255  MCV 91.9 91.6  MCH 29.3 29.4  MCHC 31.9 32.1  RDW 13.3 13.3  LYMPHSABS 1.2  --   MONOABS 0.3  --   EOSABS 0.0  --   BASOSABS 0.0  --     Chemistries  Recent Labs  Lab 05/17/20 1440 05/18/20 0958  NA 139  --   K 3.6  --   CL 104  --   CO2 26  --   GLUCOSE 99  --   BUN 9  --   CREATININE 0.91 0.87  CALCIUM 8.4*  --   AST 32  --   ALT  26  --   ALKPHOS 37*  --   BILITOT 0.6  --    ------------------------------------------------------------------------------------------------------------------ Recent Labs    05/18/20 0324  TRIG 63    Lab Results  Component Value Date   HGBA1C 5.1 11/07/2019   ------------------------------------------------------------------------------------------------------------------ No results for input(s): TSH, T4TOTAL, T3FREE, THYROIDAB in the last 72 hours.  Invalid input(s): FREET3 ------------------------------------------------------------------------------------------------------------------ Recent Labs    05/18/20 0324  FERRITIN 110    Coagulation profile No results for input(s): INR, PROTIME in the last 168 hours.  Recent Labs    05/18/20 0324  DDIMER 0.64*    Cardiac Enzymes No results for input(s): CKMB, TROPONINI, MYOGLOBIN in the last 168 hours.  Invalid input(s): CK ------------------------------------------------------------------------------------------------------------------ No results found for: BNP  Inpatient Medications  Scheduled Meds: . AeroChamber Plus Flo-Vu Large  1 each Other Once  . dexamethasone  6 mg Oral Q24H  . enoxaparin (LOVENOX) injection  40 mg Subcutaneous Q24H  . prenatal multivitamin  1 tablet Oral Daily  . sodium chloride flush  3 mL Intravenous Once  . sodium chloride flush  3 mL Intravenous Q12H   Continuous Infusions: . [START ON 05/19/2020] remdesivir 100 mg in NS 100 mL     PRN Meds:.acetaminophen, chlorpheniramine-HYDROcodone, guaiFENesin-dextromethorphan, ondansetron **OR** ondansetron (ZOFRAN) IV, oxyCODONE  Micro Results Recent Results (from the past 240 hour(s))  SARS Coronavirus 2 by RT PCR (hospital order, performed in [facility] hospital lab) Nasopharyngeal Nasopharyngeal Swab     Status: Abnormal   Collection Time: 05/17/20  9:48 PM   Specimen: Nasopharyngeal Swab  Result Value Ref Range Status   SARS  Coronavirus 2 POSITIVE (A) NEGATIVE Final    Comment: RESULT CALLED TO, READ BACK BY AND VERIFIED WITH: PA SAMANTHA PATRUCELLI AT [facility] ON 05/18/2020 (NOTE) SARS-CoV-2 target nucleic acids are DETECTED  SARS-CoV-2 RNA is generally detectable in upper respiratory specimens  during the acute phase of infection.  Positive results are indicative  of the presence of the identified virus, but do not rule out bacterial infection or co-infection with other pathogens not detected by the test.  Clinical correlation with patient history and  other diagnostic information is necessary to determine patient infection status.  The expected result is negative.  Fact Sheet for Patients:   StrictlyIdeas.no   Fact Sheet for Healthcare Providers:  BankingDealers.co.za    This test is not yet approved or cleared by the [place] and  has been authorized for detection and/or diagnosis of SARS-CoV-2 by FDA under an Emergency Use Authorization (EUA).  This EUA will remain in ef fect (meaning this test can be used) for the duration of  the COVID-19 declaration under Section 564(b)(1) of the Act, 21 U.S.C. section 360-bbb-3(b)(1), unless the authorization is terminated or revoked sooner.  Performed at [facility], [place] [address], [place], [place] [address]     Radiology Reports CT Angio Chest PE W and/or Wo Contrast  Result Date: 05/18/2020 CLINICAL DATA:  Shortness of breath with loss of taste and smell for 1 week EXAM: CT ANGIOGRAPHY CHEST WITH CONTRAST TECHNIQUE: Multidetector CT imaging of the chest was performed using the standard protocol during bolus administration of intravenous contrast. Multiplanar CT image reconstructions and MIPs were obtained to evaluate the vascular anatomy. CONTRAST:  119m OMNIPAQUE IOHEXOL 350 MG/ML SOLN COMPARISON:  Chest x-ray from earlier in the same day. FINDINGS: Cardiovascular: Thoracic aorta is  within normal limits. No cardiac enlargement is noted. Pulmonary artery shows central opacification although the peripheral branches are somewhat limited due in part to timing of the contrast bolus as well as some mild motion artifact. No large central pulmonary embolus is noted. Mediastinum/Nodes: Thoracic inlet is within normal limits. No sizable hilar or mediastinal adenopathy is noted. The esophagus is within normal limits. Lungs/Pleura: Patchy airspace opacities are identified in both lungs consistent with the given clinical history of COVID-19 positivity. No sizable effusion is noted. Upper Abdomen: Fatty infiltration of the liver is seen. Remainder of the upper abdomen is within normal limits. Musculoskeletal: No acute bony abnormality is noted. Review of the MIP images confirms the above findings. IMPRESSION: Changes consistent with the given clinical history of COVID-19 positivity. No evidence of pulmonary emboli. Fatty liver Electronically Signed   By: MInez CatalinaM.D.   On: 05/18/2020 02:12   DG Chest Port 1 View  Result Date: 05/17/2020 CLINICAL DATA:  Shortness of breath.  Recent COVID exposure. EXAM: PORTABLE CHEST 1 VIEW COMPARISON:  07/18/2019 FINDINGS: Lung volumes are low. Patchy bilateral airspace disease in the mid lower lung zone predominant distribution, left greater than right. Heart is normal in size with normal mediastinal contours. No pleural fluid or pneumothorax. No acute osseous abnormalities are seen. IMPRESSION: Low lung volumes. Patchy bilateral airspace disease, left greater than right, consistent with multifocal pneumonia, pattern typical of COVID-19. Electronically Signed   By: MKeith RakeM.D.   On: 05/17/2020 15:07      DPhillips ClimesM.D on 05/18/2020 at 11:55 AM    [facility] -  Office  [phone]

## 2020-05-18 NOTE — Progress Notes (Signed)
   05/18/20 2227  Vitals  Temp 98.2 F (36.8 C)  Temp Source Oral  BP 118/83  MAP (mmHg) 94  BP Location Right Arm  BP Method Automatic  Patient Position (if appropriate) Lying  Pulse Rate 74  Pulse Rate Source Monitor  ECG Heart Rate 75  Resp 22

## 2020-05-18 NOTE — ED Notes (Signed)
The pt was taken to c-t and returned  Earlier she had no response to any questions I asked her

## 2020-05-18 NOTE — H&P (Addendum)
History and Physical    Sydney Deleon OZD:[id number] DOB: 09/28/1991 DOA: 05/17/2020  PCP: Flossie Buffy, NP  Patient coming from: Home  Chief Complaint: SOB, cough  HPI: Sydney Deleon is a 29 y.o. female with medical history significant of ulcerative colitis, PTSD, depression, bipolar 1, asthma, anxiety, ADHD and GERD who presented with 10 days of fever, chills, SOB, cough and muscle aches. She was found to be positive for COVID 19. She was noted to have tachypnea and fatigue when attempting to ambulate.  Further symptoms include nausea, but no vomiting.  She has no chest pain, headache, lightheadedness, passing out, heartburn, swelling, orthopnea, PND, diarrhea or constipation.  She has no dysuria.    ED Course: In the ED, she was found to have a mildly low albumin, LDH was elevated at 270, d dimer was elevated.  WBC was 4.4.  CXR showed airspace disease consistent with COVID 19.  CT scan showed the same, without PE.    Review of Systems: As per HPI otherwise all other systems reviewed and are negative.   Past Medical History:  Diagnosis Date  . Acid reflux   . ADHD   . Allergy   . Anemia   . Anxiety   . Asthma   . Bipolar 1 disorder ([place])   . Bipolar disease, chronic ([place])    "since a child"    . Chronic bronchitis ([place])   . Depression   . Miscarriage within last 12 months   . PTSD (post-traumatic stress disorder)   . Ulcerative colitis [facility])     Past Surgical History:  Procedure Laterality Date  . CESAREAN SECTION N/A 02/23/2020   Procedure: CESAREAN SECTION;  Surgeon: [name], MD;  Location: [facility] LD ORS;  Service: Obstetrics;  Laterality: N/A;  . TONSILLECTOMY AND ADENOIDECTOMY      Social History  reports that she has never smoked. She has never used smokeless tobacco. She reports that she does not drink alcohol and does not use drugs.  Allergies  Allergen Reactions  . Prednisone Other (See Comments)    Suicidal thoughts, tremors, face  paralysis, homicidal thoughts,caused mental health issues    Family History  Problem Relation Age of Onset  . Hypertension Mother   . Depression Mother   . Colon cancer Cousin        2 cousins  . Breast cancer Cousin   . Rectal cancer Cousin   . Breast cancer Cousin   . Esophageal cancer Neg Hx   . Stomach cancer Neg Hx     Only medication she reported to me was albuterol.  Prior to Admission medications   Medication Sig Start Date End Date Taking? Authorizing Provider  albuterol (PROVENTIL HFA;VENTOLIN HFA) 108 (90 Base) MCG/ACT inhaler Inhale 2 puffs into the lungs every 4 (four) hours as needed for wheezing or shortness of breath. Patient not taking: Reported on 03/13/2020 10/09/18   Sherwood Gambler, MD  Blood Pressure Monitoring (BLOOD PRESSURE KIT) DEVI 1 Device by Does not apply route daily. Large Cuff  ICD 10: Z34.00 Patient not taking: Reported on 03/13/2020 11/07/19   Aletha Halim, MD  Carboxymethylcellul-Glycerin (LUBRICATING EYE DROPS OP) Place 1 drop into both eyes daily as needed (dry eyes).    [provider]  Cholecalciferol (VITAMIN D) 50 MCG (2000 UT) tablet Take 2,000 Units by mouth daily.    [provider]  citalopram (CELEXA) 40 MG tablet Take 1 tablet (40 mg total) by mouth daily. 08/27/19  [place] Crane, MD  Famotidine (PEPCID PO) Take by mouth.    [provider]  fluticasone (FLONASE) 50 MCG/ACT nasal spray Place 1 spray into both nostrils daily. Patient not taking: Reported on 03/13/2020 06/21/19   Petrucelli, Glynda Jaeger, PA-C  ibuprofen (ADVIL) 800 MG tablet Take 1 tablet (800 mg total) by mouth every 6 (six) hours. 02/26/20   Woodroe Mode, MD  loratadine (CLARITIN) 10 MG tablet Take 10 mg by mouth daily as needed for allergies.    [provider]  norethindrone (MICRONOR) 0.35 MG tablet Take 1 tablet (0.35 mg total) by mouth daily. 03/27/20   Rasch, Anderson Malta I, NP  oxyCODONE (OXY IR/ROXICODONE) 5 MG immediate release  tablet Take 1-2 tablets (5-10 mg total) by mouth every 4 (four) hours as needed for moderate pain. 02/26/20   Woodroe Mode, MD  Prenatal MV & Min w/FA-DHA (PRENATAL ADULT GUMMY/DHA/FA PO) Take 2 capsules by mouth daily.    [provider]    Physical Exam: Vitals:   05/17/20 1745 05/17/20 2032 05/17/20 2054 05/17/20 2149  BP: 105/74 (!) 76/52 100/74   Pulse: (!) 120 (!) 118  100  Resp: 16 (!) 24    Temp: (!) 101.3 F (38.5 C) 98.9 F (37.2 C)  99.2 F (37.3 C)  TempSrc: Oral Oral  Oral  SpO2: 97% 94%      Constitutional: Sleepy, reports being in pain Eyes: lids and conjunctivae normal ENMT: Mucous membranes are dry, NCAT Neck: normal, supple Respiratory: Course breath sounds throughout, crackles at bases, no wheezing  Cardiovascular: Tachycardic, regular, no murmur, no LE edema noted Abdomen: Obese, +BS, NT, ND Musculoskeletal: no clubbing / cyanosis. No contractures.  Skin: no rashes, lesions, ulcers on exposed skin.  Neurologic: Very sleepy and fatigued.  Able to move easily in the bed, no focal deficit.  Psychiatric: Alert.  Fatigued.    Labs on Admission: I have personally reviewed following labs and imaging studies  CBC: Recent Labs  Lab 05/17/20 1440  WBC 4.4  NEUTROABS 2.9  HGB 13.0  HCT 40.7  MCV 91.9  PLT 563    Basic Metabolic Panel: Recent Labs  Lab 05/17/20 1440  NA 139  K 3.6  CL 104  CO2 26  GLUCOSE 99  BUN 9  CREATININE 0.91  CALCIUM 8.4*    GFR: CrCl cannot be calculated (Unknown ideal weight.).  Liver Function Tests: Recent Labs  Lab 05/17/20 1440  AST 32  ALT 26  ALKPHOS 37*  BILITOT 0.6  PROT 7.5  ALBUMIN 3.3*    Urine analysis:    Component Value Date/Time   COLORURINE AMBER (A) 05/17/2020 2248   APPEARANCEUR HAZY (A) 05/17/2020 2248   APPEARANCEUR Clear 11/21/2014 2109   LABSPEC 1.028 05/17/2020 2248   LABSPEC 1.025 11/21/2014 2109   PHURINE 5.0 05/17/2020 2248   GLUCOSEU NEGATIVE 05/17/2020 2248    GLUCOSEU Negative 11/21/2014 2109   HGBUR LARGE (A) 05/17/2020 2248   BILIRUBINUR NEGATIVE 05/17/2020 2248   BILIRUBINUR Negative 11/21/2014 2109   KETONESUR 20 (A) 05/17/2020 2248   PROTEINUR >=300 (A) 05/17/2020 2248   NITRITE NEGATIVE 05/17/2020 2248   LEUKOCYTESUR NEGATIVE 05/17/2020 2248   LEUKOCYTESUR Negative 11/21/2014 2109    Radiological Exams on Admission: CT Angio Chest PE W and/or Wo Contrast  Result Date: 05/18/2020 CLINICAL DATA:  Shortness of breath with loss of taste and smell for 1 week EXAM: CT ANGIOGRAPHY CHEST WITH CONTRAST TECHNIQUE: Multidetector CT imaging of the chest was performed using the  standard protocol during bolus administration of intravenous contrast. Multiplanar CT image reconstructions and MIPs were obtained to evaluate the vascular anatomy. CONTRAST:  149m OMNIPAQUE IOHEXOL 350 MG/ML SOLN COMPARISON:  Chest x-ray from earlier in the same day. FINDINGS: Cardiovascular: Thoracic aorta is within normal limits. No cardiac enlargement is noted. Pulmonary artery shows central opacification although the peripheral branches are somewhat limited due in part to timing of the contrast bolus as well as some mild motion artifact. No large central pulmonary embolus is noted. Mediastinum/Nodes: Thoracic inlet is within normal limits. No sizable hilar or mediastinal adenopathy is noted. The esophagus is within normal limits. Lungs/Pleura: Patchy airspace opacities are identified in both lungs consistent with the given clinical history of COVID-19 positivity. No sizable effusion is noted. Upper Abdomen: Fatty infiltration of the liver is seen. Remainder of the upper abdomen is within normal limits. Musculoskeletal: No acute bony abnormality is noted. Review of the MIP images confirms the above findings. IMPRESSION: Changes consistent with the given clinical history of COVID-19 positivity. No evidence of pulmonary emboli. Fatty liver Electronically Signed   By: MInez CatalinaM.D.    On: 05/18/2020 02:12   DG Chest Port 1 View  Result Date: 05/17/2020 CLINICAL DATA:  Shortness of breath.  Recent COVID exposure. EXAM: PORTABLE CHEST 1 VIEW COMPARISON:  07/18/2019 FINDINGS: Lung volumes are low. Patchy bilateral airspace disease in the mid lower lung zone predominant distribution, left greater than right. Heart is normal in size with normal mediastinal contours. No pleural fluid or pneumothorax. No acute osseous abnormalities are seen. IMPRESSION: Low lung volumes. Patchy bilateral airspace disease, left greater than right, consistent with multifocal pneumonia, pattern typical of COVID-19. Electronically Signed   By: MKeith RakeM.D.   On: 05/17/2020 15:07    EKG: Independently reviewed. NSR, Twave inversions lead III  Assessment/Plan Pneumonia due to COVID-19 virus Acute hypoxic respiratory failure - Tylenol and oxycodone for pain - Start dexamethasone - monitor for worsening psychological effects - LDH and D dimer elevated, other inflammatory markers pending - Start remdesivir - Oxygen to keep saturation > 90 while resting and > 88% while asleep - Incentive spirometer - Lower extremity ultrasounds given severe pain today - no swelling which is reassuring - CTA of the chest was negative for PE - Airborne and contact precautions.  - Telemetry    Anxiety and depression Bipolar 1 disorder - She is listed as being on celexa, but she did not confirm this with me.  Did not start.  Confirm with pharmacy in the AM correct dose and medication    Ulcerative colitis - Currently on no medications, monitor    DVT prophylaxis: Lovenox  Code Status:   Full Disposition Plan:   Patient is from:  Home  Anticipated DC to:  Home  Anticipated DC date:  05/23/20  Anticipated DC barriers: Oxygen requirements  Consults called:  None Admission status:  IP telemetry   Severity of Illness: The appropriate patient status for this patient is INPATIENT. Inpatient status is judged  to be reasonable and necessary in order to provide the required intensity of service to ensure the patient's safety. The patient's presenting symptoms, physical exam findings, and initial radiographic and laboratory data in the context of their chronic comorbidities is felt to place them at high risk for further clinical deterioration. Furthermore, it is not anticipated that the patient will be medically stable for discharge from the hospital within 2 midnights of admission. The following factors support the patient status of  inpatient.   " The patient's presenting symptoms include fatigue, SOB, tachypnea. " The worrisome physical exam findings include crackles in lungs, fatigue, lethargy. " The initial radiographic and laboratory data are worrisome because of pneumonia. " The chronic co-morbidities include PTSD, bipolar 1, depression.   * I certify that at the point of admission it is my clinical judgment that the patient will require inpatient hospital care spanning beyond 2 midnights from the point of admission due to high intensity of service, high risk for further deterioration and high frequency of surveillance required.Gilles Chiquito MD [facility]  How to contact the [facility] Attending or Consulting provider [place] or covering provider during after hours [place], for this patient?   1. Check the care team in [facility] and look for a) attending/consulting [facility] provider listed and b) the [facility] team listed 2. Log into [email or web address] and use [place]'s universal password to access. If you do not have the password, please contact the hospital operator. 3. Locate the [facility] provider you are looking for under [facility] and page to a number that you can be directly reached. 4. If you still have difficulty reaching the provider, please page the [facility] (Director on Call) for the Hospitalists listed on amion for assistance.  05/18/2020, 4:31 AM

## 2020-05-18 NOTE — ED Notes (Signed)
Patient ambulated to bedside commode. Patient very lethargic. Patient became increasingly short of breath with rapid breathing during activity. RN aware.

## 2020-05-19 ENCOUNTER — Other Ambulatory Visit: Payer: Self-pay

## 2020-05-19 DIAGNOSIS — F319 Bipolar disorder, unspecified: Secondary | ICD-10-CM

## 2020-05-19 DIAGNOSIS — F329 Major depressive disorder, single episode, unspecified: Secondary | ICD-10-CM

## 2020-05-19 DIAGNOSIS — F419 Anxiety disorder, unspecified: Secondary | ICD-10-CM

## 2020-05-19 LAB — COMPREHENSIVE METABOLIC PANEL
ALT: 20 U/L (ref 0–44)
AST: 21 U/L (ref 15–41)
Albumin: 3 g/dL — ABNORMAL LOW (ref 3.5–5.0)
Alkaline Phosphatase: 32 U/L — ABNORMAL LOW (ref 38–126)
Anion gap: 11 (ref 5–15)
BUN: 11 mg/dL (ref 6–20)
CO2: 25 mmol/L (ref 22–32)
Calcium: 8.7 mg/dL — ABNORMAL LOW (ref 8.9–10.3)
Chloride: 106 mmol/L (ref 98–111)
Creatinine, Ser: 0.73 mg/dL (ref 0.44–1.00)
GFR calc Af Amer: >60 mL/min (ref >60)
GFR calc non Af Amer: >60 mL/min (ref >60)
Glucose, Bld: 113 mg/dL — ABNORMAL HIGH (ref 70–99)
Potassium: 4.2 mmol/L (ref 3.5–5.1)
Sodium: 142 mmol/L (ref 135–145)
Total Bilirubin: 0.4 mg/dL (ref 0.3–1.2)
Total Protein: 6.9 g/dL (ref 6.5–8.1)

## 2020-05-19 LAB — CBC WITH DIFFERENTIAL/PLATELET
Abs Immature Granulocytes: 0 10*3/uL (ref 0.00–0.07)
Basophils Absolute: 0 10*3/uL (ref 0.0–0.1)
Basophils Relative: 0 %
Eosinophils Absolute: 0 10*3/uL (ref 0.0–0.5)
Eosinophils Relative: 0 %
HCT: 35 % — ABNORMAL LOW (ref 36.0–46.0)
Hemoglobin: 11.7 g/dL — ABNORMAL LOW (ref 12.0–15.0)
Lymphocytes Relative: 13 %
Lymphs Abs: 0.5 10*3/uL — ABNORMAL LOW (ref 0.7–4.0)
MCH: 30.7 pg (ref 26.0–34.0)
MCHC: 33.4 g/dL (ref 30.0–36.0)
MCV: 91.9 fL (ref 80.0–100.0)
Monocytes Absolute: 0.4 10*3/uL (ref 0.1–1.0)
Monocytes Relative: 10 %
Neutro Abs: 3.1 10*3/uL (ref 1.7–7.7)
Neutrophils Relative %: 77 %
Platelets: 343 10*3/uL (ref 150–400)
RBC: 3.81 MIL/uL — ABNORMAL LOW (ref 3.87–5.11)
RDW: 13.2 % (ref 11.5–15.5)
WBC: 4 10*3/uL (ref 4.0–10.5)
nRBC: 0 % (ref 0.0–0.2)
nRBC: 0 /100 WBC

## 2020-05-19 LAB — FERRITIN: Ferritin: 109 ng/mL (ref 11–307)

## 2020-05-19 LAB — MAGNESIUM: Magnesium: 2.1 mg/dL (ref 1.7–2.4)

## 2020-05-19 LAB — URINE CULTURE

## 2020-05-19 LAB — C-REACTIVE PROTEIN: CRP: 2.1 mg/dL — ABNORMAL HIGH (ref <1.0)

## 2020-05-19 LAB — PHOSPHORUS: Phosphorus: 3.1 mg/dL (ref 2.5–4.6)

## 2020-05-19 LAB — D-DIMER, QUANTITATIVE: D-Dimer, Quant: 0.51 ug/mL-FEU — ABNORMAL HIGH (ref 0.00–0.50)

## 2020-05-19 MED ORDER — CITALOPRAM HYDROBROMIDE 40 MG PO TABS
40.0000 mg | ORAL_TABLET | Freq: Every day | ORAL | Status: DC
  Administered 2020-05-19 – 2020-05-22 (×4): 40 mg via ORAL
  Filled 2020-05-19 (×4): qty 1

## 2020-05-19 MED ORDER — PANTOPRAZOLE SODIUM 40 MG PO TBEC
40.0000 mg | DELAYED_RELEASE_TABLET | Freq: Every day | ORAL | Status: DC
  Administered 2020-05-19 – 2020-05-22 (×4): 40 mg via ORAL
  Filled 2020-05-19 (×4): qty 1

## 2020-05-19 MED ORDER — VITAMIN D 25 MCG (1000 UNIT) PO TABS
2000.0000 [IU] | ORAL_TABLET | Freq: Every day | ORAL | Status: DC
  Administered 2020-05-19 – 2020-05-22 (×4): 2000 [IU] via ORAL
  Filled 2020-05-19 (×5): qty 2

## 2020-05-19 NOTE — Plan of Care (Signed)
  Problem: Coping: Goal: Psychosocial and spiritual needs will be supported 05/19/2020 0410 by Violet Baldy, RN Outcome: Not Progressing 05/19/2020 0410 by Violet Baldy, RN Outcome: Not Progressing   Problem: Respiratory: Goal: Will maintain a patent airway Outcome: Progressing

## 2020-05-19 NOTE — Progress Notes (Signed)
PROGRESS NOTE                                                                                                                                                                                                             Patient Demographics:    Sydney Deleon, is a 29 y.o. female, DOB - Jan 18, 1991, YEM:[id number]  Admit date - 05/17/2020   Admitting Physician Sid Falcon, MD  Outpatient Primary MD for the patient is Nche, Charlene Brooke, NP  LOS - 1   Chief Complaint  Patient presents with  . Shortness of Breath       Brief Narrative    HPI: Sydney Deleon is a 29 y.o. female with medical history significant of ulcerative colitis, PTSD, depression, bipolar 1, asthma, anxiety, ADHD and GERD who presented with 10 days of fever, chills, SOB, cough and muscle aches. She was found to be positive for COVID 19. She was noted to have tachypnea and fatigue when attempting to ambulate.  Further symptoms include nausea, but no vomiting.  She has no chest pain, headache, lightheadedness, passing out, heartburn, swelling, orthopnea, PND, diarrhea or constipation.  She has no dysuria.    ED Course: In the ED, she was found to have a mildly low albumin, LDH was elevated at 270, d dimer was elevated.  WBC was 4.4.  CXR showed airspace disease consistent with COVID 19.  CT scan showed the same, without PE.      Subjective:    Sydney Deleon today has, No headache, No chest pain, she is still reporting generalized weakness, fatigue, shortness of breath and cough .  Patient was seen with female chaperone RN [place] :    Active Problems:   Anxiety and depression   Bipolar 1 disorder (HCC)   Ulcerative colitis ([place])   Pneumonia due to COVID-19 virus  Pneumonia due to COVID-19 virus -She is on 2 L oxygen this morning . -Was encouraged to use incentive spirometry, flutter valve, out of bed to chair -Continue with steroid. -Continue  with remdesivir. -Minimal oxygen requirement, no indication for Actemra. - CTA of the chest was negative for PE -Continue to trend inflammatory markers    Anxiety and depression Bipolar 1 disorder -continue  with Celexa.  Ulcerative colitis - Currently on no medications, monitor  COVID-19 Labs  Recent Labs    05/18/20 0324  DDIMER 0.64*  FERRITIN 110  LDH 270*  CRP 3.9*    Lab Results  Component Value Date   SARSCOV2NAA POSITIVE (A) 05/17/2020   [place] NEGATIVE 02/21/2020   [place] Not Detected 08/23/2019     Code Status : Full  Disposition Plan  : D/W mother by phone Status is: Inpatient  Remains inpatient appropriate because:IV treatments appropriate due to intensity of illness or inability to take PO   Dispo: The patient is from: Home              Anticipated d/c is to: Home              Anticipated d/c date is: > 3 days              Patient currently is not medically stable to d/c.       Consults  :  none  Procedures  : none  DVT Prophylaxis  :  [place] lovenox  Lab Results  Component Value Date   PLT 255 05/18/2020    Antibiotics  :    Anti-infectives (From admission, onward)   Start     Dose/Rate Route Frequency Ordered Stop   05/19/20 1000  remdesivir 100 mg in sodium chloride 0.9 % 100 mL IVPB  Status:  Discontinued       "Followed by" Linked Group Details   100 mg 200 mL/hr over 30 Minutes Intravenous Daily 05/18/20 0603 05/18/20 0606   05/19/20 1000  remdesivir 100 mg in sodium chloride 0.9 % 100 mL IVPB     Discontinue    "Followed by" Linked Group Details   100 mg 200 mL/hr over 30 Minutes Intravenous Daily 05/18/20 0428 05/23/20 0959   05/18/20 0603  remdesivir 200 mg in sodium chloride 0.9% 250 mL IVPB  Status:  Discontinued       "Followed by" Linked Group Details   200 mg 580 mL/hr over 30 Minutes Intravenous Once 05/18/20 0603 05/18/20 0606   05/18/20 0530  remdesivir 200 mg in sodium chloride 0.9% 250 mL IVPB        "Followed by" Linked Group Details   200 mg 580 mL/hr over 30 Minutes Intravenous Once 05/18/20 0428 05/18/20 1009        Objective:   Vitals:   05/18/20 2150 05/18/20 2227 05/19/20 0423 05/19/20 0440  BP: 119/78 118/83 99/70 109/75  Pulse: 87 74 77 73  Resp: (!) 35 22 22 18   Temp: 98.7 F (37.1 C) 98.2 F (36.8 C)  97.6 F (36.4 C)  TempSrc: Oral Oral Oral Oral  SpO2: 95% 97% 97% 96%  Weight:      Height:        Wt Readings from Last 3 Encounters:  05/18/20 (!) 122.8 kg  03/13/20 123.2 kg  02/13/20 130.6 kg     Intake/Output Summary (Last 24 hours) at 05/19/2020 1235 Last data filed at 05/19/2020 0900 Gross per 24 hour  Intake 240 ml  Output --  Net 240 ml     Physical Exam  Awake Alert, Oriented X 3, No new F.N deficits, Normal affect Symmetrical Chest wall movement, Good air movement bilaterally, CTAB RRR,No Gallops,Rubs or new Murmurs, No Parasternal Heave +ve B.Sounds, Abd Soft, No tenderness, No rebound - guarding or rigidity. No Cyanosis, Clubbing or edema, No new Rash or bruise       Data Review:    CBC Recent Labs  Lab 05/17/20 1440 05/18/20  0958  WBC 4.4 3.9*  HGB 13.0 12.6  HCT 40.7 39.3  PLT 259 255  MCV 91.9 91.6  MCH 29.3 29.4  MCHC 31.9 32.1  RDW 13.3 13.3  LYMPHSABS 1.2  --   MONOABS 0.3  --   EOSABS 0.0  --   BASOSABS 0.0  --     Chemistries  Recent Labs  Lab 05/17/20 1440 05/18/20 0958  NA 139  --   K 3.6  --   CL 104  --   CO2 26  --   GLUCOSE 99  --   BUN 9  --   CREATININE 0.91 0.87  CALCIUM 8.4*  --   AST 32  --   ALT 26  --   ALKPHOS 37*  --   BILITOT 0.6  --    ------------------------------------------------------------------------------------------------------------------ Recent Labs    05/18/20 0324  TRIG 63    Lab Results  Component Value Date   HGBA1C 5.1 11/07/2019   ------------------------------------------------------------------------------------------------------------------ No  results for input(s): TSH, T4TOTAL, T3FREE, THYROIDAB in the last 72 hours.  Invalid input(s): FREET3 ------------------------------------------------------------------------------------------------------------------ Recent Labs    05/18/20 0324  FERRITIN 110    Coagulation profile No results for input(s): INR, PROTIME in the last 168 hours.  Recent Labs    05/18/20 0324  DDIMER 0.64*    Cardiac Enzymes No results for input(s): CKMB, TROPONINI, MYOGLOBIN in the last 168 hours.  Invalid input(s): CK ------------------------------------------------------------------------------------------------------------------ No results found for: BNP  Inpatient Medications  Scheduled Meds: . AeroChamber Plus Flo-Vu Large  1 each Other Once  . dexamethasone  6 mg Oral Q24H  . enoxaparin (LOVENOX) injection  60 mg Subcutaneous Q24H  . prenatal multivitamin  1 tablet Oral Daily  . sodium chloride flush  3 mL Intravenous Once  . sodium chloride flush  3 mL Intravenous Q12H   Continuous Infusions: . remdesivir 100 mg in NS 100 mL 100 mg (05/19/20 0905)   PRN Meds:.acetaminophen, chlorpheniramine-HYDROcodone, guaiFENesin-dextromethorphan, ondansetron **OR** ondansetron (ZOFRAN) IV, oxyCODONE  Micro Results Recent Results (from the past 240 hour(s))  SARS Coronavirus 2 by RT PCR (hospital order, performed in [place] hospital lab) Nasopharyngeal Nasopharyngeal Swab     Status: Abnormal   Collection Time: 05/17/20  9:48 PM   Specimen: Nasopharyngeal Swab  Result Value Ref Range Status   SARS Coronavirus 2 POSITIVE (A) NEGATIVE Final    Comment: RESULT CALLED TO, READ BACK BY AND VERIFIED WITH: PA SAMANTHA PATRUCELLI AT [facility] ON 05/18/2020 (NOTE) SARS-CoV-2 target nucleic acids are DETECTED  SARS-CoV-2 RNA is generally detectable in upper respiratory specimens  during the acute phase of infection.  Positive results are indicative  of the presence of the identified virus,  but do not rule out bacterial infection or co-infection with other pathogens not detected by the test.  Clinical correlation with patient history and  other diagnostic information is necessary to determine patient infection status.  The expected result is negative.  Fact Sheet for Patients:   StrictlyIdeas.no   Fact Sheet for Healthcare Providers:   BankingDealers.co.za    This test is not yet approved or cleared by the [place] FDA and  has been authorized for detection and/or diagnosis of SARS-CoV-2 by FDA under an Emergency Use Authorization (EUA).  This EUA will remain in ef fect (meaning this test can be used) for the duration of  the COVID-19 declaration under Section 564(b)(1) of the Act, 21 U.S.C. section 360-bbb-3(b)(1), unless the authorization is terminated or revoked  sooner.  Performed at [facility], [place] [address] [place], [place] [address]   Urine culture     Status: Abnormal   Collection Time: 05/17/20 10:48 PM   Specimen: Urine, Clean Catch  Result Value Ref Range Status   Specimen Description URINE, CLEAN CATCH  Final   Special Requests   Final    NONE Performed at [facility], [place] [address] [place], [place] [address]    Culture MULTIPLE SPECIES PRESENT, SUGGEST RECOLLECTION (A)  Final   Report Status 05/19/2020 FINAL  Final  Blood Culture (routine x 2)     Status: None (Preliminary result)   Collection Time: 05/18/20  3:03 AM   Specimen: BLOOD  Result Value Ref Range Status   Specimen Description BLOOD RIGHT ARM  Final   Special Requests   Final    BOTTLES DRAWN AEROBIC AND ANAEROBIC Blood Culture adequate volume   Culture   Final    NO GROWTH 1 DAY Performed at [facility], [place] [address], [place], [place] [address]    Report Status PENDING  Incomplete  Blood Culture (routine x 2)     Status: None (Preliminary result)   Collection Time: 05/18/20  3:24 AM   Specimen: BLOOD    Result Value Ref Range Status   Specimen Description BLOOD LEFT HAND  Final   Special Requests   Final    BOTTLES DRAWN AEROBIC AND ANAEROBIC Blood Culture results may not be optimal due to an inadequate volume of blood received in culture bottles   Culture   Final    NO GROWTH 1 DAY Performed at [facility], [place] [address], [place], [place] [address]    Report Status PENDING  Incomplete    Radiology Reports CT Angio Chest PE W and/or Wo Contrast  Result Date: 05/18/2020 CLINICAL DATA:  Shortness of breath with loss of taste and smell for 1 week EXAM: CT ANGIOGRAPHY CHEST WITH CONTRAST TECHNIQUE: Multidetector CT imaging of the chest was performed using the standard protocol during bolus administration of intravenous contrast. Multiplanar CT image reconstructions and MIPs were obtained to evaluate the vascular anatomy. CONTRAST:  151m OMNIPAQUE IOHEXOL 350 MG/ML SOLN COMPARISON:  Chest x-ray from earlier in the same day. FINDINGS: Cardiovascular: Thoracic aorta is within normal limits. No cardiac enlargement is noted. Pulmonary artery shows central opacification although the peripheral branches are somewhat limited due in part to timing of the contrast bolus as well as some mild motion artifact. No large central pulmonary embolus is noted. Mediastinum/Nodes: Thoracic inlet is within normal limits. No sizable hilar or mediastinal adenopathy is noted. The esophagus is within normal limits. Lungs/Pleura: Patchy airspace opacities are identified in both lungs consistent with the given clinical history of COVID-19 positivity. No sizable effusion is noted. Upper Abdomen: Fatty infiltration of the liver is seen. Remainder of the upper abdomen is within normal limits. Musculoskeletal: No acute bony abnormality is noted. Review of the MIP images confirms the above findings. IMPRESSION: Changes consistent with the given clinical history of COVID-19 positivity. No evidence of pulmonary emboli. Fatty  liver Electronically Signed   By: MInez CatalinaM.D.   On: 05/18/2020 02:12   DG Chest Port 1 View  Result Date: 05/17/2020 CLINICAL DATA:  Shortness of breath.  Recent COVID exposure. EXAM: PORTABLE CHEST 1 VIEW COMPARISON:  07/18/2019 FINDINGS: Lung volumes are low. Patchy bilateral airspace disease in the mid lower lung zone predominant distribution, left greater than right. Heart is  normal in size with normal mediastinal contours. No pleural fluid or pneumothorax. No acute osseous abnormalities are seen. IMPRESSION: Low lung volumes. Patchy bilateral airspace disease, left greater than right, consistent with multifocal pneumonia, pattern typical of COVID-19. Electronically Signed   By: Keith Rake M.D.   On: 05/17/2020 15:07      Phillips Climes M.D on 05/19/2020 at 12:35 PM    [facility] -  Office  [phone]

## 2020-05-20 LAB — CBC WITH DIFFERENTIAL/PLATELET
Abs Immature Granulocytes: 0 10*3/uL (ref 0.00–0.07)
Basophils Absolute: 0 10*3/uL (ref 0.0–0.1)
Basophils Relative: 0 %
Eosinophils Absolute: 0 10*3/uL (ref 0.0–0.5)
Eosinophils Relative: 0 %
HCT: 37.3 % (ref 36.0–46.0)
Hemoglobin: 11.8 g/dL — ABNORMAL LOW (ref 12.0–15.0)
Lymphocytes Relative: 23 %
Lymphs Abs: 1.4 10*3/uL (ref 0.7–4.0)
MCH: 29.2 pg (ref 26.0–34.0)
MCHC: 31.6 g/dL (ref 30.0–36.0)
MCV: 92.3 fL (ref 80.0–100.0)
Monocytes Absolute: 0.3 10*3/uL (ref 0.1–1.0)
Monocytes Relative: 4 %
Neutro Abs: 4.6 10*3/uL (ref 1.7–7.7)
Neutrophils Relative %: 73 %
Platelets: 364 10*3/uL (ref 150–400)
RBC: 4.04 MIL/uL (ref 3.87–5.11)
RDW: 13.2 % (ref 11.5–15.5)
WBC: 6.3 10*3/uL (ref 4.0–10.5)
nRBC: 0 % (ref 0.0–0.2)
nRBC: 0 /100 WBC

## 2020-05-20 LAB — COMPREHENSIVE METABOLIC PANEL
ALT: 20 U/L (ref 0–44)
AST: 20 U/L (ref 15–41)
Albumin: 3.1 g/dL — ABNORMAL LOW (ref 3.5–5.0)
Alkaline Phosphatase: 31 U/L — ABNORMAL LOW (ref 38–126)
Anion gap: 9 (ref 5–15)
BUN: 13 mg/dL (ref 6–20)
CO2: 24 mmol/L (ref 22–32)
Calcium: 8.6 mg/dL — ABNORMAL LOW (ref 8.9–10.3)
Chloride: 106 mmol/L (ref 98–111)
Creatinine, Ser: 0.73 mg/dL (ref 0.44–1.00)
GFR calc Af Amer: >60 mL/min (ref >60)
GFR calc non Af Amer: >60 mL/min (ref >60)
Glucose, Bld: 101 mg/dL — ABNORMAL HIGH (ref 70–99)
Potassium: 3.7 mmol/L (ref 3.5–5.1)
Sodium: 139 mmol/L (ref 135–145)
Total Bilirubin: 0.5 mg/dL (ref 0.3–1.2)
Total Protein: 7.1 g/dL (ref 6.5–8.1)

## 2020-05-20 LAB — MAGNESIUM: Magnesium: 1.9 mg/dL (ref 1.7–2.4)

## 2020-05-20 LAB — D-DIMER, QUANTITATIVE: D-Dimer, Quant: 0.53 ug/mL-FEU — ABNORMAL HIGH (ref 0.00–0.50)

## 2020-05-20 LAB — C-REACTIVE PROTEIN: CRP: 1.2 mg/dL — ABNORMAL HIGH (ref <1.0)

## 2020-05-20 LAB — FERRITIN: Ferritin: 111 ng/mL (ref 11–307)

## 2020-05-20 LAB — PHOSPHORUS: Phosphorus: 2.6 mg/dL (ref 2.5–4.6)

## 2020-05-20 NOTE — Evaluation (Signed)
Physical Therapy Evaluation Patient Details Name: Sydney Deleon MRN: [id number] DOB: 1991/03/18 Today's Date: 05/20/2020   History of Present Illness  29 y.o. female admitted on 05/17/20 with SOB.  Pt dx with COVID 19 PNA.  CT of chest negative for PE.  Pt with significant PMH of bipolar d/o, ulcerative colitis, PTSD, h/o miscarriage, anemia, ADHD, and c-section 02/23/20).    Clinical Impression  Pt was slow moving, but able to ambulate a good distance down the hallway with the support of the RW and encouragement from PT.  O2 sats were stable on RA (94%) and she reported back and bil lower leg pain L>R (MD, Dr. Johnette Abraham, made aware).  She is very overwhelmed and consumed by the hospitalization since birth of her 71 mo old daughter who is currently at [facility] in [place] which may be adding to her COVID symptomology.  PT will follow acutely, but do not recommend follow up at discharge.       Follow Up Recommendations No PT follow up    Equipment Recommendations  Rolling walker with 5" wheels    Recommendations for Other Services   NA     Precautions / Restrictions Precautions Precautions: Fall Precaution Comments: weak and slow moving      Mobility  Bed Mobility               General bed mobility comments: Pt was OOB in the recliner chair.   Transfers Overall transfer level: Needs assistance Equipment used: Rolling walker (2 wheeled) Transfers: Sit to/from Stand Sit to Stand: Min guard         General transfer comment: min guard assit for safety and line management.   Ambulation/Gait Ambulation/Gait assistance: Min guard Gait Distance (Feet): 80 Feet Assistive device: Rolling walker (2 wheeled) Gait Pattern/deviations: Shuffle;Step-through pattern;Trunk flexed Gait velocity: decreased Gait velocity interpretation: <1.8 ft/sec, indicate of risk for recurrent falls General Gait Details: Cues for upright posture during gait, educated on recent c section and the need to  stand tall to avoid much scarring in her abdominal area.  Pt on RA throughout gait with O2 sats in the mid 90s throughout, HR 60s-70s.  DOE 2/4      Balance Overall balance assessment: Needs assistance Sitting-balance support: Feet supported;No upper extremity supported Sitting balance-Leahy Scale: Good     Standing balance support: Bilateral upper extremity supported;Single extremity supported Standing balance-Leahy Scale: Fair Standing balance comment: can stand statically with supervision, however, with gait preferred the RW due to weakness and fatigue.  no reports of lightheadedness and last few BPs looked stable to mildly elevated.                               Pertinent Vitals/Pain Pain Assessment: Faces Faces Pain Scale: Hurts even more Pain Location: left calf and lower leg Pain Descriptors / Indicators: Burning Pain Intervention(s): Limited activity within patient's tolerance;Monitored during session;Repositioned    Home Living Family/patient expects to be discharged to:: Private residence Living Arrangements: Other relatives;Other (Comment) ("family" would not give details of who) Available Help at Discharge: Family;Available 24 hours/day Type of Home: House Home Access: Stairs to enter Entrance Stairs-Rails: None Entrance Stairs-Number of Steps:  ("a few") Home Layout: One level Home Equipment: None      Prior Function Level of Independence: Independent         Comments: has a job where she works from home on the computer.  Hand Dominance   Dominant Hand: Right    Extremity/Trunk Assessment   Upper Extremity Assessment Upper Extremity Assessment: Generalized weakness    Lower Extremity Assessment Lower Extremity Assessment: Generalized weakness (poor effort, functional strength better than MMT)    Cervical / Trunk Assessment Cervical / Trunk Assessment: Other exceptions Cervical / Trunk Exceptions: pt reports low back pain with  gait, flexed over RW, difficult to maintain upright posture.   Communication   Communication: Other (comment) (very soft spoken, quiet)  Cognition Arousal/Alertness: Awake/alert Behavior During Therapy: Flat affect Overall Cognitive Status: Within Functional Limits for tasks assessed                                 General Comments: Cognition intact, but affect may be a mix of psych history and her home situation with a sick, hospitalized child.       General Comments General comments (skin integrity, edema, etc.): I messaged Dr. Johnette Abraham re: L lower leg calf pain as she has negative CT for PE, but no LE dopplers have been done.  Homan's sign (left calf squeeze) was negative, but information passed along anyway.      Exercises Other Exercises Other Exercises: pt demonstrated good use of flutter valve x 5 reps and incentive spirometer x 5 reps with max inspired volume 750 mL.  Cues for techinique and encouraged pt do to 10 each an hour.    Assessment/Plan    PT Assessment Patient needs continued PT services  PT Problem List Decreased strength;Decreased activity tolerance;Decreased balance;Decreased mobility;Decreased knowledge of use of DME;Decreased knowledge of precautions;Obesity;Pain       PT Treatment Interventions DME instruction;Gait training;Functional mobility training;Stair training;Therapeutic activities;Therapeutic exercise;Balance training;Patient/family education;Modalities    PT Goals (Current goals can be found in the Care Plan section)  Acute Rehab PT Goals Patient Stated Goal: to get stronger so she can return to visiting her daughter at [facility]. PT Goal Formulation: With patient Time For Goal Achievement: 06/03/20 Potential to Achieve Goals: Good    Frequency Min 3X/week        AM-PAC PT "6 Clicks" Mobility  Outcome Measure Help needed turning from your back to your side while in a flat bed without using bedrails?: None Help needed moving from lying  on your back to sitting on the side of a flat bed without using bedrails?: None Help needed moving to and from a bed to a chair (including a wheelchair)?: A Little Help needed standing up from a chair using your arms (e.g., wheelchair or bedside chair)?: A Little Help needed to walk in hospital room?: A Little Help needed climbing 3-5 steps with a railing? : A Little 6 Click Score: 20    End of Session   Activity Tolerance: Patient limited by fatigue;Patient limited by pain Patient left: in chair;with call bell/phone within reach Nurse Communication: Mobility status PT Visit Diagnosis: Muscle weakness (generalized) (M62.81);Difficulty in walking, not elsewhere classified (R26.2);Pain Pain - Right/Left: Left Pain - part of body: Leg (lower leg, low back)    Time: 7858-8502 PT Time Calculation (min) (ACUTE ONLY): 47 min   Charges:       Verdene Lennert, PT, DPT  Acute Rehabilitation [phone] pager #[phone] office     PT Evaluation $PT Eval Moderate Complexity: 1 Mod PT Treatments $Gait Training: 8-22 mins $Therapeutic Activity: 8-22 mins
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
## 2020-05-20 NOTE — Progress Notes (Signed)
PROGRESS NOTE                                                                                                                                                                                                             Patient Demographics:    Sydney Deleon, is a 29 y.o. female, DOB - [birth year], OMB:[id number]  Admit date - 05/17/2020   Admitting Physician Sid Falcon, MD  Outpatient Primary MD for the patient is Nche, Charlene Brooke, NP  LOS - 2   Chief Complaint  Patient presents with  . Shortness of Breath       Brief Narrative    HPI: Sydney Deleon is a 29 y.o. female with medical history significant of ulcerative colitis, PTSD, depression, bipolar 1, asthma, anxiety, ADHD and GERD who presented with 10 days of fever, chills, SOB, cough and muscle aches. She was found to be positive for COVID 19. She was noted to have tachypnea and fatigue when attempting to ambulate.  Further symptoms include nausea, but no vomiting.  She has no chest pain, headache, lightheadedness, passing out, heartburn, swelling, orthopnea, PND, diarrhea or constipation.  She has no dysuria, work-up was significant for mildly low albumin, LDH was elevated at 270, d dimer was elevated.  WBC was 4.4.  CXR showed airspace disease consistent with COVID 19.  CT scan showed the same, without PE.      Subjective:    Sydney Deleon today has, No headache, No chest pain, reports some cough, ambulated in the hallway with no hypoxia today .  Patient was seen with female chaperone Rn [place] :    Active Problems:   Anxiety and depression   Bipolar 1 disorder (HCC)   Ulcerative colitis ([place])   Pneumonia due to COVID-19 virus  Pneumonia due to COVID-19 virus -She is on room air today, ambulating with no hypoxia with activity. -Was encouraged to use incentive spirometry, flutter valve, out of bed to chair -Continue with steroid, finished total of  10 days -Continue with remdesivir, to finish total of 5 days, day 4/5. -Minimal oxygen requirement, no indication for Actemra. - CTA of the chest was negative for PE -Continue to trend inflammatory markers    Anxiety and depression Bipolar 1 disorder -continue  with Celexa.  Ulcerative colitis - Currently on  no medications, monitor      COVID-19 Labs  Recent Labs    05/18/20 0324 05/19/20 1330  DDIMER 0.64* 0.51*  FERRITIN 110 109  LDH 270*  --   CRP 3.9* 2.1*    Lab Results  Component Value Date   SARSCOV2NAA POSITIVE (A) 05/17/2020   [place] NEGATIVE 02/21/2020   [place] Not Detected 08/23/2019     Code Status : Full  Disposition Plan  : D/W mother by phone Status is: Inpatient  Remains inpatient appropriate because:IV treatments appropriate due to intensity of illness or inability to take PO   Dispo: The patient is from: Home              Anticipated d/c is to: Home              Anticipated d/c date is: 1 day discharge home tomorrow after Remdesivir.              Patient currently is not medically stable to d/c.  Needs 1 more day of IV remdesivir.       Consults  :  none  Procedures  : none  DVT Prophylaxis  :  [place] lovenox  Lab Results  Component Value Date   PLT 343 05/19/2020    Antibiotics  :    Anti-infectives (From admission, onward)   Start     Dose/Rate Route Frequency Ordered Stop   05/19/20 1000  remdesivir 100 mg in sodium chloride 0.9 % 100 mL IVPB  Status:  Discontinued       "Followed by" Linked Group Details   100 mg 200 mL/hr over 30 Minutes Intravenous Daily 05/18/20 0603 05/18/20 0606   05/19/20 1000  remdesivir 100 mg in sodium chloride 0.9 % 100 mL IVPB     Discontinue    "Followed by" Linked Group Details   100 mg 200 mL/hr over 30 Minutes Intravenous Daily 05/18/20 0428 05/23/20 0959   05/18/20 0603  remdesivir 200 mg in sodium chloride 0.9% 250 mL IVPB  Status:  Discontinued       "Followed by" Linked Group  Details   200 mg 580 mL/hr over 30 Minutes Intravenous Once 05/18/20 0603 05/18/20 0606   05/18/20 0530  remdesivir 200 mg in sodium chloride 0.9% 250 mL IVPB       "Followed by" Linked Group Details   200 mg 580 mL/hr over 30 Minutes Intravenous Once 05/18/20 0428 05/18/20 1009        Objective:   Vitals:   05/19/20 1200 05/19/20 1937 05/20/20 0421 05/20/20 0700  BP:  104/83 117/61   Pulse:  63 64   Resp:  17 19 (!) 25  Temp: 97.7 F (36.5 C) 98.5 F (36.9 C) 97.8 F (36.6 C)   TempSrc: Oral Oral Oral   SpO2:  99% 100%   Weight:      Height:        Wt Readings from Last 3 Encounters:  05/18/20 (!) 122.8 kg  03/13/20 123.2 kg  02/13/20 130.6 kg     Intake/Output Summary (Last 24 hours) at 05/20/2020 1112 Last data filed at 05/19/2020 2000 Gross per 24 hour  Intake 340 ml  Output --  Net 340 ml     Physical Exam  Awake Alert, Oriented X 3, No new F.N deficits, Normal affect Symmetrical Chest wall movement, Good air movement bilaterally, CTAB RRR,No Gallops,Rubs or new Murmurs, No Parasternal Heave +ve B.Sounds, Abd Soft, No tenderness, No rebound - guarding or rigidity. No  Cyanosis, Clubbing or edema, No new Rash or bruise       Data Review:    CBC Recent Labs  Lab 05/17/20 1440 05/18/20 0958 05/19/20 1330  WBC 4.4 3.9* 4.0  HGB 13.0 12.6 11.7*  HCT 40.7 39.3 35.0*  PLT 259 255 343  MCV 91.9 91.6 91.9  MCH 29.3 29.4 30.7  MCHC 31.9 32.1 33.4  RDW 13.3 13.3 13.2  LYMPHSABS 1.2  --  0.5*  MONOABS 0.3  --  0.4  EOSABS 0.0  --  0.0  BASOSABS 0.0  --  0.0    Chemistries  Recent Labs  Lab 05/17/20 1440 05/18/20 0958 05/19/20 1330  NA 139  --  142  K 3.6  --  4.2  CL 104  --  106  CO2 26  --  25  GLUCOSE 99  --  113*  BUN 9  --  11  CREATININE 0.91 0.87 0.73  CALCIUM 8.4*  --  8.7*  MG  --   --  2.1  AST 32  --  21  ALT 26  --  20  ALKPHOS 37*  --  32*  BILITOT 0.6  --  0.4    ------------------------------------------------------------------------------------------------------------------ Recent Labs    05/18/20 0324  TRIG 63    Lab Results  Component Value Date   HGBA1C 5.1 11/07/2019   ------------------------------------------------------------------------------------------------------------------ No results for input(s): TSH, T4TOTAL, T3FREE, THYROIDAB in the last 72 hours.  Invalid input(s): FREET3 ------------------------------------------------------------------------------------------------------------------ Recent Labs    05/18/20 0324 05/19/20 1330  FERRITIN 110 109    Coagulation profile No results for input(s): INR, PROTIME in the last 168 hours.  Recent Labs    05/18/20 0324 05/19/20 1330  DDIMER 0.64* 0.51*    Cardiac Enzymes No results for input(s): CKMB, TROPONINI, MYOGLOBIN in the last 168 hours.  Invalid input(s): CK ------------------------------------------------------------------------------------------------------------------ No results found for: BNP  Inpatient Medications  Scheduled Meds: . AeroChamber Plus Flo-Vu Large  1 each Other Once  . cholecalciferol  2,000 Units Oral Daily  . citalopram  40 mg Oral Daily  . dexamethasone  6 mg Oral Q24H  . enoxaparin (LOVENOX) injection  60 mg Subcutaneous Q24H  . pantoprazole  40 mg Oral Daily  . prenatal multivitamin  1 tablet Oral Daily  . sodium chloride flush  3 mL Intravenous Once  . sodium chloride flush  3 mL Intravenous Q12H   Continuous Infusions: . remdesivir 100 mg in NS 100 mL 100 mg (05/20/20 0942)   PRN Meds:.acetaminophen, chlorpheniramine-HYDROcodone, guaiFENesin-dextromethorphan, ondansetron **OR** ondansetron (ZOFRAN) IV  Micro Results Recent Results (from the past 240 hour(s))  SARS Coronavirus 2 by RT PCR (hospital order, performed in [place] hospital lab) Nasopharyngeal Nasopharyngeal Swab     Status: Abnormal   Collection Time:  05/17/20  9:48 PM   Specimen: Nasopharyngeal Swab  Result Value Ref Range Status   SARS Coronavirus 2 POSITIVE (A) NEGATIVE Final    Comment: RESULT CALLED TO, READ BACK BY AND VERIFIED WITH: PA SAMANTHA PATRUCELLI AT 0105 BY [facility]ESSAN H. ON 05/18/2020 (NOTE) SARS-CoV-2 target nucleic acids are DETECTED  SARS-CoV-2 RNA is generally detectable in upper respiratory specimens  during the acute phase of infection.  Positive results are indicative  of the presence of the identified virus, but do not rule out bacterial infection or co-infection with other pathogens not detected by the test.  Clinical correlation with patient history and  other diagnostic information is necessary to determine patient infection status.  The expected result  is negative.  Fact Sheet for Patients:   StrictlyIdeas.no   Fact Sheet for Healthcare Providers:   BankingDealers.co.za    This test is not yet approved or cleared by the [place] FDA and  has been authorized for detection and/or diagnosis of SARS-CoV-2 by FDA under an Emergency Use Authorization (EUA).  This EUA will remain in ef fect (meaning this test can be used) for the duration of  the COVID-19 declaration under Section 564(b)(1) of the Act, 21 U.S.C. section 360-bbb-3(b)(1), unless the authorization is terminated or revoked sooner.  Performed at [facility], [place] [address] [place], [place] [address]   Urine culture     Status: Abnormal   Collection Time: 05/17/20 10:48 PM   Specimen: Urine, Clean Catch  Result Value Ref Range Status   Specimen Description URINE, CLEAN CATCH  Final   Special Requests   Final    NONE Performed at [facility], [place] [address], [place], [place] [address]    Culture MULTIPLE SPECIES PRESENT, SUGGEST RECOLLECTION (A)  Final   Report Status 05/19/2020 FINAL  Final  Blood Culture (routine x 2)     Status: None (Preliminary result)   Collection Time:  05/18/20  3:03 AM   Specimen: BLOOD  Result Value Ref Range Status   Specimen Description BLOOD RIGHT ARM  Final   Special Requests   Final    BOTTLES DRAWN AEROBIC AND ANAEROBIC Blood Culture adequate volume   Culture   Final    NO GROWTH 1 DAY Performed at [facility], [place] [address], [place], [place] [address]    Report Status PENDING  Incomplete  Blood Culture (routine x 2)     Status: None (Preliminary result)   Collection Time: 05/18/20  3:24 AM   Specimen: BLOOD  Result Value Ref Range Status   Specimen Description BLOOD LEFT HAND  Final   Special Requests   Final    BOTTLES DRAWN AEROBIC AND ANAEROBIC Blood Culture results may not be optimal due to an inadequate volume of blood received in culture bottles   Culture   Final    NO GROWTH 1 DAY Performed at [facility], [place] [address] [place], [place] [address]    Report Status PENDING  Incomplete    Radiology Reports CT Angio Chest PE W and/or Wo Contrast  Result Date: 05/18/2020 CLINICAL DATA:  Shortness of breath with loss of taste and smell for 1 week EXAM: CT ANGIOGRAPHY CHEST WITH CONTRAST TECHNIQUE: Multidetector CT imaging of the chest was performed using the standard protocol during bolus administration of intravenous contrast. Multiplanar CT image reconstructions and MIPs were obtained to evaluate the vascular anatomy. CONTRAST:  12m OMNIPAQUE IOHEXOL 350 MG/ML SOLN COMPARISON:  Chest x-ray from earlier in the same day. FINDINGS: Cardiovascular: Thoracic aorta is within normal limits. No cardiac enlargement is noted. Pulmonary artery shows central opacification although the peripheral branches are somewhat limited due in part to timing of the contrast bolus as well as some mild motion artifact. No large central pulmonary embolus is noted. Mediastinum/Nodes: Thoracic inlet is within normal limits. No sizable hilar or mediastinal adenopathy is noted. The esophagus is within normal limits. Lungs/Pleura: Patchy  airspace opacities are identified in both lungs consistent with the given clinical history of COVID-19 positivity. No sizable effusion is noted. Upper Abdomen: Fatty infiltration of the liver is seen. Remainder of the upper abdomen is within normal limits. Musculoskeletal: No acute bony abnormality is noted.  Review of the MIP images confirms the above findings. IMPRESSION: Changes consistent with the given clinical history of COVID-19 positivity. No evidence of pulmonary emboli. Fatty liver Electronically Signed   By: Inez Catalina M.D.   On: 05/18/2020 02:12   DG Chest Port 1 View  Result Date: 05/17/2020 CLINICAL DATA:  Shortness of breath.  Recent COVID exposure. EXAM: PORTABLE CHEST 1 VIEW COMPARISON:  07/18/2019 FINDINGS: Lung volumes are low. Patchy bilateral airspace disease in the mid lower lung zone predominant distribution, left greater than right. Heart is normal in size with normal mediastinal contours. No pleural fluid or pneumothorax. No acute osseous abnormalities are seen. IMPRESSION: Low lung volumes. Patchy bilateral airspace disease, left greater than right, consistent with multifocal pneumonia, pattern typical of COVID-19. Electronically Signed   By: Keith Rake M.D.   On: 05/17/2020 15:07      Phillips Climes M.D on 05/20/2020 at 11:12 AM    [facility] -  Office  [phone]

## 2020-05-21 DIAGNOSIS — F332 Major depressive disorder, recurrent severe without psychotic features: Secondary | ICD-10-CM

## 2020-05-21 DIAGNOSIS — Z6841 Body Mass Index (BMI) 40.0 and over, adult: Secondary | ICD-10-CM

## 2020-05-21 DIAGNOSIS — J9601 Acute respiratory failure with hypoxia: Secondary | ICD-10-CM

## 2020-05-21 DIAGNOSIS — K5100 Ulcerative (chronic) pancolitis without complications: Secondary | ICD-10-CM

## 2020-05-21 LAB — COMPREHENSIVE METABOLIC PANEL WITH GFR
ALT: 18 U/L (ref 0–44)
AST: 16 U/L (ref 15–41)
Albumin: 3 g/dL — ABNORMAL LOW (ref 3.5–5.0)
Alkaline Phosphatase: 28 U/L — ABNORMAL LOW (ref 38–126)
Anion gap: 11 (ref 5–15)
BUN: 12 mg/dL (ref 6–20)
CO2: 24 mmol/L (ref 22–32)
Calcium: 8.6 mg/dL — ABNORMAL LOW (ref 8.9–10.3)
Chloride: 106 mmol/L (ref 98–111)
Creatinine, Ser: 0.65 mg/dL (ref 0.44–1.00)
GFR calc Af Amer: >60 mL/min
GFR calc non Af Amer: >60 mL/min
Glucose, Bld: 94 mg/dL (ref 70–99)
Potassium: 3.8 mmol/L (ref 3.5–5.1)
Sodium: 141 mmol/L (ref 135–145)
Total Bilirubin: 0.3 mg/dL (ref 0.3–1.2)
Total Protein: 6.5 g/dL (ref 6.5–8.1)

## 2020-05-21 LAB — CBC WITH DIFFERENTIAL/PLATELET
Abs Immature Granulocytes: 0.04 10*3/uL (ref 0.00–0.07)
Basophils Absolute: 0 10*3/uL (ref 0.0–0.1)
Basophils Relative: 0 %
Eosinophils Absolute: 0 10*3/uL (ref 0.0–0.5)
Eosinophils Relative: 0 %
HCT: 35.4 % — ABNORMAL LOW (ref 36.0–46.0)
Hemoglobin: 11.7 g/dL — ABNORMAL LOW (ref 12.0–15.0)
Immature Granulocytes: 1 %
Lymphocytes Relative: 25 %
Lymphs Abs: 1.7 10*3/uL (ref 0.7–4.0)
MCH: 30.4 pg (ref 26.0–34.0)
MCHC: 33.1 g/dL (ref 30.0–36.0)
MCV: 91.9 fL (ref 80.0–100.0)
Monocytes Absolute: 0.6 10*3/uL (ref 0.1–1.0)
Monocytes Relative: 8 %
Neutro Abs: 4.3 10*3/uL (ref 1.7–7.7)
Neutrophils Relative %: 66 %
Platelets: 357 10*3/uL (ref 150–400)
RBC: 3.85 MIL/uL — ABNORMAL LOW (ref 3.87–5.11)
RDW: 13.2 % (ref 11.5–15.5)
WBC: 6.6 10*3/uL (ref 4.0–10.5)
nRBC: 0 % (ref 0.0–0.2)

## 2020-05-21 LAB — MAGNESIUM: Magnesium: 1.8 mg/dL (ref 1.7–2.4)

## 2020-05-21 LAB — C-REACTIVE PROTEIN: CRP: 1 mg/dL — ABNORMAL HIGH

## 2020-05-21 LAB — PHOSPHORUS: Phosphorus: 3.8 mg/dL (ref 2.5–4.6)

## 2020-05-21 LAB — D-DIMER, QUANTITATIVE: D-Dimer, Quant: 0.41 ug{FEU}/mL (ref 0.00–0.50)

## 2020-05-21 LAB — FERRITIN: Ferritin: 98 ng/mL (ref 11–307)

## 2020-05-21 MED ORDER — REMDESIVIR 100 MG IV SOLR
100.0000 mg | INTRAVENOUS | Status: DC
  Filled 2020-05-21: qty 20

## 2020-05-21 MED ORDER — SODIUM CHLORIDE 0.9 % IV SOLN
100.0000 mg | INTRAVENOUS | Status: AC
  Administered 2020-05-22: 100 mg via INTRAVENOUS
  Filled 2020-05-21: qty 20

## 2020-05-21 NOTE — Progress Notes (Signed)
O2 sat  94% RA at ambulation O2 sat 96-100% at rest

## 2020-05-21 NOTE — Progress Notes (Signed)
PROGRESS NOTE    Sydney Deleon  OEV:[id number] DOB: 02-06-1991 DOA: 05/17/2020 PCP: Flossie Buffy, NP     Brief Narrative:  29 y.o.BF PMHX ulcerative colitis, PTSD, Depression, Bipolar 1, Anxiety, ADHD, Asthma, GERD   Presented with 10 days of fever, chills, SOB, cough and muscle aches. She was found to be positive for COVID 19. She was noted to have tachypnea and fatigue when attempting to ambulate. Further symptoms include nausea, but no vomiting. She has no chest pain, headache, lightheadedness, passing out, heartburn, swelling, orthopnea, PND, diarrhea or constipation. She has no dysuria, work-up was significant for mildly low albumin, LDH was elevated at 270, d dimer was elevated. WBC was 4.4. CXR showed airspace disease consistent with COVID 19. CT scan showed the same, without PE.    Subjective: A/O x4, flat affect.  Negative S OB, negative CP, states has a newborn child (2 months) at home.   Assessment & Plan: Covid vaccination; no vaccination   Active Problems:   Anxiety and depression   Bipolar 1 disorder (HCC)   Ulcerative colitis ([place])   MDD (major depressive disorder), recurrent severe, without psychosis ([place])   BMI 40.0-44.9, adult ([place])   Pneumonia due to COVID-19 virus   Covid pneumonia COVID-19 Labs  Recent Labs    05/19/20 1330 05/20/20 1106 05/21/20 0604  DDIMER 0.51* 0.53* 0.41  FERRITIN 109 111 98  CRP 2.1* 1.2* 1.0*    Lab Results  Component Value Date   SARSCOV2NAA POSITIVE (A) 05/17/2020   [place] NEGATIVE 02/21/2020   [place] Not Detected 08/23/2019  -Patient currently on room air -8/4 obtain ambulatory SPO2 -8/4 spoke with pharmacy who states patient has 1 more dose of Remdesivir and they have agreed to administer at 0 800 on 8/5 -CTA chest negative for PE -Continue to track Covid inflammatory markers  Anxiety and depression -Review of MAR shows patient on no home medication most likely secondary to having  just delivered a child 2 months ago per patient  Bipolar 1 disorder  -Review of MAR shows patient on no home medication most likely secondary to having just delivered a child 2 months ago per patient  Ulcerative colitis -Patient currently on no medication, having no symptoms.     DVT prophylaxis: Lovenox Code Status: Full Family Communication:  Status is: Inpatient    Dispo: The patient is from: Home              Anticipated d/c is to: Home              Anticipated d/c date is: 8/5              Patient currently unstable      Consultants:    Procedures/Significant Events:    I have personally reviewed and interpreted all radiology studies and my findings are as above.  VENTILATOR SETTINGS:    Cultures   Antimicrobials: Anti-infectives (From admission, onward)   Start     Ordered Stop   05/22/20 0800  remdesivir injection 100 mg  Status:  Discontinued        05/21/20 1729 05/21/20 1730   05/22/20 0800  remdesivir 100 mg in sodium chloride 0.9 % 100 mL IVPB     Discontinue     05/21/20 1730 05/23/20 0800   05/19/20 1000  remdesivir 100 mg in sodium chloride 0.9 % 100 mL IVPB  Status:  Discontinued       "Followed by" Linked Group Details   05/18/20 0603 05/18/20  0606   05/19/20 1000  remdesivir 100 mg in sodium chloride 0.9 % 100 mL IVPB  Status:  Discontinued       "Followed by" Linked Group Details   05/18/20 0428 05/21/20 1729   05/18/20 0603  remdesivir 200 mg in sodium chloride 0.9% 250 mL IVPB  Status:  Discontinued       "Followed by" Linked Group Details   05/18/20 0603 05/18/20 0606   05/18/20 0530  remdesivir 200 mg in sodium chloride 0.9% 250 mL IVPB       "Followed by" Linked Group Details   05/18/20 0428 05/18/20 1009       Devices    LINES / TUBES:      Continuous Infusions: . [START ON 05/22/2020] remdesivir 100 mg in NS 100 mL       Objective: Vitals:   05/20/20 2027 05/21/20 0439 05/21/20 0714 05/21/20 1300  BP: 103/71  127/80  120/87  Pulse: 80 (!) 58  (!) 51  Resp: 18 20  20   Temp: 98.2 F (36.8 C) 97.8 F (36.6 C)  97.7 F (36.5 C)  TempSrc: Oral Oral  Oral  SpO2: 93% 97% 100% 96%  Weight:      Height:        Intake/Output Summary (Last 24 hours) at 05/21/2020 1733 Last data filed at 05/21/2020 1300 Gross per 24 hour  Intake 490 ml  Output --  Net 490 ml   Filed Weights   05/18/20 0636  Weight: (!) 122.8 kg    Examination:  General: A/O x4 No acute respiratory distress Eyes: negative scleral hemorrhage, negative anisocoria, negative icterus ENT: Negative Runny nose, negative gingival bleeding, Neck:  Negative scars, masses, torticollis, lymphadenopathy, JVD Lungs: Clear to auscultation bilaterally without wheezes or crackles Cardiovascular: Regular rate and rhythm without murmur gallop or rub normal S1 and S2 Abdomen: negative abdominal pain, nondistended, positive soft, bowel sounds, no rebound, no ascites, no appreciable mass Extremities: No significant cyanosis, clubbing, or edema bilateral lower extremities Skin: Negative rashes, lesions, ulcers Psychiatric:  Negative depression, negative anxiety, negative fatigue, negative mania, flat affect Central nervous system:  Cranial nerves II through XII intact, tongue/uvula midline, all extremities muscle strength 5/5, sensation intact throughout, negative dysarthria, negative expressive aphasia, negative receptive aphasia.  .     Data Reviewed: Care during the described time interval was provided by me .  I have reviewed this patient's available data, including medical history, events of note, physical examination, and all test results as part of my evaluation.  CBC: Recent Labs  Lab 05/17/20 1440 05/18/20 0958 05/19/20 1330 05/20/20 1106 05/21/20 0604  WBC 4.4 3.9* 4.0 6.3 6.6  NEUTROABS 2.9  --  3.1 4.6 4.3  HGB 13.0 12.6 11.7* 11.8* 11.7*  HCT 40.7 39.3 35.0* 37.3 35.4*  MCV 91.9 91.6 91.9 92.3 91.9  PLT 259 255 343 364 916    Basic Metabolic Panel: Recent Labs  Lab 05/17/20 1440 05/18/20 0958 05/19/20 1330 05/20/20 1106 05/21/20 0604  NA 139  --  142 139 141  K 3.6  --  4.2 3.7 3.8  CL 104  --  106 106 106  CO2 26  --  25 24 24   GLUCOSE 99  --  113* 101* 94  BUN 9  --  11 13 12   CREATININE 0.91 0.87 0.73 0.73 0.65  CALCIUM 8.4*  --  8.7* 8.6* 8.6*  MG  --   --  2.1 1.9 1.8  PHOS  --   --  3.1 2.6 3.8   GFR: Estimated Creatinine Clearance: 135.2 mL/min (by C-G formula based on SCr of 0.65 mg/dL). Liver Function Tests: Recent Labs  Lab 05/17/20 1440 05/19/20 1330 05/20/20 1106 05/21/20 0604  AST 32 21 20 16   ALT 26 20 20 18   ALKPHOS 37* 32* 31* 28*  BILITOT 0.6 0.4 0.5 0.3  PROT 7.5 6.9 7.1 6.5  ALBUMIN 3.3* 3.0* 3.1* 3.0*   No results for input(s): LIPASE, AMYLASE in the last 168 hours. No results for input(s): AMMONIA in the last 168 hours. Coagulation Profile: No results for input(s): INR, PROTIME in the last 168 hours. Cardiac Enzymes: No results for input(s): CKTOTAL, CKMB, CKMBINDEX, TROPONINI in the last 168 hours. BNP (last 3 results) No results for input(s): PROBNP in the last 8760 hours. HbA1C: No results for input(s): HGBA1C in the last 72 hours. CBG: No results for input(s): GLUCAP in the last 168 hours. Lipid Profile: No results for input(s): CHOL, HDL, LDLCALC, TRIG, CHOLHDL, LDLDIRECT in the last 72 hours. Thyroid Function Tests: No results for input(s): TSH, T4TOTAL, FREET4, T3FREE, THYROIDAB in the last 72 hours. Anemia Panel: Recent Labs    05/20/20 1106 05/21/20 0604  FERRITIN 111 98   Sepsis Labs: Recent Labs  Lab 05/17/20 1440 05/17/20 2148 05/18/20 0324  PROCALCITON  --   --  <0.10  LATICACIDVEN 1.0 1.2  --     Recent Results (from the past 240 hour(s))  SARS Coronavirus 2 by RT PCR (hospital order, performed in [place] hospital lab) Nasopharyngeal Nasopharyngeal Swab     Status: Abnormal   Collection Time: 05/17/20  9:48 PM   Specimen:  Nasopharyngeal Swab  Result Value Ref Range Status   SARS Coronavirus 2 POSITIVE (A) NEGATIVE Final    Comment: RESULT CALLED TO, READ BACK BY AND VERIFIED WITH: PA SAMANTHA PATRUCELLI AT [place] [facility]. ON 05/18/2020 (NOTE) SARS-CoV-2 target nucleic acids are DETECTED  SARS-CoV-2 RNA is generally detectable in upper respiratory specimens  during the acute phase of infection.  Positive results are indicative  of the presence of the identified virus, but do not rule out bacterial infection or co-infection with other pathogens not detected by the test.  Clinical correlation with patient history and  other diagnostic information is necessary to determine patient infection status.  The expected result is negative.  Fact Sheet for Patients:   StrictlyIdeas.no   Fact Sheet for Healthcare Providers:   BankingDealers.co.za    This test is not yet approved or cleared by the [place] FDA and  has been authorized for detection and/or diagnosis of SARS-CoV-2 by FDA under an Emergency Use Authorization (EUA).  This EUA will remain in ef fect (meaning this test can be used) for the duration of  the COVID-19 declaration under Section 564(b)(1) of the Act, 21 U.S.C. section 360-bbb-3(b)(1), unless the authorization is terminated or revoked sooner.  Performed at [facility], [place] [address] [place], [place] [address]   Urine culture     Status: Abnormal   Collection Time: 05/17/20 10:48 PM   Specimen: Urine, Clean Catch  Result Value Ref Range Status   Specimen Description URINE, CLEAN CATCH  Final   Special Requests   Final    NONE Performed at [facility], [place] [address] [place], [place] [address]    Culture MULTIPLE SPECIES PRESENT, SUGGEST RECOLLECTION (A)  Final   Report Status 05/19/2020 FINAL  Final  Blood Culture (routine x 2)  Status: None (Preliminary result)   Collection Time: 05/18/20  3:03 AM   Specimen:  BLOOD  Result Value Ref Range Status   Specimen Description BLOOD RIGHT ARM  Final   Special Requests   Final    BOTTLES DRAWN AEROBIC AND ANAEROBIC Blood Culture adequate volume   Culture   Final    NO GROWTH 3 DAYS Performed at [facility], [address], [place], [place] [address]    Report Status PENDING  Incomplete  Blood Culture (routine x 2)     Status: None (Preliminary result)   Collection Time: 05/18/20  3:24 AM   Specimen: BLOOD  Result Value Ref Range Status   Specimen Description BLOOD LEFT HAND  Final   Special Requests   Final    BOTTLES DRAWN AEROBIC AND ANAEROBIC Blood Culture results may not be optimal due to an inadequate volume of blood received in culture bottles   Culture   Final    NO GROWTH 3 DAYS Performed at [facility], [place] [address], [place], [place] [address]    Report Status PENDING  Incomplete         Radiology Studies: No results found.      Scheduled Meds: . AeroChamber Plus Flo-Vu Large  1 each Other Once  . cholecalciferol  2,000 Units Oral Daily  . citalopram  40 mg Oral Daily  . dexamethasone  6 mg Oral Q24H  . enoxaparin (LOVENOX) injection  60 mg Subcutaneous Q24H  . pantoprazole  40 mg Oral Daily  . prenatal multivitamin  1 tablet Oral Daily  . sodium chloride flush  3 mL Intravenous Once  . sodium chloride flush  3 mL Intravenous Q12H   Continuous Infusions: . [START ON 05/22/2020] remdesivir 100 mg in NS 100 mL       LOS: 3 days    Time spent:40 min    Welda Azzarello, Geraldo Docker, MD [facility] Pager [phone]  If 7PM-7AM, please contact night-coverage [email or web address] Password [facility] 05/21/2020, 5:33 PM

## 2020-05-22 LAB — CBC WITH DIFFERENTIAL/PLATELET
Abs Immature Granulocytes: 0 10*3/uL (ref 0.00–0.07)
Basophils Absolute: 0 10*3/uL (ref 0.0–0.1)
Basophils Relative: 0 %
Eosinophils Absolute: 0 10*3/uL (ref 0.0–0.5)
Eosinophils Relative: 0 %
HCT: 38.7 % (ref 36.0–46.0)
Hemoglobin: 13 g/dL (ref 12.0–15.0)
Lymphocytes Relative: 24 %
Lymphs Abs: 1.8 10*3/uL (ref 0.7–4.0)
MCH: 30.5 pg (ref 26.0–34.0)
MCHC: 33.6 g/dL (ref 30.0–36.0)
MCV: 90.8 fL (ref 80.0–100.0)
Monocytes Absolute: 0.5 10*3/uL (ref 0.1–1.0)
Monocytes Relative: 6 %
Neutro Abs: 5.4 10*3/uL (ref 1.7–7.7)
Neutrophils Relative %: 70 %
Platelets: 438 10*3/uL — ABNORMAL HIGH (ref 150–400)
RBC: 4.26 MIL/uL (ref 3.87–5.11)
RDW: 13 % (ref 11.5–15.5)
WBC: 7.7 10*3/uL (ref 4.0–10.5)
nRBC: 0 % (ref 0.0–0.2)
nRBC: 1 /100 WBC — ABNORMAL HIGH

## 2020-05-22 LAB — COMPREHENSIVE METABOLIC PANEL
ALT: 18 U/L (ref 0–44)
AST: 15 U/L (ref 15–41)
Albumin: 3.1 g/dL — ABNORMAL LOW (ref 3.5–5.0)
Alkaline Phosphatase: 34 U/L — ABNORMAL LOW (ref 38–126)
Anion gap: 10 (ref 5–15)
BUN: 11 mg/dL (ref 6–20)
CO2: 26 mmol/L (ref 22–32)
Calcium: 9 mg/dL (ref 8.9–10.3)
Chloride: 104 mmol/L (ref 98–111)
Creatinine, Ser: 0.74 mg/dL (ref 0.44–1.00)
GFR calc Af Amer: >60 mL/min (ref >60)
GFR calc non Af Amer: >60 mL/min (ref >60)
Glucose, Bld: 100 mg/dL — ABNORMAL HIGH (ref 70–99)
Potassium: 3.9 mmol/L (ref 3.5–5.1)
Sodium: 140 mmol/L (ref 135–145)
Total Bilirubin: 0.3 mg/dL (ref 0.3–1.2)
Total Protein: 7.3 g/dL (ref 6.5–8.1)

## 2020-05-22 LAB — D-DIMER, QUANTITATIVE: D-Dimer, Quant: 0.38 ug/mL-FEU (ref 0.00–0.50)

## 2020-05-22 LAB — MAGNESIUM: Magnesium: 2 mg/dL (ref 1.7–2.4)

## 2020-05-22 LAB — PHOSPHORUS: Phosphorus: 4.5 mg/dL (ref 2.5–4.6)

## 2020-05-22 LAB — C-REACTIVE PROTEIN: CRP: 1.1 mg/dL — ABNORMAL HIGH (ref <1.0)

## 2020-05-22 LAB — FERRITIN: Ferritin: 83 ng/mL (ref 11–307)

## 2020-05-22 MED ORDER — PRENATAL MULTIVITAMIN CH: 1.0000 | ORAL_TABLET | Freq: Every day | ORAL | 0 refills | Status: DC

## 2020-05-22 MED ORDER — DEXAMETHASONE 6 MG PO TABS: 6.0000 mg | ORAL_TABLET | ORAL | 0 refills | Status: DC

## 2020-05-22 MED ORDER — PANTOPRAZOLE SODIUM 40 MG PO TBEC: 40.0000 mg | DELAYED_RELEASE_TABLET | Freq: Every day | ORAL | 0 refills | Status: DC

## 2020-05-22 MED FILL — PANTOPRAZOLE SOD DR 40 MG T: 40 | 10 days supply | Qty: 10 | Fill #0

## 2020-05-22 MED FILL — DEXAMETHASONE 6 MG TABLET: 6 | 6 days supply | Qty: 6 | Fill #0

## 2020-05-22 NOTE — Progress Notes (Signed)
Patient was discharged home by MD order; discharged instructions review and give to patient with care notes and prescriptions; IV DIC; skin intact; patient will be escorted to the car by nurse tech via wheelchair.  

## 2020-05-22 NOTE — Discharge Summary (Signed)
Physician Discharge Summary  Sydney Deleon FBP:[id number] DOB: 19-Sep-1991 DOA: 05/17/2020  PCP: Flossie Buffy, NP  Admit date: 05/17/2020 Discharge date: 05/22/2020  Time spent: 30 minutes  Recommendations for Outpatient Follow-up:   Covid pneumonia COVID-19 Labs  Recent Labs    05/20/20 1106 05/21/20 0604 05/22/20 0408  DDIMER 0.53* 0.41 0.38  FERRITIN 111 98 83  CRP 1.2* 1.0* 1.1*    Lab Results  Component Value Date   SARSCOV2NAA POSITIVE (A) 05/17/2020   [place] NEGATIVE 02/21/2020   [place] Not Detected 08/23/2019   -Patient currently on room air -8/4 obtain ambulatory SPO2 -8/4 spoke with pharmacy who states patient has 1 more dose of Remdesivir and they have agreed to administer at 0 800 on 8/5 -CTA chest negative for PE -Schedule follow-up in 2 weeks with her NP Wilfred Lacy for Covid pneumonia, untreated anxiety and depression, and bipolar 1 disorder.  Anxiety and depression -Review of MAR shows patient on no home medication most likely secondary to having just delivered a child 2 months ago per patient  Bipolar 1 disorder  -Review of MAR shows patient on no home medication most likely secondary to having just delivered a child 2 months ago per patient  Ulcerative colitis -Patient currently on no medication, having no symptoms.   Discharge Diagnoses:  Active Problems:   Anxiety and depression   Bipolar 1 disorder (HCC)   Ulcerative colitis ([place])   MDD (major depressive disorder), recurrent severe, without psychosis ([place])   BMI 40.0-44.9, adult ([place])   Pneumonia due to COVID-19 virus   Discharge Condition: Stable  Diet recommendation: Regular  Filed Weights   05/18/20 0636  Weight: (!) 122.8 kg    History of present illness:  29 y.o.BF PMHX ulcerative colitis, PTSD, Depression, Bipolar 1, Anxiety, ADHD, Asthma, GERD   Presented with 10 days of fever, chills, SOB, cough and muscle aches. She was found to be positive for  COVID 19. She was noted to have tachypnea and fatigue when attempting to ambulate. Further symptoms include nausea, but no vomiting. She has no chest pain, headache, lightheadedness, passing out, heartburn, swelling, orthopnea, PND, diarrhea or constipation. She has no dysuria,work-up was significant formildly low albumin, LDH was elevated at 270, d dimer was elevated. WBC was 4.4. CXR showed airspace disease consistent with COVID 19. CT scan showed the same, without PE.   Hospital Course:  See above   Cultures   7/31 urine positive multiple species 8/1 blood RIGHT arm NGTD 8/1 blood LEFT hand NGTD   Antibiotics Anti-infectives (From admission, onward)   Start     Ordered Stop   05/22/20 0800  remdesivir injection 100 mg  Status:  Discontinued        05/21/20 1729 05/21/20 1730   05/22/20 0800  remdesivir 100 mg in sodium chloride 0.9 % 100 mL IVPB     Discontinue     05/21/20 1730 05/23/20 0800   05/19/20 1000  remdesivir 100 mg in sodium chloride 0.9 % 100 mL IVPB  Status:  Discontinued       "Followed by" Linked Group Details   05/18/20 0603 05/18/20 0606   05/19/20 1000  remdesivir 100 mg in sodium chloride 0.9 % 100 mL IVPB  Status:  Discontinued       "Followed by" Linked Group Details   05/18/20 0428 05/21/20 1729   05/18/20 0603  remdesivir 200 mg in sodium chloride 0.9% 250 mL IVPB  Status:  Discontinued       "  Followed by" Linked Group Details   05/18/20 0603 05/18/20 0606   05/18/20 0530  remdesivir 200 mg in sodium chloride 0.9% 250 mL IVPB       "Followed by" Linked Group Details   05/18/20 0428 05/18/20 1009       Discharge Exam: Vitals:   05/21/20 0714 05/21/20 1300 05/21/20 2050 05/22/20 0500  BP:  120/87 120/74 136/83  Pulse:  (!) 51 72 70  Resp:  _0 Temp:  97.7 F (36.5 C) 98 F (36.7 C) 98.3 F (36.8 C)  TempSrc:  Oral Oral Oral  SpO2: 100% 96% 90% 98%  Weight:      Height:        General: A/O x4 No acute respiratory  distress Eyes: negative scleral hemorrhage, negative anisocoria, negative icterus ENT: Negative Runny nose, negative gingival bleeding, Neck:  Negative scars, masses, torticollis, lymphadenopathy, JVD Lungs: Clear to auscultation bilaterally without wheezes or crackles Cardiovascular: Regular rate and rhythm without murmur gallop or rub normal S1 and S2    Discharge Instructions   Allergies as of 05/22/2020      Reactions   Prednisone Other (See Comments)   Suicidal thoughts, tremors, face paralysis, homicidal thoughts,caused mental health issues      Medication List    STOP taking these medications   oxyCODONE 5 MG immediate release tablet Commonly known as: Oxy IR/ROXICODONE   PRENATAL ADULT GUMMY/DHA/FA PO Replaced by: prenatal multivitamin Tabs tablet     TAKE these medications   albuterol 108 (90 Base) MCG/ACT inhaler Commonly known as: VENTOLIN HFA Inhale 2 puffs into the lungs every 4 (four) hours as needed for wheezing or shortness of breath.   Blood Pressure Kit Devi 1 Device by Does not apply route daily. Large Cuff  ICD 10: Z34.00   citalopram 40 MG tablet Commonly known as: CELEXA Take 1 tablet (40 mg total) by mouth daily.   dexamethasone 6 MG tablet Commonly known as: DECADRON Take 1 tablet (6 mg total) by mouth daily.   fluticasone 50 MCG/ACT nasal spray Commonly known as: FLONASE Place 1 spray into both nostrils daily.   ibuprofen 800 MG tablet Commonly known as: ADVIL Take 1 tablet (800 mg total) by mouth every 6 (six) hours.   norethindrone 0.35 MG tablet Commonly known as: MICRONOR Take 1 tablet (0.35 mg total) by mouth daily.   pantoprazole 40 MG tablet Commonly known as: PROTONIX Take 1 tablet (40 mg total) by mouth daily.   prenatal multivitamin Tabs tablet Take 1 tablet by mouth daily. Replaces: PRENATAL ADULT GUMMY/DHA/FA PO   Vitamin D 50 MCG (2000 UT) tablet Take 2,000 Units by mouth daily.            Durable Medical  Equipment  (From admission, onward)         Start     Ordered   05/20/20 1601  For home use only DME Walker rolling  Once       Question Answer Comment  Walker: With 5 Inch Wheels   Patient needs a walker to treat with the following condition Weakness      05/20/20 1602         Allergies  Allergen Reactions  . Prednisone Other (See Comments)    Suicidal thoughts, tremors, face paralysis, homicidal thoughts,caused mental health issues    [facility].   Specialty: Emergency Medicine Why: If symptoms worsen Contact information: [address] [id number] mc  [place] [phone]       Schedule an appointment as soon as possible for a visit  with Nche, Charlene Brooke, NP.   Specialty: Internal Medicine Contact information: [place] [address] [phone]                The results of significant diagnostics from this hospitalization (including imaging, microbiology, ancillary and laboratory) are listed below for reference.    Significant Diagnostic Studies: CT Angio Chest PE W and/or Wo Contrast  Result Date: 05/18/2020 CLINICAL DATA:  Shortness of breath with loss of taste and smell for 1 week EXAM: CT ANGIOGRAPHY CHEST WITH CONTRAST TECHNIQUE: Multidetector CT imaging of the chest was performed using the standard protocol during bolus administration of intravenous contrast. Multiplanar CT image reconstructions and MIPs were obtained to evaluate the vascular anatomy. CONTRAST:  146m OMNIPAQUE IOHEXOL 350 MG/ML SOLN COMPARISON:  Chest x-ray from earlier in the same day. FINDINGS: Cardiovascular: Thoracic aorta is within normal limits. No cardiac enlargement is noted. Pulmonary artery shows central opacification although the peripheral branches are somewhat limited due in part to timing of the contrast bolus as well as some mild motion artifact. No  large central pulmonary embolus is noted. Mediastinum/Nodes: Thoracic inlet is within normal limits. No sizable hilar or mediastinal adenopathy is noted. The esophagus is within normal limits. Lungs/Pleura: Patchy airspace opacities are identified in both lungs consistent with the given clinical history of COVID-19 positivity. No sizable effusion is noted. Upper Abdomen: Fatty infiltration of the liver is seen. Remainder of the upper abdomen is within normal limits. Musculoskeletal: No acute bony abnormality is noted. Review of the MIP images confirms the above findings. IMPRESSION: Changes consistent with the given clinical history of COVID-19 positivity. No evidence of pulmonary emboli. Fatty liver Electronically Signed   By: MInez CatalinaM.D.   On: 05/18/2020 02:12   DG Chest Port 1 View  Result Date: 05/17/2020 CLINICAL DATA:  Shortness of breath.  Recent COVID exposure. EXAM: PORTABLE CHEST 1 VIEW COMPARISON:  07/18/2019 FINDINGS: Lung volumes are low. Patchy bilateral airspace disease in the mid lower lung zone predominant distribution, left greater than right. Heart is normal in size with normal mediastinal contours. No pleural fluid or pneumothorax. No acute osseous abnormalities are seen. IMPRESSION: Low lung volumes. Patchy bilateral airspace disease, left greater than right, consistent with multifocal pneumonia, pattern typical of COVID-19. Electronically Signed   By: MKeith RakeM.D.   On: 05/17/2020 15:07    Microbiology: Recent Results (from the past 240 hour(s))  SARS Coronavirus 2 by RT PCR (hospital order[facility] Nasopharyngeal Nasopharyngeal Swab     Status: Abnormal   Collection Time: 05/17/20  9:48 PM   Specimen: Nasopharyngeal Swab  Result Value Ref Range Status   SARS Coronavirus 2 POSITIVE (A) NEGATIVE Final    Comment: RESULT CALLED TO, READ BACK BY AND VERIFIED WITH: PA SAMANTHA PATRUCELLI AT [facility] ON  05/18/2020 (NOTE) SARS-CoV-2 target nucleic acids are DETECTED  SARS-CoV-2 RNA is generally detectable in upper respiratory specimens  during the acute phase of infection.  Positive results are indicative  of the presence of the identified virus, but do not rule out bacterial infection or co-infection with other pathogens not detected by the test.  Clinical correlation with patient history and  other diagnostic information is necessary to determine patient infection status.  The expected result is negative.  Fact Sheet for Patients:  StrictlyIdeas.no   Fact Sheet for Healthcare Providers:   BankingDealers.co.za    This test is not yet approved or cleared by the [place] FDA and  has been authorized for detection and/or diagnosis of SARS-CoV-2 by FDA under an Emergency Use Authorization (EUA).  This EUA will remain in ef fect (meaning this test can be used) for the duration of  the COVID-19 declaration under Section 564(b)(1) of the Act, 21 U.S.C. section 360-bbb-3(b)(1), unless the authorization is terminated or revoked sooner.  Performed at [facility], [place] [address] [place], [place] [address]   Urine culture     Status: Abnormal   Collection Time: 05/17/20 10:48 PM   Specimen: Urine, Clean Catch  Result Value Ref Range Status   Specimen Description URINE, CLEAN CATCH  Final   Special Requests   Final    NONE Performed at [facility], [place] [address] [place], [place] [address]    Culture MULTIPLE SPECIES PRESENT, SUGGEST RECOLLECTION (A)  Final   Report Status 05/19/2020 FINAL  Final  Blood Culture (routine x 2)     Status: None (Preliminary result)   Collection Time: 05/18/20  3:03 AM   Specimen: BLOOD  Result Value Ref Range Status   Specimen Description BLOOD RIGHT ARM  Final   Special Requests   Final    BOTTLES DRAWN AEROBIC AND ANAEROBIC Blood Culture adequate volume   Culture   Final    NO GROWTH  3 DAYS Performed at [facility], [place] [address], [place], [place] [address]    Report Status PENDING  Incomplete  Blood Culture (routine x 2)     Status: None (Preliminary result)   Collection Time: 05/18/20  3:24 AM   Specimen: BLOOD  Result Value Ref Range Status   Specimen Description BLOOD LEFT HAND  Final   Special Requests   Final    BOTTLES DRAWN AEROBIC AND ANAEROBIC Blood Culture results may not be optimal due to an inadequate volume of blood received in culture bottles   Culture   Final    NO GROWTH 3 DAYS Performed at [facility], [place] [address] [place], [place] [address]    Report Status PENDING  Incomplete     Labs: Basic Metabolic Panel: Recent Labs  Lab 05/17/20 1440 05/17/20 1440 05/18/20 0958 05/19/20 1330 05/20/20 1106 05/21/20 0604 05/22/20 0408  NA 139  --   --  142 139 141 140  K 3.6  --   --  4.2 3.7 3.8 3.9  CL 104  --   --  106 106 106 104  CO2 26  --   --  _0 GLUCOSE 99  --   --  113* 101* 94 100*  BUN 9  --   --  _1 CREATININE 0.91   < > 0.87 0.73 0.73 0.65 0.74  CALCIUM 8.4*  --   --  8.7* 8.6* 8.6* 9.0  MG  --   --   --  2.1 1.9 1.8 2.0  PHOS  --   --   --  3.1 2.6 3.8 4.5   < > = values in this interval not displayed.   Liver Function Tests: Recent Labs  Lab 05/17/20 1440 05/19/20 1330 05/20/20 1106 05/21/20 0604 05/22/20 0408  AST 32 _2 ALT _3 ALKPHOS 37* 32* 31* 28* 34*  BILITOT 0.6 0.4 0.5  0.3 0.3  PROT 7.5 6.9 7.1 6.5 7.3  ALBUMIN 3.3* 3.0* 3.1* 3.0* 3.1*   No results for input(s): LIPASE, AMYLASE in the last 168 hours. No results for input(s): AMMONIA in the last 168 hours. CBC: Recent Labs  Lab 05/17/20 1440 05/17/20 1440 05/18/20 0958 05/19/20 1330 05/20/20 1106 05/21/20 0604 05/22/20 0408  WBC 4.4   < > 3.9* 4.0 6.3 6.6 7.7  NEUTROABS 2.9  --   --  3.1 4.6 4.3 5.4  HGB 13.0   < > 12.6 11.7* 11.8* 11.7* 13.0  HCT 40.7   < > 39.3 35.0* 37.3 35.4* 38.7   MCV 91.9   < > 91.6 91.9 92.3 91.9 90.8  PLT 259   < > 255 343 364 357 438*   < > = values in this interval not displayed.   Cardiac Enzymes: No results for input(s): CKTOTAL, CKMB, CKMBINDEX, TROPONINI in the last 168 hours. BNP: BNP (last 3 results) No results for input(s): BNP in the last 8760 hours.  ProBNP (last 3 results) No results for input(s): PROBNP in the last 8760 hours.  CBG: No results for input(s): GLUCAP in the last 168 hours.     Signed:  Dia Crawford, MD [facility] [phone] pager

## 2020-05-22 NOTE — Progress Notes (Signed)
Patient is sitting upright in chair and in no acute distress.  Patient ate some of her breakfast not much.  Patient has a flat affect when asking her any questions. She stressed how she was concerned about her newborn.  Patient stated she has not seen a provider as of yet and would like to know any updates from them and is unsure of what is going on .   I advised I will contact provider for her.

## 2020-05-22 NOTE — Plan of Care (Signed)
  Problem: Education: Goal: Knowledge of risk factors and measures for prevention of condition will improve Outcome: Progressing   Problem: Coping: Goal: Psychosocial and spiritual needs will be supported Outcome: Progressing   Problem: Respiratory: Goal: Will maintain a patent airway Outcome: Progressing Goal: Complications related to the disease process, condition or treatment will be avoided or minimized Outcome: Progressing   

## 2020-05-23 LAB — CULTURE, BLOOD (ROUTINE X 2)
Culture: NO GROWTH
Culture: NO GROWTH
Special Requests: ADEQUATE

## 2020-06-10 ENCOUNTER — Encounter: Payer: Self-pay | Admitting: Nurse Practitioner

## 2020-06-10 ENCOUNTER — Telehealth (INDEPENDENT_AMBULATORY_CARE_PROVIDER_SITE_OTHER): Payer: Medicaid Other | Admitting: Nurse Practitioner

## 2020-06-10 DIAGNOSIS — R0602 Shortness of breath: Secondary | ICD-10-CM | POA: Diagnosis not present

## 2020-06-10 DIAGNOSIS — J1282 Pneumonia due to coronavirus disease 2019: Secondary | ICD-10-CM | POA: Diagnosis not present

## 2020-06-10 DIAGNOSIS — U071 COVID-19: Secondary | ICD-10-CM

## 2020-06-10 MED ORDER — IPRATROPIUM-ALBUTEROL 20-100 MCG/ACT IN AERS: 1.0000 | INHALATION_SPRAY | Freq: Four times a day (QID) | RESPIRATORY_TRACT | 1 refills | Status: DC

## 2020-06-10 NOTE — Progress Notes (Signed)
Virtual Visit via Video Note  I connected with@ on 06/10/20 at 11:00 AM EDT by a video enabled telemedicine application and verified that I am speaking with the correct person using two identifiers.  Location: Patient:Home Provider: Office Participants: patient and provider  I discussed the limitations of evaluation and management by telemedicine and the availability of in person appointments. I also discussed with the patient that there may be a patient responsible charge related to this service. The patient expressed understanding and agreed to proceed.  [id number] f/up, covid pneumonia, onset 4weeks ago  History of Present Illness: Hospitalized 05/17/2020-05/22/2020 Positive pneumonia and negative PE. Reviewed lab results and radiology report. Treated with Remdesivir and Decadron O2 sat on room air on day of discharge 98% She does not have pulse oximetry at home. She lives with parents at this time. Her [facility] is [facility] at [facility]. She is not breastfeeding at this time. Use of oral progesterone for contraception.  Pneumonia She complains of shortness of breath. There is no chest tightness, cough, difficulty breathing, frequent throat clearing, hoarse voice, sputum production or wheezing. This is a new problem. The current episode started 1 to 4 weeks ago. The problem occurs intermittently. The problem has been gradually improving. Associated symptoms include dyspnea on exertion, malaise/fatigue and orthopnea. Pertinent negatives include no appetite change, chest pain, fever, myalgias, nasal congestion, rhinorrhea, sneezing, sore throat, sweats, trouble swallowing or weight loss. Her symptoms are aggravated by any activity. Her symptoms are alleviated by beta-agonist. She reports minimal improvement on treatment.  also reports persistent loss of taste and smell. Reports difficulty laying on right side at bedtime, has PND on right side    Observations/Objective: Physical Exam Constitutional:      General: She is not in acute distress. Pulmonary:     Effort: Pulmonary effort is normal.  Neurological:     Mental Status: She is alert and oriented to person, place, and time.  Psychiatric:        Mood and Affect: Mood normal.        Behavior: Behavior normal.        Thought Content: Thought content normal.    Assessment and Plan: Diagnoses and all orders for this visit:  Pneumonia due to COVID-19 virus -     DG Chest 2 View; Future -     Ipratropium-Albuterol (COMBIVENT) 20-100 MCG/ACT AERS respimat; Inhale 1 puff into the lungs every 6 (six) hours. -     Ambulatory referral to Pulmonology -     CBC with Differential/Platelet; Future -     D-Dimer, Quantitative; Future -     Basic metabolic panel; Future  SOB (shortness of breath) on exertion -     DG Chest 2 View; Future -     Ipratropium-Albuterol (COMBIVENT) 20-100 MCG/ACT AERS respimat; Inhale 1 puff into the lungs every 6 (six) hours. -     Ambulatory referral to Pulmonology -     CBC with Differential/Platelet; Future -     D-Dimer, Quantitative; Future -     Basic metabolic panel; Future   Follow Up Instructions: Go to lab for repeat CXR and blood draw Use combivent for SOB Entered referral to pulmonology.   I discussed the assessment and treatment plan with the patient. The patient was provided an opportunity to ask questions and all were answered. The patient agreed with the plan and demonstrated an understanding of the instructions.   The patient was advised to call back or seek an in-person evaluation  if the symptoms worsen or if the condition fails to improve as anticipated.  Wilfred Lacy, NP

## 2020-06-11 ENCOUNTER — Other Ambulatory Visit: Payer: Medicaid Other

## 2020-06-12 ENCOUNTER — Ambulatory Visit (INDEPENDENT_AMBULATORY_CARE_PROVIDER_SITE_OTHER): Payer: Medicaid Other

## 2020-06-12 ENCOUNTER — Encounter: Payer: Self-pay | Admitting: Nurse Practitioner

## 2020-06-12 ENCOUNTER — Other Ambulatory Visit: Payer: Self-pay

## 2020-06-12 ENCOUNTER — Other Ambulatory Visit (INDEPENDENT_AMBULATORY_CARE_PROVIDER_SITE_OTHER): Payer: Medicaid Other

## 2020-06-12 DIAGNOSIS — J1282 Pneumonia due to coronavirus disease 2019: Secondary | ICD-10-CM | POA: Diagnosis not present

## 2020-06-12 DIAGNOSIS — R0602 Shortness of breath: Secondary | ICD-10-CM

## 2020-06-12 DIAGNOSIS — U071 COVID-19: Secondary | ICD-10-CM | POA: Diagnosis not present

## 2020-06-12 LAB — CBC WITH DIFFERENTIAL/PLATELET
Basophils Absolute: 0 10*3/uL (ref 0.0–0.1)
Basophils Relative: 0.5 % (ref 0.0–3.0)
Eosinophils Absolute: 0.2 10*3/uL (ref 0.0–0.7)
Eosinophils Relative: 2.9 % (ref 0.0–5.0)
HCT: 36.3 % (ref 36.0–46.0)
Hemoglobin: 12 g/dL (ref 12.0–15.0)
Lymphocytes Relative: 22.8 % (ref 12.0–46.0)
Lymphs Abs: 1.6 10*3/uL (ref 0.7–4.0)
MCHC: 32.9 g/dL (ref 30.0–36.0)
MCV: 92.5 fl (ref 78.0–100.0)
Monocytes Absolute: 0.4 10*3/uL (ref 0.1–1.0)
Monocytes Relative: 6.3 % (ref 3.0–12.0)
Neutro Abs: 4.7 10*3/uL (ref 1.4–7.7)
Neutrophils Relative %: 67.5 % (ref 43.0–77.0)
Platelets: 274 10*3/uL (ref 150.0–400.0)
RBC: 3.93 Mil/uL (ref 3.87–5.11)
RDW: 14.7 % (ref 11.5–15.5)
WBC: 6.9 10*3/uL (ref 4.0–10.5)

## 2020-06-12 LAB — BASIC METABOLIC PANEL
BUN: 10 mg/dL (ref 6–23)
CO2: 26 mEq/L (ref 19–32)
Calcium: 9.3 mg/dL (ref 8.4–10.5)
Chloride: 106 mEq/L (ref 96–112)
Creatinine, Ser: 0.77 mg/dL (ref 0.40–1.20)
GFR: 105.87 mL/min (ref >60.00)
Glucose, Bld: 126 mg/dL — ABNORMAL HIGH (ref 70–99)
Potassium: 4.1 mEq/L (ref 3.5–5.1)
Sodium: 140 mEq/L (ref 135–145)

## 2020-06-13 ENCOUNTER — Encounter: Payer: Self-pay | Admitting: Nurse Practitioner

## 2020-06-13 LAB — D-DIMER, QUANTITATIVE: D-Dimer, Quant: 0.33 mcg/mL FEU (ref <0.50)

## 2020-06-17 ENCOUNTER — Telehealth: Payer: Self-pay | Admitting: Nurse Practitioner

## 2020-06-17 NOTE — Telephone Encounter (Signed)
Called patient and left voicemail for her to call [place] office. We are out of network with her new insurance and she will need to either update her insurance or schedule in the future with her designated PCP.

## 2020-09-24 ENCOUNTER — Telehealth: Payer: Self-pay | Admitting: Gastroenterology

## 2020-09-24 NOTE — Telephone Encounter (Signed)
Called and spoke with patient to let her know that we had not seen her since 2019. She had stated that the medication was prescribed but she is unaware of the name of the medication and would call [facility] to see if she could find out what it is. I went ahead and scheduled her for a follow up appointment on 10/21/2020.

## 2020-09-26 NOTE — Telephone Encounter (Signed)
Yes, she needs an appt as we have not seen her in 2 years.  Thank you,  Jess

## 2020-10-14 ENCOUNTER — Telehealth: Payer: Self-pay | Admitting: Gastroenterology

## 2020-10-14 NOTE — Telephone Encounter (Signed)
The pt states the episode of abd pain and rectal bleeding  was last night.  She states that today she feels fine.  She has a history of UC and has not been seen since 2019. I do not see that she is on any meds for UC and she is not sure either.   She has an appt on 1/4 and will keep that and if symptoms of bleeding or pain return she will go to the ED for eval.

## 2020-10-14 NOTE — Telephone Encounter (Signed)
Pt is requesting a call back from a nurse to discuss her experience last night, pt states she was experiencing severe pain during a bowel movement that when it was finally able to come out she noticed a good amount of blood, pt is requesting a call back from a nurse to advise her what to do.

## 2020-10-21 ENCOUNTER — Ambulatory Visit: Payer: Medicaid Other | Admitting: Gastroenterology

## 2020-10-21 ENCOUNTER — Telehealth: Payer: Self-pay

## 2020-10-21 NOTE — Telephone Encounter (Signed)
Letter created and placed on Dr.Perry's desk

## 2020-10-21 NOTE — Telephone Encounter (Signed)
-----   Message from Loralie Champagne, PA-C sent at 10/21/2020 12:07 PM EST ----- Did not sure who this needed to go to.  Thank you,  Jess  ----- Message ----- From: Irene Shipper, MD Sent: 10/21/2020  10:09 AM EST To: Laban Emperor Zehr, PA-C  You can initiate discharge from practice with my blessing.  Thanks ----- Message ----- From: Loralie Champagne, PA-C Sent: 10/21/2020   9:09 AM EST To: Irene Shipper, MD  Good morning.  This patient no showed for me this morning again, which is her fifth no-show in the past 2 years since she was last seen here (last seen here November 2019).  I wanted to see if you thought it would be fit to discharge her for this.  Thank you,  Jess

## 2020-10-30 ENCOUNTER — Telehealth: Payer: Self-pay | Admitting: Internal Medicine

## 2020-10-30 NOTE — Telephone Encounter (Signed)
Patient dismissed from [facility] by Scarlette Shorts, MD, effective 10/21/20. Dismissal Letter sent out by 1st class mail. K[employer]

## 2020-11-05 ENCOUNTER — Telehealth: Payer: Self-pay | Admitting: Gastroenterology

## 2020-11-06 ENCOUNTER — Ambulatory Visit: Payer: Medicaid Other | Admitting: Gastroenterology

## 2021-01-10 DIAGNOSIS — J45909 Unspecified asthma, uncomplicated: Secondary | ICD-10-CM | POA: Insufficient documentation

## 2021-01-10 DIAGNOSIS — F332 Major depressive disorder, recurrent severe without psychotic features: Secondary | ICD-10-CM | POA: Diagnosis present

## 2021-01-10 DIAGNOSIS — F319 Bipolar disorder, unspecified: Secondary | ICD-10-CM | POA: Insufficient documentation

## 2021-01-10 DIAGNOSIS — F32A Depression, unspecified: Secondary | ICD-10-CM | POA: Insufficient documentation

## 2021-05-06 ENCOUNTER — Encounter (HOSPITAL_COMMUNITY): Payer: Self-pay

## 2021-05-06 ENCOUNTER — Emergency Department (HOSPITAL_COMMUNITY)
Admission: EM | Admit: 2021-05-06 | Discharge: 2021-05-06 | Discharge status: Against Medical Advice | Payer: Medicaid Other | Attending: Emergency Medicine | Admitting: Emergency Medicine

## 2021-05-06 ENCOUNTER — Other Ambulatory Visit: Payer: Self-pay

## 2021-05-06 DIAGNOSIS — N9489 Other specified conditions associated with female genital organs and menstrual cycle: Secondary | ICD-10-CM | POA: Insufficient documentation

## 2021-05-06 DIAGNOSIS — R55 Syncope and collapse: Secondary | ICD-10-CM | POA: Insufficient documentation

## 2021-05-06 DIAGNOSIS — X30XXXA Exposure to excessive natural heat, initial encounter: Secondary | ICD-10-CM | POA: Insufficient documentation

## 2021-05-06 DIAGNOSIS — R110 Nausea: Secondary | ICD-10-CM | POA: Insufficient documentation

## 2021-05-06 DIAGNOSIS — Z8616 Personal history of COVID-19: Secondary | ICD-10-CM | POA: Diagnosis not present

## 2021-05-06 DIAGNOSIS — J45909 Unspecified asthma, uncomplicated: Secondary | ICD-10-CM | POA: Diagnosis not present

## 2021-05-06 DIAGNOSIS — T675XXA Heat exhaustion, unspecified, initial encounter: Secondary | ICD-10-CM | POA: Insufficient documentation

## 2021-05-06 LAB — CBC WITH DIFFERENTIAL/PLATELET
Abs Immature Granulocytes: 0.03 10*3/uL (ref 0.00–0.07)
Basophils Absolute: 0 10*3/uL (ref 0.0–0.1)
Basophils Relative: 0 %
Eosinophils Absolute: 0.1 10*3/uL (ref 0.0–0.5)
Eosinophils Relative: 2 %
HCT: 37.4 % (ref 36.0–46.0)
Hemoglobin: 12.3 g/dL (ref 12.0–15.0)
Immature Granulocytes: 0 %
Lymphocytes Relative: 33 %
Lymphs Abs: 2.8 10*3/uL (ref 0.7–4.0)
MCH: 28.6 pg (ref 26.0–34.0)
MCHC: 32.9 g/dL (ref 30.0–36.0)
MCV: 87 fL (ref 80.0–100.0)
Monocytes Absolute: 0.6 10*3/uL (ref 0.1–1.0)
Monocytes Relative: 7 %
Neutro Abs: 4.9 10*3/uL (ref 1.7–7.7)
Neutrophils Relative %: 58 %
Platelets: 420 10*3/uL — ABNORMAL HIGH (ref 150–400)
RBC: 4.3 MIL/uL (ref 3.87–5.11)
RDW: 14.3 % (ref 11.5–15.5)
WBC: 8.5 10*3/uL (ref 4.0–10.5)
nRBC: 0 % (ref 0.0–0.2)

## 2021-05-06 LAB — COMPREHENSIVE METABOLIC PANEL
ALT: 21 U/L (ref 0–44)
AST: 23 U/L (ref 15–41)
Albumin: 4.2 g/dL (ref 3.5–5.0)
Alkaline Phosphatase: 44 U/L (ref 38–126)
Anion gap: 11 (ref 5–15)
BUN: 17 mg/dL (ref 6–20)
CO2: 20 mmol/L — ABNORMAL LOW (ref 22–32)
Calcium: 9.3 mg/dL (ref 8.9–10.3)
Chloride: 106 mmol/L (ref 98–111)
Creatinine, Ser: 0.8 mg/dL (ref 0.44–1.00)
GFR, Estimated: >60 mL/min (ref >60)
Glucose, Bld: 86 mg/dL (ref 70–99)
Potassium: 3.3 mmol/L — ABNORMAL LOW (ref 3.5–5.1)
Sodium: 137 mmol/L (ref 135–145)
Total Bilirubin: 0.5 mg/dL (ref 0.3–1.2)
Total Protein: 8.2 g/dL — ABNORMAL HIGH (ref 6.5–8.1)

## 2021-05-06 LAB — I-STAT BETA HCG BLOOD, ED (MC, WL, AP ONLY): I-stat hCG, quantitative: <5 m[IU]/mL (ref <5)

## 2021-05-06 LAB — LACTIC ACID, PLASMA: Lactic Acid, Venous: 1.2 mmol/L (ref 0.5–1.9)

## 2021-05-06 LAB — CK: Total CK: 147 U/L (ref 38–234)

## 2021-05-06 MED ORDER — LACTATED RINGERS IV BOLUS
1000.0000 mL | Freq: Once | INTRAVENOUS | Status: AC
  Administered 2021-05-06: 1000 mL via INTRAVENOUS

## 2021-05-06 MED ORDER — ONDANSETRON HCL 4 MG/2ML IJ SOLN
4.0000 mg | Freq: Once | INTRAMUSCULAR | Status: AC
  Administered 2021-05-06: 4 mg via INTRAVENOUS
  Filled 2021-05-06: qty 2

## 2021-05-06 MED ORDER — KETOROLAC TROMETHAMINE 30 MG/ML IJ SOLN
30.0000 mg | Freq: Once | INTRAMUSCULAR | Status: DC
  Filled 2021-05-06: qty 1
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
## 2021-05-06 NOTE — ED Notes (Signed)
Pt eloped, IV intact and on bed. MD made aware.

## 2021-05-06 NOTE — ED Triage Notes (Signed)
Pt BIB EMS  Pt was at [employer] working, got overheated and passed out, she was already sitting down. Pt reports not drinking a lot of fluids today and just wasn't feeling well.   Vitals  Hr 98  O2 room air 99% BP 114/90 CBG 105

## 2021-05-06 NOTE — ED Notes (Signed)
Patient approached triage nurse's station stating she was leaving and requesting patient experience number. IV noted to be removed. Patient ambulatory out of department.

## 2021-05-06 NOTE — ED Provider Notes (Addendum)
Probable heat exhaustion. Hydrate, review labs, reassess for dispo. Physical Exam  BP (!) 147/95   Pulse 79   Temp 98.7 F (37.1 C) (Oral)   Resp 16   Ht 5' 5"  (1.651 m)   Wt 120.2 kg   SpO2 100%   BMI 44.10 kg/m   Physical Exam  ED Course/Procedures     Procedures  MDM  08: 23 patient is currently having fluids infusing.  Her vital signs are normal.  No tachycardia or hypotension.  No fever.  Patient reports she still feels very poorly.  She reports she feels very fatigued and has generalized body aches.  She perceives chills.  He denies any headache.  Denies chest pain or cough.  Denies shortness of breath.  We will continue with hydration.  Will obtain orthostatic vital signs.  We will add COVID testing and CK.   Addendum: When the nurse went in to administer ordered Toradol and check vital signs, patient reports that she absolutely did not say that she had any pain, body aches or chills.  She tells the nurse that she was here for dehydration.  I advised the nurse if the patient did not want the Toradol, that was fine.  Her orthostatic vital signs were normal.  Plan was to finish her liter fluids and anticipate discharge.  Apparently, shortly thereafter patient pulled out her own IV and eloped.    Charlesetta Shanks, MD 05/06/21 [id number]    Charlesetta Shanks, MD 05/06/21 [phone]

## 2021-05-06 NOTE — ED Notes (Signed)
Pt refused pain medication and covid test. MD made aware.

## 2021-05-07 ENCOUNTER — Ambulatory Visit: Payer: Medicaid Other | Admitting: Nurse Practitioner

## 2021-05-07 ENCOUNTER — Encounter: Payer: Self-pay | Admitting: Nurse Practitioner

## 2021-05-07 ENCOUNTER — Telehealth: Payer: Self-pay

## 2021-05-07 VITALS — BP 118/70 | Temp 97.5°F | Resp 18 | Ht 65.0 in | Wt 275.2 lb

## 2021-05-07 DIAGNOSIS — J4530 Mild persistent asthma, uncomplicated: Secondary | ICD-10-CM

## 2021-05-07 DIAGNOSIS — R55 Syncope and collapse: Secondary | ICD-10-CM | POA: Diagnosis not present

## 2021-05-07 DIAGNOSIS — E876 Hypokalemia: Secondary | ICD-10-CM | POA: Diagnosis not present

## 2021-05-07 DIAGNOSIS — K5100 Ulcerative (chronic) pancolitis without complications: Secondary | ICD-10-CM | POA: Diagnosis not present

## 2021-05-07 MED ORDER — IPRATROPIUM-ALBUTEROL 20-100 MCG/ACT IN AERS: 1.0000 | INHALATION_SPRAY | Freq: Four times a day (QID) | RESPIRATORY_TRACT | 1 refills | Status: AC

## 2021-05-07 MED ORDER — POTASSIUM CHLORIDE ER 20 MEQ PO TBCR: 20.0000 meq | EXTENDED_RELEASE_TABLET | Freq: Two times a day (BID) | ORAL | 0 refills | Status: DC

## 2021-05-07 NOTE — Telephone Encounter (Signed)
Pt was seen today, 05/07/21.  She rec'd a return to work letter with the wrong return to work date. It should have return on 05/08/21. Letter also needs to state pt can return to work at full duty with no restrictions  Thank you

## 2021-05-07 NOTE — Progress Notes (Signed)
Subjective:  Patient ID: Sydney Deleon, female    DOB: [birth year]  Age: 30 y.o. MRN: [id number]  CC: Dizziness and Loss of Consciousness (Passed out for 5 min while at work, transported to [employer] )  Loss of Consciousness This is a new problem. The current episode started 1 to 4 weeks ago. The problem occurs intermittently. The problem has been resolved. She lost consciousness for a period of less than 1 minute. The symptoms are aggravated by standing and exertion. Associated symptoms include diaphoresis, light-headedness and nausea. Pertinent negatives include no abdominal pain, auditory change, aura, back pain, bladder incontinence, bowel incontinence, chest pain, clumsiness, confusion, dizziness, fever, focal sensory loss, focal weakness, headaches, malaise/fatigue, palpitations, slurred speech, vertigo, visual change, vomiting or weakness. She has tried drinking and bed rest for the symptoms. There is no history of arrhythmia, CAD, a clotting disorder, CVA, DM, HTN, seizures, a sudden death in family, TIA or vertigo.  Had 3near syncopal episodes in last 3weeks (last incident occurred at [employer] at [place], first 2 incidence occurred at home- while standing to assist her daughter, witnessed by her mother and home nurse). Denies any seizure activity.  Reports hx of UC with acute exacerbation: she has minimized her food intake to once a day. she needs GI referral. States she was dismissed from [facility] due to several No show appts. She denies any blood in stool at this time  Reviewed ED notes and labs results.  Wt Readings from Last 3 Encounters:  05/07/21 275 lb 3.2 oz (124.8 kg)  05/06/21 265 lb (120.2 kg)  05/18/20 (!) 270 lb 11.6 oz (122.8 kg)    Reviewed past Medical, Social and Family history today.  Outpatient Medications Prior to Visit  Medication Sig Dispense Refill   albuterol (PROVENTIL HFA;VENTOLIN HFA) 108 (90 Base) MCG/ACT inhaler Inhale 2 puffs into the lungs  every 4 (four) hours as needed for wheezing or shortness of breath. 1 Inhaler 0   Cholecalciferol (VITAMIN D) 50 MCG (2000 UT) tablet Take 2,000 Units by mouth daily.     citalopram (CELEXA) 40 MG tablet Take 1 tablet (40 mg total) by mouth daily. 90 tablet 0   norethindrone (MICRONOR) 0.35 MG tablet Take 1 tablet (0.35 mg total) by mouth daily. 30 tablet 11   pantoprazole (PROTONIX) 40 MG tablet Take 1 tablet (40 mg total) by mouth daily. 10 tablet 0   Prenatal Vit-Fe Fumarate-FA (PRENATAL MULTIVITAMIN) TABS tablet Take 1 tablet by mouth daily. 30 tablet 0   Ipratropium-Albuterol (COMBIVENT) 20-100 MCG/ACT AERS respimat Inhale 1 puff into the lungs every 6 (six) hours. 4 g 1   calcium-vitamin D (OSCAL WITH D) 500-200 MG-UNIT TABS tablet Take 1 tablet by mouth daily.     Multiple Vitamin (MULTI-VITAMIN) tablet Take 1 tablet by mouth daily.     No facility-administered medications prior to visit.    ROS See HPI  Objective:  BP 118/70 (BP Location: Left Arm, Patient Position: Supine, Cuff Size: Normal)   Temp (!) 97.5 F (36.4 C)   Resp 18   Ht 5' 5"  (1.651 m)   Wt 275 lb 3.2 oz (124.8 kg)   BMI 45.80 kg/m   Physical Exam Vitals reviewed.  Cardiovascular:     Rate and Rhythm: Normal rate and regular rhythm.     Pulses: Normal pulses.     Heart sounds: Normal heart sounds.  Pulmonary:     Effort: Pulmonary effort is normal.     Breath sounds: Normal breath  sounds.  Musculoskeletal:     Cervical back: Normal range of motion and neck supple.     Right lower leg: No edema.     Left lower leg: No edema.  Lymphadenopathy:     Cervical: No cervical adenopathy.  Neurological:     Mental Status: She is alert and oriented to person, place, and time.     Cranial Nerves: No cranial nerve deficit.  Psychiatric:        Mood and Affect: Mood normal.        Behavior: Behavior normal.        Thought Content: Thought content normal.    Assessment & Plan:  This visit occurred during the  SARS-CoV-2 public health emergency.  Safety protocols were in place, including screening questions prior to the visit, additional usage of staff PPE, and extensive cleaning of exam room while observing appropriate contact time as indicated for disinfecting solutions.   Undrea was seen today for dizziness and loss of consciousness.  Diagnoses and all orders for this visit:  Vaso vagal episode  Hypokalemia -     Potassium Chloride ER 20 MEQ TBCR; Take 20 mEq by mouth 2 (two) times daily.  Ulcerative pancolitis without complication ([place]) -     Ambulatory referral to Gastroenterology  Mild persistent asthma without complication -     Ipratropium-Albuterol (COMBIVENT) 20-100 MCG/ACT AERS respimat; Inhale 1 puff into the lungs every 6 (six) hours.  I suspect her symptoms are due to dehydration and inadequate food intake. Advised to Maintain adequate oral hydration and avoid skipping meals. Provided information on how to manage diarrhea and entered referral to [facility] as she requested. I also gave ED precautions.  Problem List Items Addressed This Visit       Respiratory   Asthma   Relevant Medications   Ipratropium-Albuterol (COMBIVENT) 20-100 MCG/ACT AERS respimat     Digestive   Ulcerative colitis ([place])   Relevant Orders   Ambulatory referral to Gastroenterology   Other Visit Diagnoses     Vaso vagal episode    -  Primary   Hypokalemia       Relevant Medications   Potassium Chloride ER 20 MEQ TBCR       Follow-up: Return if symptoms worsen or fail to improve.  Wilfred Lacy, NP

## 2021-05-07 NOTE — Patient Instructions (Signed)
Vaso vagal syncopal episodes are due to dehydration and inadequate food intake. Maintain adequate oral hydration and avoid skipping meals. You will be contacted to schedule appt with GI  Near-Syncope Near-syncope is when you suddenly feel like you might pass out (faint), but you do not actually lose consciousness. This may also be referred to as presyncope. During an episode of near-syncope, you may: Feel dizzy, weak, or light-headed. Feel nauseous. See all white or all black in your field of vision, or see spots. Have cold, clammy skin. This condition is caused by a sudden decrease in blood flow to the brain. This decrease can result from various causes, but most of those causes are not dangerous. However, near-syncope may be a sign of a serious medical problem, soit is important to seek medical care. Follow these instructions at home: Medicines Take over-the-counter and prescription medicines only as told by your health care provider. If you are taking blood pressure or heart medicine, get up slowly and take several minutes to sit and then stand. This can reduce dizziness. General instructions Pay attention to any changes in your symptoms. Talk with your health care provider about your symptoms. You may need to have testing to understand the cause of your near-syncope. If you start to feel like you might faint, lie down right away and raise (elevate) your feet above the level of your heart. Breathe deeply and steadily. Wait until all of the symptoms have passed. Have someone stay with you until you feel stable. Do not drive, use machinery, or play sports until your health care provider says it is okay. Drink enough fluid to keep your urine pale yellow. Keep all follow-up visits as told by your health care provider. This is important. Get help right away if you: Have a seizure. Have unusual pain in your chest, abdomen, or back. Faint once or repeatedly. Have a severe headache. Are  bleeding from your mouth or rectum, or you have black or tarry stool. Have a very fast or irregular heartbeat (palpitations). Are confused. Have trouble walking. Have severe weakness. Have vision problems. These symptoms may represent a serious problem that is an emergency. Do not wait to see if your symptoms will go away. Get medical help right away. Call your local emergency services (911 in the [place] Do not drive yourself to the hospital. Summary Near-syncope is when you suddenly feel like you might pass out (faint), but you do not actually lose consciousness. This condition is caused by a sudden decrease in blood flow to the brain. This decrease can result from various causes, but most of those causes are not dangerous. Near-syncope may be a sign of a serious medical problem, so it is important to seek medical care. This information is not intended to replace advice given to you by your health care provider. Make sure you discuss any questions you have with your healthcare provider. Document Revised: 01/26/2019 Document Reviewed: 08/23/2018 Elsevier Patient Education  2022 [employer].

## 2021-05-08 NOTE — Telephone Encounter (Signed)
Pt aware and will pick up Monday morning

## 2021-05-13 ENCOUNTER — Encounter: Payer: Self-pay | Admitting: Lactation Services

## 2021-05-13 ENCOUNTER — Other Ambulatory Visit: Payer: Self-pay | Admitting: Lactation Services

## 2021-05-13 MED ORDER — NORETHINDRONE 0.35 MG PO TABS: 1.0000 | ORAL_TABLET | Freq: Every day | ORAL | 2 refills | Status: DC

## 2021-05-13 NOTE — Progress Notes (Signed)
Reordered Micronor for 3 months. My Chart message sent to patient that she needs to to make an annual exam appointment prior to further refills.

## 2021-05-14 ENCOUNTER — Ambulatory Visit: Payer: Medicaid Other | Admitting: Nurse Practitioner

## 2021-05-18 ENCOUNTER — Ambulatory Visit: Payer: Medicaid Other | Admitting: Nurse Practitioner

## 2021-07-08 ENCOUNTER — Encounter: Payer: Medicaid Other | Admitting: Nurse Practitioner

## 2021-07-15 ENCOUNTER — Encounter: Payer: Medicaid Other | Admitting: Nurse Practitioner

## 2021-07-15 ENCOUNTER — Encounter: Payer: Self-pay | Admitting: Nurse Practitioner

## 2021-08-20 ENCOUNTER — Encounter: Payer: Self-pay | Admitting: Nurse Practitioner

## 2021-08-20 ENCOUNTER — Other Ambulatory Visit (HOSPITAL_COMMUNITY)
Admission: RE | Admit: 2021-08-20 | Discharge: 2021-08-20 | Disposition: A | Discharge status: Home / Self Care | Payer: Medicaid Other | Source: Ambulatory Visit | Attending: Nurse Practitioner | Admitting: Nurse Practitioner

## 2021-08-20 ENCOUNTER — Ambulatory Visit (INDEPENDENT_AMBULATORY_CARE_PROVIDER_SITE_OTHER): Payer: Medicaid Other | Admitting: Nurse Practitioner

## 2021-08-20 ENCOUNTER — Other Ambulatory Visit: Payer: Self-pay

## 2021-08-20 VITALS — BP 110/70 | HR 100 | Temp 97.2°F | Ht 64.0 in | Wt 261.6 lb

## 2021-08-20 DIAGNOSIS — Z0001 Encounter for general adult medical examination with abnormal findings: Secondary | ICD-10-CM

## 2021-08-20 DIAGNOSIS — E559 Vitamin D deficiency, unspecified: Secondary | ICD-10-CM

## 2021-08-20 DIAGNOSIS — R739 Hyperglycemia, unspecified: Secondary | ICD-10-CM

## 2021-08-20 DIAGNOSIS — Z30011 Encounter for initial prescription of contraceptive pills: Secondary | ICD-10-CM | POA: Diagnosis not present

## 2021-08-20 DIAGNOSIS — Z113 Encounter for screening for infections with a predominantly sexual mode of transmission: Secondary | ICD-10-CM | POA: Insufficient documentation

## 2021-08-20 DIAGNOSIS — Z23 Encounter for immunization: Secondary | ICD-10-CM | POA: Diagnosis not present

## 2021-08-20 LAB — LIPID PANEL
Cholesterol: 179 mg/dL (ref 0–200)
HDL: 51 mg/dL (ref >39.00)
LDL Cholesterol: 98 mg/dL (ref 0–99)
NonHDL: 128.22
Total CHOL/HDL Ratio: 4
Triglycerides: 149 mg/dL (ref 0.0–149.0)
VLDL: 29.8 mg/dL (ref 0.0–40.0)

## 2021-08-20 LAB — COMPREHENSIVE METABOLIC PANEL
ALT: 8 U/L (ref 0–35)
AST: 10 U/L (ref 0–37)
Albumin: 4.2 g/dL (ref 3.5–5.2)
Alkaline Phosphatase: 45 U/L (ref 39–117)
BUN: 11 mg/dL (ref 6–23)
CO2: 25 mEq/L (ref 19–32)
Calcium: 9.1 mg/dL (ref 8.4–10.5)
Chloride: 106 mEq/L (ref 96–112)
Creatinine, Ser: 0.74 mg/dL (ref 0.40–1.20)
GFR: 107.3 mL/min (ref >60.00)
Glucose, Bld: 79 mg/dL (ref 70–99)
Potassium: 3.8 mEq/L (ref 3.5–5.1)
Sodium: 139 mEq/L (ref 135–145)
Total Bilirubin: 0.4 mg/dL (ref 0.2–1.2)
Total Protein: 7.6 g/dL (ref 6.0–8.3)

## 2021-08-20 LAB — HEMOGLOBIN A1C: Hgb A1c MFr Bld: 5.9 % (ref 4.6–6.5)

## 2021-08-20 LAB — VITAMIN D 25 HYDROXY (VIT D DEFICIENCY, FRACTURES): VITD: 17.03 ng/mL — ABNORMAL LOW (ref 30.00–100.00)

## 2021-08-20 LAB — TSH: TSH: 1.2 u[IU]/mL (ref 0.35–5.50)

## 2021-08-20 NOTE — Patient Instructions (Addendum)
Go to lab for blood draw and urine collection.  Calorie Counting for Weight Loss Calories are units of energy. Your body needs a certain number of calories from food to keep going throughout the day. When you eat or drink more calories than your body needs, your body stores the extra calories mostly as fat. When you eat or drink fewer calories than your body needs, your body burns fat to get the energy it needs. Calorie counting means keeping track of how many calories you eat and drink each day. Calorie counting can be helpful if you need to lose weight. If you eat fewer calories than your body needs, you should lose weight. Ask your health care provider what a healthy weight is for you. For calorie counting to work, you will need to eat the right number of calories each day to lose a healthy amount of weight per week. A dietitian can help you figure out how many calories you need in a day and will suggest ways to reach your calorie goal. A healthy amount of weight to lose each week is usually 1-2 lb (0.5-0.9 kg). This usually means that your daily calorie intake should be reduced by 500-750 calories. Eating 1,200-1,500 calories a day can help most women lose weight. Eating 1,500-1,800 calories a day can help most men lose weight. What do I need to know about calorie counting? Work with your health care provider or dietitian to determine how many calories you should get each day. To meet your daily calorie goal, you will need to: Find out how many calories are in each food that you would like to eat. Try to do this before you eat. Decide how much of the food you plan to eat. Keep a food log. Do this by writing down what you ate and how many calories it had. To successfully lose weight, it is important to balance calorie counting with a healthy lifestyle that includes regular activity. Where do I find calorie information? The number of calories in a food can be found on a Nutrition Facts label. If a  food does not have a Nutrition Facts label, try to look up the calories online or ask your dietitian for help. Remember that calories are listed per serving. If you choose to have more than one serving of a food, you will have to multiply the calories per serving by the number of servings you plan to eat. For example, the label on a package of bread might say that a serving size is 1 slice and that there are 90 calories in a serving. If you eat 1 slice, you will have eaten 90 calories. If you eat 2 slices, you will have eaten 180 calories. How do I keep a food log? After each time that you eat, record the following in your food log as soon as possible: What you ate. Be sure to include toppings, sauces, and other extras on the food. How much you ate. This can be measured in cups, ounces, or number of items. How many calories were in each food and drink. The total number of calories in the food you ate. Keep your food log near you, such as in a pocket-sized notebook or on an app or website on your mobile phone. Some programs will calculate calories for you and show you how many calories you have left to meet your daily goal. What are some portion-control tips? Know how many calories are in a serving. This will help you know  how many servings you can have of a certain food. Use a measuring cup to measure serving sizes. You could also try weighing out portions on a kitchen scale. With time, you will be able to estimate serving sizes for some foods. Take time to put servings of different foods on your favorite plates or in your favorite bowls and cups so you know what a serving looks like. Try not to eat straight from a food's packaging, such as from a bag or box. Eating straight from the package makes it hard to see how much you are eating and can lead to overeating. Put the amount you would like to eat in a cup or on a plate to make sure you are eating the right portion. Use smaller plates, glasses, and  bowls for smaller portions and to prevent overeating. Try not to multitask. For example, avoid watching TV or using your computer while eating. If it is time to eat, sit down at a table and enjoy your food. This will help you recognize when you are full. It will also help you be more mindful of what and how much you are eating. What are tips for following this plan? Reading food labels Check the calorie count compared with the serving size. The serving size may be smaller than what you are used to eating. Check the source of the calories. Try to choose foods that are high in protein, fiber, and vitamins, and low in saturated fat, trans fat, and sodium. Shopping Read nutrition labels while you shop. This will help you make healthy decisions about which foods to buy. Pay attention to nutrition labels for low-fat or fat-free foods. These foods sometimes have the same number of calories or more calories than the full-fat versions. They also often have added sugar, starch, or salt to make up for flavor that was removed with the fat. Make a grocery list of lower-calorie foods and stick to it. Cooking Try to cook your favorite foods in a healthier way. For example, try baking instead of frying. Use low-fat dairy products. Meal planning Use more fruits and vegetables. One-half of your plate should be fruits and vegetables. Include lean proteins, such as chicken, [place], and fish. Lifestyle Each week, aim to do one of the following: 150 minutes of moderate exercise, such as walking. 75 minutes of vigorous exercise, such as running. General information Know how many calories are in the foods you eat most often. This will help you calculate calorie counts faster. Find a way of tracking calories that works for you. Get creative. Try different apps or programs if writing down calories does not work for you. What foods should I eat?  Eat nutritious foods. It is better to have a nutritious, high-calorie  food, such as an avocado, than a food with few nutrients, such as a bag of potato chips. Use your calories on foods and drinks that will fill you up and will not leave you hungry soon after eating. Examples of foods that fill you up are nuts and nut butters, vegetables, lean proteins, and high-fiber foods such as whole grains. High-fiber foods are foods with more than 5 g of fiber per serving. Pay attention to calories in drinks. Low-calorie drinks include water and unsweetened drinks. The items listed above may not be a complete list of foods and beverages you can eat. Contact a dietitian for more information. What foods should I limit? Limit foods or drinks that are not good sources of vitamins, minerals, or  protein or that are high in unhealthy fats. These include: Candy. Other sweets. Sodas, specialty coffee drinks, alcohol, and juice. The items listed above may not be a complete list of foods and beverages you should avoid. Contact a dietitian for more information. How do I count calories when eating out? Pay attention to portions. Often, portions are much larger when eating out. Try these tips to keep portions smaller: Consider sharing a meal instead of getting your own. If you get your own meal, eat only half of it. Before you start eating, ask for a container and put half of your meal into it. When available, consider ordering smaller portions from the menu instead of full portions. Pay attention to your food and drink choices. Knowing the way food is cooked and what is included with the meal can help you eat fewer calories. If calories are listed on the menu, choose the lower-calorie options. Choose dishes that include vegetables, fruits, whole grains, low-fat dairy products, and lean proteins. Choose items that are boiled, broiled, grilled, or steamed. Avoid items that are buttered, battered, fried, or served with cream sauce. Items labeled as crispy are usually fried, unless stated  otherwise. Choose water, low-fat milk, unsweetened iced tea, or other drinks without added sugar. If you want an alcoholic beverage, choose a lower-calorie option, such as a glass of wine or light beer. Ask for dressings, sauces, and syrups on the side. These are usually high in calories, so you should limit the amount you eat. If you want a salad, choose a garden salad and ask for grilled meats. Avoid extra toppings such as bacon, cheese, or fried items. Ask for the dressing on the side, or ask for olive oil and vinegar or lemon to use as dressing. Estimate how many servings of a food you are given. Knowing serving sizes will help you be aware of how much food you are eating at restaurants. Where to find more information Centers for Disease Control and Prevention: http://www.wolf.info/ [place] Department of Agriculture: http://www.wilson-mendoza.org/ Summary Calorie counting means keeping track of how many calories you eat and drink each day. If you eat fewer calories than your body needs, you should lose weight. A healthy amount of weight to lose per week is usually 1-2 lb (0.5-0.9 kg). This usually means reducing your daily calorie intake by 500-750 calories. The number of calories in a food can be found on a Nutrition Facts label. If a food does not have a Nutrition Facts label, try to look up the calories online or ask your dietitian for help. Use smaller plates, glasses, and bowls for smaller portions and to prevent overeating. Use your calories on foods and drinks that will fill you up and not leave you hungry shortly after a meal. This information is not intended to replace advice given to you by your health care provider. Make sure you discuss any questions you have with your health care provider. Document Revised: 11/15/2019 Document Reviewed: 11/15/2019 Elsevier Patient Education  2022 [place].  Exercising to [employer] Getting regular exercise is important for everyone. It is especially important if you are  overweight. Being overweight increases your risk of heart disease, stroke, diabetes, high blood pressure, and several types of cancer. Exercising, and reducing the calories you consume, can help you lose weight and improve fitness and health. Exercise can be moderate or vigorous intensity. To lose weight, most people need to do a certain amount of moderate or vigorous-intensity exercise each week. How can exercise affect  me? You lose weight when you exercise enough to burn more calories than you eat. Exercise also reduces body fat and builds muscle. The more muscle you have, the more calories you burn. Exercise also: Improves mood. Reduces stress and tension. Improves your overall fitness, flexibility, and endurance. Increases bone strength. Moderate-intensity exercise Moderate-intensity exercise is any activity that gets you moving enough to burn at least three times more energy (calories) than if you were sitting. Examples of moderate exercise include: Walking a mile in 15 minutes. Doing light yard work. Biking at an easy pace. Most people should get at least 150 minutes of moderate-intensity exercise a week to maintain their body weight. Vigorous-intensity exercise Vigorous-intensity exercise is any activity that gets you moving enough to burn at least six times more calories than if you were sitting. When you exercise at this intensity, you should be working hard enough that you are not able to carry on a conversation. Examples of vigorous exercise include: Running. Playing a team sport, such as football, basketball, and soccer. Jumping rope. Most people should get at least 75 minutes a week of vigorous exercise to maintain their body weight. What actions can I take to lose weight? The amount of exercise you need to lose weight depends on: Your age. The type of exercise. Any health conditions you have. Your overall physical ability. Talk to your health care provider about how much  exercise you need and what types of activities are safe for you. Nutrition  Make changes to your diet as told by your health care provider or diet and nutrition specialist (dietitian). This may include: Eating fewer calories. Eating more protein. Eating less unhealthy fats. Eating a diet that includes fresh fruits and vegetables, whole grains, low-fat dairy products, and lean protein. Avoiding foods with added fat, salt, and sugar. Drink plenty of water while you exercise to prevent dehydration or heat stroke. Activity Choose an activity that you enjoy and set realistic goals. Your health care provider can help you make an exercise plan that works for you. Exercise at a moderate or vigorous intensity most days of the week. The intensity of exercise may vary from person to person. You can tell how intense a workout is for you by paying attention to your breathing and heartbeat. Most people will notice their breathing and heartbeat get faster with more intense exercise. Do resistance training twice each week, such as: Push-ups. Sit-ups. Lifting weights. Using resistance bands. Getting short amounts of exercise can be just as helpful as long, structured periods of exercise. If you have trouble finding time to exercise, try doing these things as part of your daily routine: Get up, stretch, and walk around every 30 minutes throughout the day. Go for a walk during your lunch break. Park your car farther away from your destination. If you take public transportation, get off one stop early and walk the rest of the way. Make phone calls while standing up and walking around. Take the stairs instead of elevators or escalators. Wear comfortable clothes and shoes with good support. Do not exercise so much that you hurt yourself, feel dizzy, or get very short of breath. Where to find more information [place] Department of Health and Human Services: [employer] Centers for Disease Control and Prevention:  http://www.wolf.info/ Contact a health care provider: Before starting a new exercise program. If you have questions or concerns about your weight. If you have a medical problem that keeps you from exercising. Get help right away if: You have  any of the following while exercising: Injury. Dizziness. Difficulty breathing or shortness of breath that does not go away when you stop exercising. Chest pain. Rapid heartbeat. These symptoms may represent a serious problem that is an emergency. Do not wait to see if the symptoms will go away. Get medical help right away. Call your local emergency services (911 in the [place] Do not drive yourself to the hospital. Summary Getting regular exercise is especially important if you are overweight. Being overweight increases your risk of heart disease, stroke, diabetes, high blood pressure, and several types of cancer. Losing weight happens when you burn more calories than you eat. Reducing the amount of calories you eat, and getting regular moderate or vigorous exercise each week, helps you lose weight. This information is not intended to replace advice given to you by your health care provider. Make sure you discuss any questions you have with your health care provider. Document Revised: 11/30/2020 Document Reviewed: 11/30/2020 Elsevier Patient Education  2022 [employer].

## 2021-08-20 NOTE — Assessment & Plan Note (Signed)
We discussed ways to increase daily exercise and implement heart healthy diet. Provided printed information. We discussed possible complication from Obesity and sedentary lifestyle. She agreed to start walking 33mns daily and decrease soda/juice intake to 1can per week.

## 2021-08-20 NOTE — Assessment & Plan Note (Addendum)
Repeat vit. D: sent vitamin D 50000IU weekly x 8weeks, then switch to 5000IU daily from over the counter.

## 2021-08-20 NOTE — Assessment & Plan Note (Addendum)
Wants to resume [place] [facility]. She has not been sexually active x 1year. Has regular menstrual cycles. Up to date with PAP smear, last completed 2021 No tobacco use No FHx of PE/DVT.  Complete urine pregnancy
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
## 2021-08-21 LAB — URINE CYTOLOGY ANCILLARY ONLY
Chlamydia: NEGATIVE
Comment: NEGATIVE
Comment: NEGATIVE
Comment: NORMAL
Neisseria Gonorrhea: NEGATIVE
Trichomonas: NEGATIVE

## 2021-08-21 LAB — RPR: RPR Ser Ql: NONREACTIVE

## 2021-08-21 LAB — HIV ANTIBODY (ROUTINE TESTING W REFLEX): HIV 1&2 Ab, 4th Generation: NONREACTIVE

## 2021-08-21 LAB — HEPATITIS C ANTIBODY
Hepatitis C Ab: NONREACTIVE
SIGNAL TO CUT-OFF: 0.05 (ref <1.00)

## 2021-08-23 DIAGNOSIS — R739 Hyperglycemia, unspecified: Secondary | ICD-10-CM | POA: Insufficient documentation

## 2021-08-23 MED ORDER — VITAMIN D (ERGOCALCIFEROL) 1.25 MG (50000 UNIT) PO CAPS: 50000.0000 [IU] | ORAL_CAPSULE | ORAL | 0 refills | Status: DC

## 2021-08-23 NOTE — Assessment & Plan Note (Signed)
hgbA1c at 5.9% indicates prediabetes. Maintain heart healthy diet and daily exercise as discussed.

## 2021-08-24 ENCOUNTER — Encounter: Payer: Self-pay | Admitting: Nurse Practitioner

## 2021-08-24 LAB — POCT URINE PREGNANCY: Preg Test, Ur: NEGATIVE

## 2021-08-24 MED ORDER — LEVONORGEST-ETH ESTRAD 91-DAY 0.15-0.03 &0.01 MG PO TABS: 1.0000 | ORAL_TABLET | Freq: Every day | ORAL | 4 refills | Status: AC

## 2022-01-07 ENCOUNTER — Encounter: Payer: Self-pay | Admitting: Nurse Practitioner

## 2022-04-30 ENCOUNTER — Other Ambulatory Visit: Payer: Self-pay | Admitting: Nurse Practitioner

## 2022-04-30 DIAGNOSIS — E876 Hypokalemia: Secondary | ICD-10-CM

## 2022-04-30 DIAGNOSIS — E559 Vitamin D deficiency, unspecified: Secondary | ICD-10-CM

## 2022-04-30 MED ORDER — POTASSIUM CHLORIDE ER 20 MEQ PO TBCR: 20.0000 meq | EXTENDED_RELEASE_TABLET | Freq: Two times a day (BID) | ORAL | 0 refills | Status: AC

## 2022-04-30 MED ORDER — VITAMIN D (ERGOCALCIFEROL) 1.25 MG (50000 UNIT) PO CAPS
50000.0000 [IU] | ORAL_CAPSULE | ORAL | 0 refills | Status: AC
Start: 2022-04-30 — End: ?

## 2022-04-30 NOTE — Telephone Encounter (Signed)
Chart supports Rx Last OV: 08/2021 Next OV: 08/2022

## 2022-08-20 ENCOUNTER — Encounter: Payer: Medicaid Other | Admitting: Nurse Practitioner

## 2022-08-20 ENCOUNTER — Telehealth: Payer: Self-pay | Admitting: Nurse Practitioner

## 2022-08-20 NOTE — Telephone Encounter (Signed)
No show history 08/20/2022 cpe, same day cancel, taking daughter to hospital 07/15/2021 cpe, same day cancel, onboarding for job took longer than expected 07/08/2021 cpe, same day cancel, no reason noted  Cannot charge no show fee due to pt having [employer]. Please advise if you would like me to send final warning letter.

## 2022-08-20 NOTE — Telephone Encounter (Signed)
Pt called today @ 10:12 to cancel her appointment. Pt stated she had to take her daughter to the hospital

## 2022-08-23 ENCOUNTER — Encounter: Payer: Self-pay | Admitting: Nurse Practitioner

## 2022-08-23 NOTE — Telephone Encounter (Signed)
Sent final warning vial mail and MyChart

## 2023-05-20 DIAGNOSIS — F3171 Bipolar disorder, in partial remission, most recent episode hypomanic: Secondary | ICD-10-CM | POA: Diagnosis not present

## 2023-05-20 DIAGNOSIS — F332 Major depressive disorder, recurrent severe without psychotic features: Secondary | ICD-10-CM | POA: Diagnosis not present

## 2023-05-20 DIAGNOSIS — F902 Attention-deficit hyperactivity disorder, combined type: Secondary | ICD-10-CM | POA: Diagnosis not present

## 2023-05-20 DIAGNOSIS — F411 Generalized anxiety disorder: Secondary | ICD-10-CM | POA: Diagnosis not present

## 2023-05-27 DIAGNOSIS — F411 Generalized anxiety disorder: Secondary | ICD-10-CM | POA: Diagnosis not present

## 2023-05-27 DIAGNOSIS — F902 Attention-deficit hyperactivity disorder, combined type: Secondary | ICD-10-CM | POA: Diagnosis not present

## 2023-05-27 DIAGNOSIS — F332 Major depressive disorder, recurrent severe without psychotic features: Secondary | ICD-10-CM | POA: Diagnosis not present

## 2023-05-27 DIAGNOSIS — F3171 Bipolar disorder, in partial remission, most recent episode hypomanic: Secondary | ICD-10-CM | POA: Diagnosis not present

## 2023-06-10 DIAGNOSIS — F3171 Bipolar disorder, in partial remission, most recent episode hypomanic: Secondary | ICD-10-CM | POA: Diagnosis not present

## 2023-06-10 DIAGNOSIS — F411 Generalized anxiety disorder: Secondary | ICD-10-CM | POA: Diagnosis not present

## 2023-06-10 DIAGNOSIS — F332 Major depressive disorder, recurrent severe without psychotic features: Secondary | ICD-10-CM | POA: Diagnosis not present

## 2023-06-10 DIAGNOSIS — F902 Attention-deficit hyperactivity disorder, combined type: Secondary | ICD-10-CM | POA: Diagnosis not present

## 2023-06-17 DIAGNOSIS — F332 Major depressive disorder, recurrent severe without psychotic features: Secondary | ICD-10-CM | POA: Diagnosis not present

## 2023-06-17 DIAGNOSIS — F3171 Bipolar disorder, in partial remission, most recent episode hypomanic: Secondary | ICD-10-CM | POA: Diagnosis not present

## 2023-06-17 DIAGNOSIS — F902 Attention-deficit hyperactivity disorder, combined type: Secondary | ICD-10-CM | POA: Diagnosis not present

## 2023-06-17 DIAGNOSIS — F411 Generalized anxiety disorder: Secondary | ICD-10-CM | POA: Diagnosis not present

## 2023-06-24 DIAGNOSIS — F3171 Bipolar disorder, in partial remission, most recent episode hypomanic: Secondary | ICD-10-CM | POA: Diagnosis not present

## 2023-06-24 DIAGNOSIS — F902 Attention-deficit hyperactivity disorder, combined type: Secondary | ICD-10-CM | POA: Diagnosis not present

## 2023-06-24 DIAGNOSIS — F411 Generalized anxiety disorder: Secondary | ICD-10-CM | POA: Diagnosis not present

## 2023-06-24 DIAGNOSIS — F332 Major depressive disorder, recurrent severe without psychotic features: Secondary | ICD-10-CM | POA: Diagnosis not present

## 2023-07-08 DIAGNOSIS — F411 Generalized anxiety disorder: Secondary | ICD-10-CM | POA: Diagnosis not present

## 2023-07-15 DIAGNOSIS — F411 Generalized anxiety disorder: Secondary | ICD-10-CM | POA: Diagnosis not present

## 2023-07-22 DIAGNOSIS — F411 Generalized anxiety disorder: Secondary | ICD-10-CM | POA: Diagnosis not present

## 2023-07-29 DIAGNOSIS — F332 Major depressive disorder, recurrent severe without psychotic features: Secondary | ICD-10-CM | POA: Diagnosis not present

## 2023-07-29 DIAGNOSIS — F411 Generalized anxiety disorder: Secondary | ICD-10-CM | POA: Diagnosis not present

## 2023-08-05 DIAGNOSIS — F332 Major depressive disorder, recurrent severe without psychotic features: Secondary | ICD-10-CM | POA: Diagnosis not present

## 2023-08-05 DIAGNOSIS — F411 Generalized anxiety disorder: Secondary | ICD-10-CM | POA: Diagnosis not present

## 2023-08-12 DIAGNOSIS — F411 Generalized anxiety disorder: Secondary | ICD-10-CM | POA: Diagnosis not present

## 2023-08-12 DIAGNOSIS — F332 Major depressive disorder, recurrent severe without psychotic features: Secondary | ICD-10-CM | POA: Diagnosis not present

## 2023-08-20 DIAGNOSIS — F411 Generalized anxiety disorder: Secondary | ICD-10-CM | POA: Diagnosis not present

## 2023-08-20 DIAGNOSIS — F332 Major depressive disorder, recurrent severe without psychotic features: Secondary | ICD-10-CM | POA: Diagnosis not present

## 2023-08-27 DIAGNOSIS — F411 Generalized anxiety disorder: Secondary | ICD-10-CM | POA: Diagnosis not present

## 2023-08-27 DIAGNOSIS — F332 Major depressive disorder, recurrent severe without psychotic features: Secondary | ICD-10-CM | POA: Diagnosis not present

## 2023-09-10 DIAGNOSIS — F411 Generalized anxiety disorder: Secondary | ICD-10-CM | POA: Diagnosis not present

## 2023-09-10 DIAGNOSIS — F332 Major depressive disorder, recurrent severe without psychotic features: Secondary | ICD-10-CM | POA: Diagnosis not present

## 2023-09-17 DIAGNOSIS — F411 Generalized anxiety disorder: Secondary | ICD-10-CM | POA: Diagnosis not present

## 2023-09-17 DIAGNOSIS — F332 Major depressive disorder, recurrent severe without psychotic features: Secondary | ICD-10-CM | POA: Diagnosis not present

## 2023-09-24 DIAGNOSIS — F411 Generalized anxiety disorder: Secondary | ICD-10-CM | POA: Diagnosis not present

## 2023-09-24 DIAGNOSIS — F332 Major depressive disorder, recurrent severe without psychotic features: Secondary | ICD-10-CM | POA: Diagnosis not present

## 2023-10-01 DIAGNOSIS — F411 Generalized anxiety disorder: Secondary | ICD-10-CM | POA: Diagnosis not present

## 2023-10-01 DIAGNOSIS — F332 Major depressive disorder, recurrent severe without psychotic features: Secondary | ICD-10-CM | POA: Diagnosis not present

## 2023-10-08 DIAGNOSIS — F411 Generalized anxiety disorder: Secondary | ICD-10-CM | POA: Diagnosis not present

## 2023-10-08 DIAGNOSIS — F332 Major depressive disorder, recurrent severe without psychotic features: Secondary | ICD-10-CM | POA: Diagnosis not present

## 2023-10-22 DIAGNOSIS — F411 Generalized anxiety disorder: Secondary | ICD-10-CM | POA: Diagnosis not present

## 2023-10-22 DIAGNOSIS — F332 Major depressive disorder, recurrent severe without psychotic features: Secondary | ICD-10-CM | POA: Diagnosis not present

## 2023-10-26 DIAGNOSIS — F332 Major depressive disorder, recurrent severe without psychotic features: Secondary | ICD-10-CM | POA: Diagnosis not present

## 2023-10-26 DIAGNOSIS — F411 Generalized anxiety disorder: Secondary | ICD-10-CM | POA: Diagnosis not present

## 2023-11-02 DIAGNOSIS — F332 Major depressive disorder, recurrent severe without psychotic features: Secondary | ICD-10-CM | POA: Diagnosis not present

## 2023-11-02 DIAGNOSIS — F411 Generalized anxiety disorder: Secondary | ICD-10-CM | POA: Diagnosis not present

## 2023-11-09 DIAGNOSIS — F332 Major depressive disorder, recurrent severe without psychotic features: Secondary | ICD-10-CM | POA: Diagnosis not present

## 2023-11-09 DIAGNOSIS — F411 Generalized anxiety disorder: Secondary | ICD-10-CM | POA: Diagnosis not present

## 2023-11-16 DIAGNOSIS — F332 Major depressive disorder, recurrent severe without psychotic features: Secondary | ICD-10-CM | POA: Diagnosis not present

## 2023-11-16 DIAGNOSIS — F411 Generalized anxiety disorder: Secondary | ICD-10-CM | POA: Diagnosis not present

## 2023-11-30 DIAGNOSIS — F411 Generalized anxiety disorder: Secondary | ICD-10-CM | POA: Diagnosis not present

## 2023-11-30 DIAGNOSIS — F332 Major depressive disorder, recurrent severe without psychotic features: Secondary | ICD-10-CM | POA: Diagnosis not present

## 2023-12-08 DIAGNOSIS — F411 Generalized anxiety disorder: Secondary | ICD-10-CM | POA: Diagnosis not present

## 2023-12-08 DIAGNOSIS — F332 Major depressive disorder, recurrent severe without psychotic features: Secondary | ICD-10-CM | POA: Diagnosis not present

## 2023-12-14 DIAGNOSIS — F411 Generalized anxiety disorder: Secondary | ICD-10-CM | POA: Diagnosis not present

## 2023-12-14 DIAGNOSIS — F332 Major depressive disorder, recurrent severe without psychotic features: Secondary | ICD-10-CM | POA: Diagnosis not present

## 2023-12-22 DIAGNOSIS — F411 Generalized anxiety disorder: Secondary | ICD-10-CM | POA: Diagnosis not present

## 2023-12-22 DIAGNOSIS — F332 Major depressive disorder, recurrent severe without psychotic features: Secondary | ICD-10-CM | POA: Diagnosis not present

## 2023-12-28 DIAGNOSIS — F332 Major depressive disorder, recurrent severe without psychotic features: Secondary | ICD-10-CM | POA: Diagnosis not present

## 2023-12-28 DIAGNOSIS — F411 Generalized anxiety disorder: Secondary | ICD-10-CM | POA: Diagnosis not present

## 2024-01-04 DIAGNOSIS — F411 Generalized anxiety disorder: Secondary | ICD-10-CM | POA: Diagnosis not present

## 2024-01-04 DIAGNOSIS — F332 Major depressive disorder, recurrent severe without psychotic features: Secondary | ICD-10-CM | POA: Diagnosis not present

## 2024-01-11 DIAGNOSIS — F332 Major depressive disorder, recurrent severe without psychotic features: Secondary | ICD-10-CM | POA: Diagnosis not present

## 2024-01-11 DIAGNOSIS — F411 Generalized anxiety disorder: Secondary | ICD-10-CM | POA: Diagnosis not present

## 2024-01-18 DIAGNOSIS — F332 Major depressive disorder, recurrent severe without psychotic features: Secondary | ICD-10-CM | POA: Diagnosis not present

## 2024-01-18 DIAGNOSIS — F411 Generalized anxiety disorder: Secondary | ICD-10-CM | POA: Diagnosis not present

## 2024-01-25 ENCOUNTER — Ambulatory Visit: Admitting: Nurse Practitioner

## 2024-01-27 DIAGNOSIS — F332 Major depressive disorder, recurrent severe without psychotic features: Secondary | ICD-10-CM | POA: Diagnosis not present

## 2024-01-27 DIAGNOSIS — F411 Generalized anxiety disorder: Secondary | ICD-10-CM | POA: Diagnosis not present

## 2024-02-08 DIAGNOSIS — F332 Major depressive disorder, recurrent severe without psychotic features: Secondary | ICD-10-CM | POA: Diagnosis not present

## 2024-02-08 DIAGNOSIS — F411 Generalized anxiety disorder: Secondary | ICD-10-CM | POA: Diagnosis not present

## 2024-02-20 ENCOUNTER — Ambulatory Visit: Admitting: Nurse Practitioner

## 2024-02-21 ENCOUNTER — Telehealth: Payer: Self-pay | Admitting: Nurse Practitioner

## 2024-02-21 NOTE — Telephone Encounter (Signed)
 02/20/2024 pt same day cancel via mychart noting family ER, 1st missed visit, letter sent via mychart

## 2024-02-22 ENCOUNTER — Ambulatory Visit: Admitting: Nurse Practitioner

## 2024-02-22 DIAGNOSIS — F411 Generalized anxiety disorder: Secondary | ICD-10-CM | POA: Diagnosis not present

## 2024-02-22 DIAGNOSIS — F332 Major depressive disorder, recurrent severe without psychotic features: Secondary | ICD-10-CM | POA: Diagnosis not present

## 2024-03-07 DIAGNOSIS — F411 Generalized anxiety disorder: Secondary | ICD-10-CM | POA: Diagnosis not present

## 2024-03-07 DIAGNOSIS — F332 Major depressive disorder, recurrent severe without psychotic features: Secondary | ICD-10-CM | POA: Diagnosis not present

## 2024-03-23 ENCOUNTER — Ambulatory Visit: Admitting: Nurse Practitioner

## 2024-04-11 DIAGNOSIS — F411 Generalized anxiety disorder: Secondary | ICD-10-CM | POA: Diagnosis not present

## 2024-04-11 DIAGNOSIS — F332 Major depressive disorder, recurrent severe without psychotic features: Secondary | ICD-10-CM | POA: Diagnosis not present

## 2024-04-19 DIAGNOSIS — F411 Generalized anxiety disorder: Secondary | ICD-10-CM | POA: Diagnosis not present

## 2024-04-19 DIAGNOSIS — F332 Major depressive disorder, recurrent severe without psychotic features: Secondary | ICD-10-CM | POA: Diagnosis not present

## 2024-04-26 DIAGNOSIS — F332 Major depressive disorder, recurrent severe without psychotic features: Secondary | ICD-10-CM | POA: Diagnosis not present

## 2024-04-26 DIAGNOSIS — F411 Generalized anxiety disorder: Secondary | ICD-10-CM | POA: Diagnosis not present

## 2024-05-10 DIAGNOSIS — F411 Generalized anxiety disorder: Secondary | ICD-10-CM | POA: Diagnosis not present

## 2024-05-10 DIAGNOSIS — F332 Major depressive disorder, recurrent severe without psychotic features: Secondary | ICD-10-CM | POA: Diagnosis not present

## 2024-05-16 ENCOUNTER — Encounter: Admitting: Nurse Practitioner

## 2024-05-25 DIAGNOSIS — F411 Generalized anxiety disorder: Secondary | ICD-10-CM | POA: Diagnosis not present

## 2024-05-25 DIAGNOSIS — F332 Major depressive disorder, recurrent severe without psychotic features: Secondary | ICD-10-CM | POA: Diagnosis not present

## 2024-05-31 DIAGNOSIS — F411 Generalized anxiety disorder: Secondary | ICD-10-CM | POA: Diagnosis not present

## 2024-05-31 DIAGNOSIS — F332 Major depressive disorder, recurrent severe without psychotic features: Secondary | ICD-10-CM | POA: Diagnosis not present

## 2024-06-07 DIAGNOSIS — F332 Major depressive disorder, recurrent severe without psychotic features: Secondary | ICD-10-CM | POA: Diagnosis not present

## 2024-06-07 DIAGNOSIS — F411 Generalized anxiety disorder: Secondary | ICD-10-CM | POA: Diagnosis not present

## 2024-06-14 ENCOUNTER — Other Ambulatory Visit: Payer: Self-pay

## 2024-06-14 ENCOUNTER — Emergency Department (HOSPITAL_COMMUNITY)
Admission: EM | Admit: 2024-06-14 | Discharge: 2024-06-14 | Disposition: A | Discharge status: Home / Self Care | Attending: Emergency Medicine | Admitting: Emergency Medicine

## 2024-06-14 ENCOUNTER — Encounter (HOSPITAL_COMMUNITY): Payer: Self-pay

## 2024-06-14 ENCOUNTER — Emergency Department (HOSPITAL_COMMUNITY)

## 2024-06-14 DIAGNOSIS — Z7951 Long term (current) use of inhaled steroids: Secondary | ICD-10-CM | POA: Insufficient documentation

## 2024-06-14 DIAGNOSIS — R0789 Other chest pain: Secondary | ICD-10-CM | POA: Diagnosis not present

## 2024-06-14 DIAGNOSIS — R079 Chest pain, unspecified: Secondary | ICD-10-CM | POA: Diagnosis not present

## 2024-06-14 DIAGNOSIS — J45909 Unspecified asthma, uncomplicated: Secondary | ICD-10-CM | POA: Insufficient documentation

## 2024-06-14 DIAGNOSIS — N644 Mastodynia: Secondary | ICD-10-CM | POA: Diagnosis not present

## 2024-06-14 LAB — CBC
HCT: 34.6 % — ABNORMAL LOW (ref 36.0–46.0)
Hemoglobin: 11.2 g/dL — ABNORMAL LOW (ref 12.0–15.0)
MCH: 28 pg (ref 26.0–34.0)
MCHC: 32.4 g/dL (ref 30.0–36.0)
MCV: 86.5 fL (ref 80.0–100.0)
Platelets: 434 K/uL — ABNORMAL HIGH (ref 150–400)
RBC: 4 MIL/uL (ref 3.87–5.11)
RDW: 15.9 % — ABNORMAL HIGH (ref 11.5–15.5)
WBC: 8.6 K/uL (ref 4.0–10.5)
nRBC: 0 % (ref 0.0–0.2)

## 2024-06-14 LAB — BASIC METABOLIC PANEL WITH GFR
Anion gap: 9 (ref 5–15)
BUN: 7 mg/dL (ref 6–20)
CO2: 21 mmol/L — ABNORMAL LOW (ref 22–32)
Calcium: 8.7 mg/dL — ABNORMAL LOW (ref 8.9–10.3)
Chloride: 107 mmol/L (ref 98–111)
Creatinine, Ser: 0.91 mg/dL (ref 0.44–1.00)
GFR, Estimated: >60 mL/min (ref >60)
Glucose, Bld: 72 mg/dL (ref 70–99)
Potassium: 4.3 mmol/L (ref 3.5–5.1)
Sodium: 137 mmol/L (ref 135–145)

## 2024-06-14 LAB — TROPONIN I (HIGH SENSITIVITY)
Troponin I (High Sensitivity): <2 ng/L (ref <18)
Troponin I (High Sensitivity): 2 ng/L (ref <18)

## 2024-06-14 MED ORDER — CYCLOBENZAPRINE HCL 10 MG PO TABS: 10.0000 mg | ORAL_TABLET | Freq: Two times a day (BID) | ORAL | 0 refills | Status: DC | PRN

## 2024-06-14 MED ORDER — KETOROLAC TROMETHAMINE 15 MG/ML IJ SOLN
15.0000 mg | Freq: Once | INTRAMUSCULAR | Status: AC
  Administered 2024-06-14: 15 mg via INTRAVENOUS
  Filled 2024-06-14: qty 1

## 2024-06-14 MED ORDER — LIDOCAINE 5 % EX PTCH
1.0000 | MEDICATED_PATCH | CUTANEOUS | Status: DC
  Administered 2024-06-14: 1 via TRANSDERMAL
  Filled 2024-06-14: qty 1

## 2024-06-14 NOTE — ED Notes (Signed)
 Light green redraw sent down with second trop.

## 2024-06-14 NOTE — ED Provider Notes (Signed)
 [place] EMERGENCY DEPARTMENT AT [facility] Provider Note   CSN: [id number] Arrival date & time: 06/14/24  1147     Patient presents with: Chest Pain   Sydney Deleon is a 33 y.o. female.  {Add pertinent medical, surgical, social history, OB history to HPI:2330} 33 year old female with a history of asthma and anxiety presents emergency department left-sided chest pain.  Patient reports that yesterday she started experiencing left-sided chest discomfort.  It is a pressure-like sensation.  It is positional and worse with laying down.  Also thinks that it is pleuritic and exertional.  Says that it feels like her breast being taken away when she tries to take a deep breath.  No lower extremity swelling.  No diaphoresis or vomiting.  Called 911 today and was given aspirin prior to arrival.  No history of DVT or PE, cancer, surgery in the past month, OCP or hormone use.  No recent travels.       Prior to Admission medications   Medication Sig Start Date End Date Taking? Authorizing Provider  albuterol  (PROVENTIL  HFA;VENTOLIN  HFA) 108 (90 Base) MCG/ACT inhaler Inhale 2 puffs into the lungs every 4 (four) hours as needed for wheezing or shortness of breath. 10/09/18   Freddi Hamilton, MD  calcium-vitamin D  (OSCAL WITH D) 500-200 MG-UNIT TABS tablet Take 1 tablet by mouth daily.    [provider]  Cholecalciferol  (VITAMIN D ) 50 MCG (2000 UT) tablet Take 2,000 Units by mouth daily.    [provider]  citalopram  (CELEXA ) 40 MG tablet Take 1 tablet (40 mg total) by mouth daily. 08/27/19   Vincente Murrain, MD  Ipratropium-Albuterol  (COMBIVENT) 20-100 MCG/ACT AERS respimat Inhale 1 puff into the lungs every 6 (six) hours. 05/07/21   Nche, Roselie Rockford, NP  Levonorgestrel-Ethinyl Estradiol (AMETHIA) 0.15-0.03 &0.01 MG tablet Take 1 tablet by mouth daily. 08/24/21   Nche, Roselie Rockford, NP  Multiple Vitamin (MULTI-VITAMIN) tablet Take 1 tablet by mouth daily.     [provider]  Potassium Chloride  ER 20 MEQ TBCR Take 20 mEq by mouth 2 (two) times daily. 04/30/22   Nche, Roselie Rockford, NP  Vitamin D , Ergocalciferol , (DRISDOL ) 1.25 MG (50000 UNIT) CAPS capsule Take 1 capsule (50,000 Units total) by mouth every 7 (seven) days. 04/30/22   Nche, Roselie Rockford, NP    Allergies: Prednisone     Review of Systems  Updated Vital Signs BP 103/71 (BP Location: Right Arm)   Pulse 84   Temp 99 F (37.2 C) (Oral)   Resp 18   SpO2 100%   Physical Exam Vitals and nursing note reviewed.  Constitutional:      General: She is not in acute distress.    Appearance: She is well-developed.  HENT:     Head: Normocephalic and atraumatic.     Right Ear: External ear normal.     Left Ear: External ear normal.     Nose: Nose normal.  Eyes:     Extraocular Movements: Extraocular movements intact.     Conjunctiva/sclera: Conjunctivae normal.     Pupils: Pupils are equal, round, and reactive to light.  Cardiovascular:     Rate and Rhythm: Normal rate and regular rhythm.     Heart sounds: No murmur heard.    Comments: Radial pulses 2+ bilaterally.  Chest pain is reproducible.  No overlying rash on the chest Pulmonary:     Effort: Pulmonary effort is normal. No respiratory distress.     Breath sounds: Normal breath sounds.  Musculoskeletal:     Cervical back: Normal range of motion and neck supple.     Right lower leg: No edema.     Left lower leg: No edema.  Skin:    General: Skin is warm and dry.  Neurological:     Mental Status: She is alert and oriented to person, place, and time. Mental status is at baseline.  Psychiatric:        Mood and Affect: Mood normal.     (all labs ordered are listed, but only abnormal results are displayed) Labs Reviewed - No data to display  EKG: None  Radiology: No results found.  {Document cardiac monitor, telemetry assessment procedure when appropriate:32947} Procedures   Medications Ordered in the ED -  No data to display    {Click here for ABCD2, HEART and other calculators REFRESH Note before signing:1}                              Medical Decision Making Amount and/or Complexity of Data Reviewed Labs: ordered. Radiology: ordered.  Risk Prescription drug management.   ***  {Document critical care time when appropriate  Document review of labs and clinical decision tools ie CHADS2VASC2, etc  Document your independent review of radiology images and any outside records  Document your discussion with family members, caretakers and with consultants  Document social determinants of health affecting pt's care  Document your decision making why or why not admission, treatments were needed:32947:::1}   Final diagnoses:  None    ED Discharge Orders     None

## 2024-06-14 NOTE — Discharge Instructions (Signed)
 You were seen for your chest pain in the emergency department. It is likely from a pulled muscle  At home, please try Tylenol  and ibuprofen  for the pain. You may also take the cyclobenzaprine  we have prescribed you for any breakthrough pain that may have.  Do not take this before driving or operating heavy machinery.  Do not take this medication with alcohol.    Check your MyChart online for the results of any tests that had not resulted by the time you left the emergency department.   Follow-up with your primary doctor in 2-3 days regarding your visit.    Return immediately to the emergency department if you experience any of the following: Worsening pain, difficulty breathing, or any other concerning symptoms.    Thank you for visiting our Emergency Department. It was a pleasure taking care of you today.

## 2024-06-14 NOTE — ED Triage Notes (Signed)
 Patient BIB EMS from c/o non-cardiac chest pain that has been going on for 2 days. No cardiac history. 324 of aspirin given prior to EMS arrival.

## 2024-06-14 NOTE — ED Notes (Signed)
 Patient transported to X-ray

## 2024-06-21 DIAGNOSIS — F411 Generalized anxiety disorder: Secondary | ICD-10-CM | POA: Diagnosis not present

## 2024-06-21 DIAGNOSIS — F332 Major depressive disorder, recurrent severe without psychotic features: Secondary | ICD-10-CM | POA: Diagnosis not present

## 2024-06-26 ENCOUNTER — Encounter: Admitting: Nurse Practitioner

## 2024-06-28 DIAGNOSIS — F332 Major depressive disorder, recurrent severe without psychotic features: Secondary | ICD-10-CM | POA: Diagnosis not present

## 2024-06-28 DIAGNOSIS — F411 Generalized anxiety disorder: Secondary | ICD-10-CM | POA: Diagnosis not present

## 2024-07-05 DIAGNOSIS — F411 Generalized anxiety disorder: Secondary | ICD-10-CM | POA: Diagnosis not present

## 2024-07-05 DIAGNOSIS — F332 Major depressive disorder, recurrent severe without psychotic features: Secondary | ICD-10-CM | POA: Diagnosis not present

## 2024-07-21 ENCOUNTER — Other Ambulatory Visit: Payer: Self-pay

## 2024-07-21 ENCOUNTER — Emergency Department (HOSPITAL_COMMUNITY)

## 2024-07-21 ENCOUNTER — Emergency Department (HOSPITAL_COMMUNITY)
Admission: EM | Admit: 2024-07-21 | Discharge: 2024-07-21 | Disposition: A | Discharge status: Home / Self Care | Attending: Emergency Medicine | Admitting: Emergency Medicine

## 2024-07-21 ENCOUNTER — Encounter (HOSPITAL_COMMUNITY): Payer: Self-pay | Admitting: *Deleted

## 2024-07-21 DIAGNOSIS — R531 Weakness: Secondary | ICD-10-CM | POA: Insufficient documentation

## 2024-07-21 DIAGNOSIS — R410 Disorientation, unspecified: Secondary | ICD-10-CM | POA: Diagnosis not present

## 2024-07-21 DIAGNOSIS — M79652 Pain in left thigh: Secondary | ICD-10-CM | POA: Diagnosis not present

## 2024-07-21 DIAGNOSIS — Y92000 Kitchen of unspecified non-institutional (private) residence as  the place of occurrence of the external cause: Secondary | ICD-10-CM | POA: Insufficient documentation

## 2024-07-21 DIAGNOSIS — W1839XA Other fall on same level, initial encounter: Secondary | ICD-10-CM | POA: Insufficient documentation

## 2024-07-21 DIAGNOSIS — E876 Hypokalemia: Secondary | ICD-10-CM | POA: Diagnosis not present

## 2024-07-21 DIAGNOSIS — M79651 Pain in right thigh: Secondary | ICD-10-CM | POA: Insufficient documentation

## 2024-07-21 DIAGNOSIS — R4182 Altered mental status, unspecified: Secondary | ICD-10-CM | POA: Diagnosis present

## 2024-07-21 LAB — COMPREHENSIVE METABOLIC PANEL WITH GFR
ALT: 16 U/L (ref 0–44)
AST: 18 U/L (ref 15–41)
Albumin: 3.4 g/dL — ABNORMAL LOW (ref 3.5–5.0)
Alkaline Phosphatase: 42 U/L (ref 38–126)
Anion gap: 10 (ref 5–15)
BUN: 7 mg/dL (ref 6–20)
CO2: 20 mmol/L — ABNORMAL LOW (ref 22–32)
Calcium: 8.7 mg/dL — ABNORMAL LOW (ref 8.9–10.3)
Chloride: 109 mmol/L (ref 98–111)
Creatinine, Ser: 0.87 mg/dL (ref 0.44–1.00)
GFR, Estimated: >60 mL/min (ref >60)
Glucose, Bld: 86 mg/dL (ref 70–99)
Potassium: 3.3 mmol/L — ABNORMAL LOW (ref 3.5–5.1)
Sodium: 139 mmol/L (ref 135–145)
Total Bilirubin: 0.6 mg/dL (ref 0.0–1.2)
Total Protein: 7.5 g/dL (ref 6.5–8.1)

## 2024-07-21 LAB — I-STAT CHEM 8, ED
BUN: 5 mg/dL — ABNORMAL LOW (ref 6–20)
Calcium, Ion: 1.13 mmol/L — ABNORMAL LOW (ref 1.15–1.40)
Chloride: 109 mmol/L (ref 98–111)
Creatinine, Ser: 0.9 mg/dL (ref 0.44–1.00)
Glucose, Bld: 85 mg/dL (ref 70–99)
HCT: 35 % — ABNORMAL LOW (ref 36.0–46.0)
Hemoglobin: 11.9 g/dL — ABNORMAL LOW (ref 12.0–15.0)
Potassium: 3.2 mmol/L — ABNORMAL LOW (ref 3.5–5.1)
Sodium: 141 mmol/L (ref 135–145)
TCO2: 21 mmol/L — ABNORMAL LOW (ref 22–32)

## 2024-07-21 LAB — CBC
HCT: 35.2 % — ABNORMAL LOW (ref 36.0–46.0)
Hemoglobin: 11.3 g/dL — ABNORMAL LOW (ref 12.0–15.0)
MCH: 28.2 pg (ref 26.0–34.0)
MCHC: 32.1 g/dL (ref 30.0–36.0)
MCV: 87.8 fL (ref 80.0–100.0)
Platelets: 422 K/uL — ABNORMAL HIGH (ref 150–400)
RBC: 4.01 MIL/uL (ref 3.87–5.11)
RDW: 15.2 % (ref 11.5–15.5)
WBC: 7.8 K/uL (ref 4.0–10.5)
nRBC: 0 % (ref 0.0–0.2)

## 2024-07-21 LAB — CK: Total CK: 163 U/L (ref 38–234)

## 2024-07-21 LAB — CBG MONITORING, ED: Glucose-Capillary: 88 mg/dL (ref 70–99)

## 2024-07-21 MED ORDER — POTASSIUM CHLORIDE CRYS ER 20 MEQ PO TBCR: 20.0000 meq | EXTENDED_RELEASE_TABLET | Freq: Two times a day (BID) | ORAL | 0 refills | Status: AC

## 2024-07-21 MED ORDER — POTASSIUM CHLORIDE CRYS ER 20 MEQ PO TBCR
40.0000 meq | EXTENDED_RELEASE_TABLET | Freq: Once | ORAL | Status: AC
  Administered 2024-07-21: 40 meq via ORAL
  Filled 2024-07-21: qty 2

## 2024-07-21 MED ORDER — LACTATED RINGERS IV BOLUS
1000.0000 mL | Freq: Once | INTRAVENOUS | Status: AC
Start: 2024-07-21 — End: 2024-07-21
  Administered 2024-07-21: 1000 mL via INTRAVENOUS

## 2024-07-21 NOTE — ED Triage Notes (Signed)
 Patient presents to the ED via [facility] , staes she was in the kitchen talking to her mother , mother states she just stopped talking and felll to the floor, patient doesn't remember event. C/o not feeling well for several days. Per ems she was on the floor when they arrived no urine  or bowel incont.  Ems states when they were getting her out of the ems truck at the hospital she became much more alert, states she had vomiting yest. Patient will raise her arms up however will not raise her legs. up

## 2024-07-21 NOTE — ED Provider Notes (Signed)
 [facility] AT [facility] Provider Note   CSN: [id number] Arrival date & time: 07/21/24  1124     Patient presents with: Extremity Weakness   Sydney Deleon is a 33 y.o. female.   74   33 year old female presents today with sudden onset of altered mental status.  She was in the kitchen when she fell to the ground.  Per EMS report call came in at 1035.  Is unclear how long she was unresponsive.  Her mother was reported to be there and there is no report of seizure activity, loss of bowel or bladder control.  On EMS arrival she was beginning to speak to them but spoke very quietly.  She had no other complaints prehospital blood sugar was 95    Prior to Admission medications   Medication Sig Start Date End Date Taking? Authorizing Provider  potassium chloride  SA (KLOR-CON  M) 20 MEQ tablet Take 1 tablet (20 mEq total) by mouth 2 (two) times daily. 07/21/24  Yes Levander Houston, MD  albuterol  (PROVENTIL  HFA;VENTOLIN  HFA) 108 (90 Base) MCG/ACT inhaler Inhale 2 puffs into the lungs every 4 (four) hours as needed for wheezing or shortness of breath. 10/09/18   Freddi Hamilton, MD  calcium-vitamin D  (OSCAL WITH D) 500-200 MG-UNIT TABS tablet Take 1 tablet by mouth daily.    [provider]  Cholecalciferol  (VITAMIN D ) 50 MCG (2000 UT) tablet Take 2,000 Units by mouth daily.    [provider]  citalopram  (CELEXA ) 40 MG tablet Take 1 tablet (40 mg total) by mouth daily. 08/27/19   Vincente Murrain, MD  Ipratropium-Albuterol  (COMBIVENT) 20-100 MCG/ACT AERS respimat Inhale 1 puff into the lungs every 6 (six) hours. 05/07/21   Nche, Roselie Rockford, NP  Levonorgestrel-Ethinyl Estradiol (AMETHIA) 0.15-0.03 &0.01 MG tablet Take 1 tablet by mouth daily. 08/24/21   Nche, Roselie Rockford, NP  Multiple Vitamin (MULTI-VITAMIN) tablet Take 1 tablet by mouth daily.    [provider]  Potassium Chloride  ER 20 MEQ TBCR Take 20 mEq by mouth 2 (two) times daily.  04/30/22   Nche, Roselie Rockford, NP  Vitamin D , Ergocalciferol , (DRISDOL ) 1.25 MG (50000 UNIT) CAPS capsule Take 1 capsule (50,000 Units total) by mouth every 7 (seven) days. 04/30/22   Nche, Roselie Rockford, NP    Allergies: Prednisone     Review of Systems  Updated Vital Signs BP 116/74   Pulse (!) 111   Temp 98.1 F (36.7 C)   Resp (!) 23   Ht 1.651 m (5' 5)   Wt 104.3 kg   SpO2 100%   BMI 38.27 kg/m   Physical Exam Vitals reviewed.  Constitutional:      General: She is not in acute distress.    Appearance: She is obese.  HENT:     Head: Normocephalic.     Right Ear: External ear normal.     Left Ear: External ear normal.     Nose: Nose normal.     Mouth/Throat:     Pharynx: Oropharynx is clear.  Eyes:     Extraocular Movements: Extraocular movements intact.     Pupils: Pupils are equal, round, and reactive to light.  Cardiovascular:     Rate and Rhythm: Normal rate and regular rhythm.     Pulses: Normal pulses.     Heart sounds: Normal heart sounds.  Pulmonary:     Effort: Pulmonary effort is normal.     Breath sounds: Normal breath sounds.  Abdominal:  General: Abdomen is flat. Bowel sounds are normal.     Palpations: Abdomen is soft.  Musculoskeletal:     Cervical back: Normal range of motion.     Comments: Patient complains of tenderness palpation bilateral thighs no obvious external signs of trauma Unable to have active range of motion of the legs complaining of pain in legs Passive range of motion performed with no obvious deformities  Skin:    General: Skin is warm and dry.     Capillary Refill: Capillary refill takes less than 2 seconds.  Neurological:     Comments: Patient with a symmetrical face at rest but with movement appears symmetrical Extraocular movements are intact She has bilateral drift of her arms She is unable to hold her legs up against gravity  Psychiatric:        Mood and Affect: Mood normal.     (all labs ordered are listed,  but only abnormal results are displayed) Labs Reviewed  CBC - Abnormal; Notable for the following components:      Result Value   Hemoglobin 11.3 (*)    HCT 35.2 (*)    Platelets 422 (*)    All other components within normal limits  COMPREHENSIVE METABOLIC PANEL WITH GFR - Abnormal; Notable for the following components:   Potassium 3.3 (*)    CO2 20 (*)    Calcium 8.7 (*)    Albumin 3.4 (*)    All other components within normal limits  I-STAT CHEM 8, ED - Abnormal; Notable for the following components:   Potassium 3.2 (*)    BUN 5 (*)    Calcium, Ion 1.13 (*)    TCO2 21 (*)    Hemoglobin 11.9 (*)    HCT 35.0 (*)    All other components within normal limits  CK  CBG MONITORING, ED  POC URINE PREG, ED    EKG: None  Radiology: CT Lumbar Spine Wo Contrast Result Date: 07/21/2024 CLINICAL DATA:  Low back pain, cauda equina syndrome suspected; Mid-back pain, constant or radicular pain, neg xray (Ped 0-17y) EXAM: CT THORACIC AND LUMBAR SPINE WITHOUT CONTRAST TECHNIQUE: Multidetector CT imaging of the thoracic and lumbar spine was performed without contrast. Multiplanar CT image reconstructions were also generated. RADIATION DOSE REDUCTION: This exam was performed according to the departmental dose-optimization program which includes automated exposure control, adjustment of the mA and/or kV according to patient size and/or use of iterative reconstruction technique. COMPARISON:  None Available. FINDINGS: CT THORACIC SPINE FINDINGS Alignment: Normal. Vertebrae: Multilevel mild degenerative changes of the spine. No acute fracture or focal pathologic process. Paraspinal and other soft tissues: Negative. Disc levels: Maintained. CT LUMBAR SPINE FINDINGS Segmentation: 5 lumbar type vertebrae. Alignment: Normal. Vertebrae: No acute fracture or focal pathologic process. Paraspinal and other soft tissues: Negative. Disc levels: Maintained. Other: IMPRESSION: CT THORACIC SPINE IMPRESSION No acute  displaced fracture or traumatic listhesis of the thoracic spine. CT LUMBAR SPINE IMPRESSION No acute displaced fracture or traumatic listhesis of the lumbar spine. Electronically Signed   By: Morgane  Naveau M.D.   On: 07/21/2024 13:14   CT Thoracic Spine Wo Contrast Result Date: 07/21/2024 CLINICAL DATA:  Low back pain, cauda equina syndrome suspected; Mid-back pain, constant or radicular pain, neg xray (Ped 0-17y) EXAM: CT THORACIC AND LUMBAR SPINE WITHOUT CONTRAST TECHNIQUE: Multidetector CT imaging of the thoracic and lumbar spine was performed without contrast. Multiplanar CT image reconstructions were also generated. RADIATION DOSE REDUCTION: This exam was performed according to the departmental dose-optimization  program which includes automated exposure control, adjustment of the mA and/or kV according to patient size and/or use of iterative reconstruction technique. COMPARISON:  None Available. FINDINGS: CT THORACIC SPINE FINDINGS Alignment: Normal. Vertebrae: Multilevel mild degenerative changes of the spine. No acute fracture or focal pathologic process. Paraspinal and other soft tissues: Negative. Disc levels: Maintained. CT LUMBAR SPINE FINDINGS Segmentation: 5 lumbar type vertebrae. Alignment: Normal. Vertebrae: No acute fracture or focal pathologic process. Paraspinal and other soft tissues: Negative. Disc levels: Maintained. Other: IMPRESSION: CT THORACIC SPINE IMPRESSION No acute displaced fracture or traumatic listhesis of the thoracic spine. CT LUMBAR SPINE IMPRESSION No acute displaced fracture or traumatic listhesis of the lumbar spine. Electronically Signed   By: Morgane  Naveau M.D.   On: 07/21/2024 13:14   CT Head Wo Contrast Result Date: 07/21/2024 CLINICAL DATA:  Neuro deficit, acute, stroke suspected Patient presents to the ED via [facility] , staes she was in the kitchen talking to her mother , mother states she just stopped talking and felll to the floor, patient doesn't remember event.  C/o not feeling well for several days. EXAM: CT HEAD WITHOUT CONTRAST TECHNIQUE: Contiguous axial images were obtained from the base of the skull through the vertex without intravenous contrast. RADIATION DOSE REDUCTION: This exam was performed according to the departmental dose-optimization program which includes automated exposure control, adjustment of the mA and/or kV according to patient size and/or use of iterative reconstruction technique. COMPARISON:  CT head 06/01/2018 FINDINGS: Brain: No evidence of large-territorial acute infarction. No parenchymal hemorrhage. No mass lesion. No extra-axial collection. No mass effect or midline shift. No hydrocephalus. Basilar cisterns are patent. Vascular: No hyperdense vessel. Skull: No acute fracture or focal lesion. Sinuses/Orbits: Paranasal sinuses and mastoid air cells are clear. The orbits are unremarkable. Other: None. IMPRESSION: No acute intracranial abnormality. Electronically Signed   By: Morgane  Naveau M.D.   On: 07/21/2024 12:55     .Critical Care  Performed by: Levander Houston, MD Authorized by: Levander Houston, MD   Critical care provider statement:    Critical care time (minutes):  60   Critical care end time:  07/21/2024 4:35 PM   Critical care time was exclusive of:  Separately billable procedures and treating other patients and teaching time   Critical care was necessary to treat or prevent imminent or life-threatening deterioration of the following conditions:  CNS failure or compromise   Critical care was time spent personally by me on the following activities:  Evaluation of patient's response to treatment, examination of patient, ordering and performing treatments and interventions, ordering and review of laboratory studies, pulse oximetry and re-evaluation of patient's condition    Medications Ordered in the ED  potassium chloride  SA (KLOR-CON  M) CR tablet 40 mEq (40 mEq Oral Given 07/21/24 1454)  lactated ringers  bolus 1,000 mL (0  mLs Intravenous Stopped 07/21/24 1547)    Clinical Course as of 07/21/24 1635  Sat Jul 21, 2024  1349 CT lumbar spine without acute displaced fracture or traumatic listhesis CT thoracic spine without acute displaced fracture or traumatic listhesis [DR]  1350 CT head without acute intracranial abnormality noted [DR]  1350 CBC with mild anemia hemoglobin 11.3 stable from first prior 1 month ago [DR]  1350 Complete metabolic panel with mild hypokalemia [DR]  1355 Patient reevaluated.  She is complaining of pain everywhere.  She states that she thinks this is because she moved into the second-floor apartment by herself yesterday.  Total CK, potassium, and IV fluids ordered [DR]  Clinical Course User Index [DR] Levander Houston, MD                                 Medical Decision Making Amount and/or Complexity of Data Reviewed Labs: ordered. Radiology: ordered.  Risk Prescription drug management.   33 year old female female with onset of weakness.  Weakness was generalized.  She reports that she has had increased exertion over the past 24 hours.  Patient was evaluated here with physical exam.  No focal neurological deficits were noted Differential diagnosis includes but is not limited to stroke, hypoglycemia, electrolyte abnormality, tachycardia, anemia. Patient evaluated with CT that shows no evidence of acute intracranial abnormality Patient evaluated with complete metabolic panel which is significant for decreased potassium at 3.3 Total CK obtained at 163 CBC obtained with no evidence of anemia and some elevated platelets at 422,000. Patient hydrated here with a liter of LR Patient had potassium repleted Patient symptomatically improved with no further weakness and pain has resolved. Suspect symptoms secondary to hypokalemia. Patient advised of return precautions and need for close follow-up with voices understanding     Final diagnoses:  Weakness  Hypokalemia    ED  Discharge Orders          Ordered    potassium chloride  SA (KLOR-CON  M) 20 MEQ tablet  2 times daily        07/21/24 1634               Levander Houston, MD 07/21/24 1635

## 2024-07-21 NOTE — Discharge Instructions (Signed)
 Please take potassium as prescribed.  Drink plenty of fluids.  Recheck with your doctor next week and return if you are worse at any time

## 2024-08-02 DIAGNOSIS — F332 Major depressive disorder, recurrent severe without psychotic features: Secondary | ICD-10-CM | POA: Diagnosis not present

## 2024-08-02 DIAGNOSIS — F411 Generalized anxiety disorder: Secondary | ICD-10-CM | POA: Diagnosis not present

## 2024-08-09 DIAGNOSIS — F332 Major depressive disorder, recurrent severe without psychotic features: Secondary | ICD-10-CM | POA: Diagnosis not present

## 2024-08-09 DIAGNOSIS — F411 Generalized anxiety disorder: Secondary | ICD-10-CM | POA: Diagnosis not present

## 2024-08-16 DIAGNOSIS — F332 Major depressive disorder, recurrent severe without psychotic features: Secondary | ICD-10-CM | POA: Diagnosis not present

## 2024-08-16 DIAGNOSIS — F411 Generalized anxiety disorder: Secondary | ICD-10-CM | POA: Diagnosis not present

## 2024-08-23 DIAGNOSIS — F411 Generalized anxiety disorder: Secondary | ICD-10-CM | POA: Diagnosis not present

## 2024-08-23 DIAGNOSIS — F332 Major depressive disorder, recurrent severe without psychotic features: Secondary | ICD-10-CM | POA: Diagnosis not present

## 2024-08-30 DIAGNOSIS — F411 Generalized anxiety disorder: Secondary | ICD-10-CM | POA: Diagnosis not present

## 2024-08-30 DIAGNOSIS — F332 Major depressive disorder, recurrent severe without psychotic features: Secondary | ICD-10-CM | POA: Diagnosis not present

## 2024-09-06 DIAGNOSIS — F332 Major depressive disorder, recurrent severe without psychotic features: Secondary | ICD-10-CM | POA: Diagnosis not present

## 2024-09-06 DIAGNOSIS — F411 Generalized anxiety disorder: Secondary | ICD-10-CM | POA: Diagnosis not present

## 2024-09-20 DIAGNOSIS — F332 Major depressive disorder, recurrent severe without psychotic features: Secondary | ICD-10-CM | POA: Diagnosis not present

## 2024-09-20 DIAGNOSIS — F411 Generalized anxiety disorder: Secondary | ICD-10-CM | POA: Diagnosis not present

## 2024-10-04 DIAGNOSIS — F332 Major depressive disorder, recurrent severe without psychotic features: Secondary | ICD-10-CM | POA: Diagnosis not present

## 2024-10-04 DIAGNOSIS — F411 Generalized anxiety disorder: Secondary | ICD-10-CM | POA: Diagnosis not present

## 2024-11-06 ENCOUNTER — Ambulatory Visit: Payer: Self-pay | Admitting: *Deleted

## 2024-11-06 NOTE — Telephone Encounter (Signed)
 Pt sch to see Lauren on 12/14/2024 at 1:20pm

## 2024-11-06 NOTE — Telephone Encounter (Signed)
 FYI Only or Action Required?: FYI only for provider: ED advised and last OV 08/20/21- request for new referral .  Patient was last seen in primary care on 08/20/21  Called Nurse Triage reporting colitis flare.  Symptoms began several years ago.  Interventions attempted: OTC medications: Probiotic.  Symptoms are: gradually worsening.  Triage Disposition: Go to ED Now (or PCP Triage)  Patient/caregiver understands and will follow disposition?: Yes  Message from Deaijah H sent at 11/06/2024  8:46 AM EST  Reason for Triage: current Ulcerative Flare up ; constant diarrhea, nausea, fatigue, constant abdominal pain , lost a significant amount of weight, vomiting. Requesting to see PCP for flare up for referral   Reason for Disposition  [1] Drinking very little AND [2] dehydration suspected (e.g., no urine > 12 hours, very dry mouth, very lightheaded)  Answer Assessment - Initial Assessment Questions Patient states she has been having colitis flare for extended time without treatment. Patient states she was released from [facility] and needs referral to [facility]- her previous referral has expired. Patient states she is constantly having diarrhea, vomiting, abdominal cramping, weight loss.Patient is having flare now- advised ED for treatment due to her symptoms.     1. DIARRHEA SEVERITY: How bad is the diarrhea? How many more stools have you had in the past 24 hours than normal?      Severe- 15 2. ONSET: When did the diarrhea begin?      12/2023- continued, patient has lost weight, tried to refuel- liquids, rest, co-biotics not helping 3. STOOL DESCRIPTION:  How loose or watery is the diarrhea? What is the stool color? Is there any blood or mucous in the stool?     Watery stool 4. VOMITING: Are you also vomiting? If Yes, ask: How many times in the past 24 hours?      Only vomiting twice- has had nausea 5. ABDOMEN PAIN: Are you having any abdomen pain? If Yes, ask: What does it feel  like? (e.g., crampy, dull, intermittent, constant)      Cramping that comes and goes 6. ABDOMEN PAIN SEVERITY: If present, ask: How bad is the pain?  (e.g., Scale 1-10; mild, moderate, or severe)     Can be severe 7. ORAL INTAKE: If vomiting, Have you been able to drink liquids? How much liquids have you had in the past 24 hours?     No, tried to take in daily liquids- but it comes out 8. HYDRATION: Any signs of dehydration? (e.g., dry mouth [not just dry lips], too weak to stand, dizziness, new weight loss) When did you last urinate?     Weight loss, yesterday did have severe weakness 9. EXPOSURE: Have you traveled to a foreign country recently? Have you been exposed to anyone with diarrhea? Could you have eaten any food that was spoiled?     no 10. ANTIBIOTIC USE: Are you taking antibiotics now or have you taken antibiotics in the past 2 months?       Only probiotic- using tru 11. OTHER SYMPTOMS: Do you have any other symptoms? (e.g., fever, blood in stool)       Weight loose  Protocols used: Diarrhea-A-AH

## 2024-12-14 ENCOUNTER — Ambulatory Visit: Admitting: Nurse Practitioner
